# Patient Record
Sex: Male | Born: 1935 | Race: White | Hispanic: No | Smoking: Former smoker
Health system: Southern US, Community
[De-identification: ages and names within clinical notes are randomized; demographics above are authoritative.]

## PROBLEM LIST (undated history)

## (undated) DIAGNOSIS — Z86718 Personal history of other venous thrombosis and embolism: Secondary | ICD-10-CM

## (undated) DIAGNOSIS — I714 Abdominal aortic aneurysm, without rupture, unspecified: Secondary | ICD-10-CM

## (undated) DIAGNOSIS — Z7901 Long term (current) use of anticoagulants: Secondary | ICD-10-CM

## (undated) DIAGNOSIS — M1990 Unspecified osteoarthritis, unspecified site: Secondary | ICD-10-CM

## (undated) DIAGNOSIS — I639 Cerebral infarction, unspecified: Secondary | ICD-10-CM

## (undated) DIAGNOSIS — I4891 Unspecified atrial fibrillation: Secondary | ICD-10-CM

## (undated) DIAGNOSIS — I10 Essential (primary) hypertension: Secondary | ICD-10-CM

## (undated) DIAGNOSIS — K589 Irritable bowel syndrome without diarrhea: Secondary | ICD-10-CM

## (undated) DIAGNOSIS — D649 Anemia, unspecified: Secondary | ICD-10-CM

## (undated) DIAGNOSIS — G459 Transient cerebral ischemic attack, unspecified: Secondary | ICD-10-CM

## (undated) DIAGNOSIS — N200 Calculus of kidney: Secondary | ICD-10-CM

## (undated) DIAGNOSIS — I209 Angina pectoris, unspecified: Secondary | ICD-10-CM

## (undated) DIAGNOSIS — Z923 Personal history of irradiation: Secondary | ICD-10-CM

## (undated) DIAGNOSIS — Z860100 Personal history of colon polyps, unspecified: Secondary | ICD-10-CM

## (undated) DIAGNOSIS — I2510 Atherosclerotic heart disease of native coronary artery without angina pectoris: Secondary | ICD-10-CM

## (undated) DIAGNOSIS — Z8719 Personal history of other diseases of the digestive system: Secondary | ICD-10-CM

## (undated) DIAGNOSIS — N400 Enlarged prostate without lower urinary tract symptoms: Secondary | ICD-10-CM

## (undated) DIAGNOSIS — C439 Malignant melanoma of skin, unspecified: Secondary | ICD-10-CM

## (undated) DIAGNOSIS — F419 Anxiety disorder, unspecified: Secondary | ICD-10-CM

## (undated) DIAGNOSIS — Z8489 Family history of other specified conditions: Secondary | ICD-10-CM

## (undated) DIAGNOSIS — Z9981 Dependence on supplemental oxygen: Secondary | ICD-10-CM

## (undated) DIAGNOSIS — Y92009 Unspecified place in unspecified non-institutional (private) residence as the place of occurrence of the external cause: Secondary | ICD-10-CM

## (undated) DIAGNOSIS — J449 Chronic obstructive pulmonary disease, unspecified: Secondary | ICD-10-CM

## (undated) DIAGNOSIS — W19XXXA Unspecified fall, initial encounter: Secondary | ICD-10-CM

## (undated) DIAGNOSIS — C799 Secondary malignant neoplasm of unspecified site: Secondary | ICD-10-CM

## (undated) DIAGNOSIS — I509 Heart failure, unspecified: Secondary | ICD-10-CM

## (undated) DIAGNOSIS — K219 Gastro-esophageal reflux disease without esophagitis: Secondary | ICD-10-CM

## (undated) DIAGNOSIS — E785 Hyperlipidemia, unspecified: Secondary | ICD-10-CM

## (undated) DIAGNOSIS — IMO0002 Reserved for concepts with insufficient information to code with codable children: Secondary | ICD-10-CM

## (undated) HISTORY — DX: Hyperlipidemia, unspecified: E78.5

## (undated) HISTORY — DX: Reserved for concepts with insufficient information to code with codable children: IMO0002

## (undated) HISTORY — PX: LYMPH NODE BIOPSY: SHX201

## (undated) HISTORY — PX: JOINT REPLACEMENT: SHX530

## (undated) HISTORY — PX: MELANOMA EXCISION: SHX5266

## (undated) HISTORY — PX: SHOULDER OPEN ROTATOR CUFF REPAIR: SHX2407

## (undated) HISTORY — DX: Long term (current) use of anticoagulants: Z79.01

## (undated) HISTORY — DX: Heart failure, unspecified: I50.9

## (undated) HISTORY — DX: Anemia, unspecified: D64.9

## (undated) HISTORY — DX: Anxiety disorder, unspecified: F41.9

## (undated) HISTORY — DX: Essential (primary) hypertension: I10

## (undated) HISTORY — DX: Abdominal aortic aneurysm, without rupture, unspecified: I71.40

## (undated) HISTORY — PX: KIDNEY STONE SURGERY: SHX686

## (undated) HISTORY — DX: Irritable bowel syndrome, unspecified: K58.9

## (undated) HISTORY — DX: Benign prostatic hyperplasia without lower urinary tract symptoms: N40.0

## (undated) HISTORY — DX: Unspecified fall, initial encounter: W19.XXXA

## (undated) HISTORY — DX: Atherosclerotic heart disease of native coronary artery without angina pectoris: I25.10

## (undated) HISTORY — DX: Unspecified osteoarthritis, unspecified site: M19.90

## (undated) HISTORY — PX: CYSTOSCOPY W/ STONE MANIPULATION: SHX1427

## (undated) HISTORY — DX: Personal history of colonic polyps: Z86.010

## (undated) HISTORY — DX: Personal history of colon polyps, unspecified: Z86.0100

## (undated) HISTORY — DX: Unspecified atrial fibrillation: I48.91

## (undated) HISTORY — DX: Personal history of other venous thrombosis and embolism: Z86.718

## (undated) HISTORY — DX: Abdominal aortic aneurysm, without rupture: I71.4

## (undated) HISTORY — DX: Unspecified place in unspecified non-institutional (private) residence as the place of occurrence of the external cause: Y92.009

---
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
## 1971-04-09 HISTORY — PX: CHOLECYSTECTOMY: SHX55

## 1997-04-08 HISTORY — PX: CORONARY ARTERY BYPASS GRAFT: SHX141

## 1998-01-31 ENCOUNTER — Encounter: Payer: Self-pay | Admitting: Urology

## 1998-02-02 ENCOUNTER — Ambulatory Visit (HOSPITAL_COMMUNITY): Admission: RE | Admit: 1998-02-02 | Discharge: 1998-02-02 | Payer: Self-pay | Admitting: Urology

## 1998-02-02 ENCOUNTER — Encounter: Payer: Self-pay | Admitting: Urology

## 1998-02-02 ENCOUNTER — Inpatient Hospital Stay (HOSPITAL_COMMUNITY): Admission: EM | Admit: 1998-02-02 | Discharge: 1998-02-03 | Payer: Self-pay | Admitting: Emergency Medicine

## 1998-02-03 ENCOUNTER — Encounter: Payer: Self-pay | Admitting: Cardiology

## 1998-02-07 ENCOUNTER — Inpatient Hospital Stay (HOSPITAL_COMMUNITY): Admission: RE | Admit: 1998-02-07 | Discharge: 1998-02-15 | Payer: Self-pay

## 1998-02-10 ENCOUNTER — Encounter: Payer: Self-pay | Admitting: Surgery

## 1998-02-11 ENCOUNTER — Encounter: Payer: Self-pay | Admitting: Surgery

## 1998-02-12 ENCOUNTER — Encounter: Payer: Self-pay | Admitting: Surgery

## 1998-02-16 ENCOUNTER — Encounter: Payer: Self-pay | Admitting: Thoracic Surgery (Cardiothoracic Vascular Surgery)

## 1998-02-16 ENCOUNTER — Inpatient Hospital Stay (HOSPITAL_COMMUNITY): Admission: EM | Admit: 1998-02-16 | Discharge: 1998-02-21 | Payer: Self-pay | Admitting: Emergency Medicine

## 1998-06-15 ENCOUNTER — Encounter: Payer: Self-pay | Admitting: Urology

## 1998-06-15 ENCOUNTER — Ambulatory Visit (HOSPITAL_COMMUNITY): Admission: RE | Admit: 1998-06-15 | Discharge: 1998-06-15 | Payer: Self-pay | Admitting: Urology

## 1999-09-28 ENCOUNTER — Emergency Department (HOSPITAL_COMMUNITY): Admission: EM | Admit: 1999-09-28 | Discharge: 1999-09-28 | Payer: Self-pay | Admitting: Emergency Medicine

## 1999-09-28 ENCOUNTER — Encounter: Payer: Self-pay | Admitting: Emergency Medicine

## 2000-08-15 ENCOUNTER — Encounter: Payer: Self-pay | Admitting: Emergency Medicine

## 2000-08-15 ENCOUNTER — Emergency Department (HOSPITAL_COMMUNITY): Admission: EM | Admit: 2000-08-15 | Discharge: 2000-08-15 | Payer: Self-pay | Admitting: Emergency Medicine

## 2000-11-08 ENCOUNTER — Encounter: Payer: Self-pay | Admitting: *Deleted

## 2000-11-08 ENCOUNTER — Inpatient Hospital Stay (HOSPITAL_COMMUNITY): Admission: EM | Admit: 2000-11-08 | Discharge: 2000-11-10 | Payer: Self-pay | Admitting: *Deleted

## 2001-01-13 ENCOUNTER — Encounter: Admission: RE | Admit: 2001-01-13 | Discharge: 2001-01-13 | Payer: Self-pay | Admitting: Family Medicine

## 2001-01-13 ENCOUNTER — Encounter: Payer: Self-pay | Admitting: Family Medicine

## 2001-06-03 ENCOUNTER — Encounter (INDEPENDENT_AMBULATORY_CARE_PROVIDER_SITE_OTHER): Payer: Self-pay | Admitting: Specialist

## 2001-06-03 ENCOUNTER — Inpatient Hospital Stay (HOSPITAL_COMMUNITY): Admission: EM | Admit: 2001-06-03 | Discharge: 2001-06-06 | Payer: Self-pay | Admitting: *Deleted

## 2001-06-06 ENCOUNTER — Encounter: Payer: Self-pay | Admitting: Gastroenterology

## 2001-07-13 ENCOUNTER — Encounter: Admission: RE | Admit: 2001-07-13 | Discharge: 2001-07-13 | Payer: Self-pay | Admitting: Family Medicine

## 2001-07-13 ENCOUNTER — Encounter: Payer: Self-pay | Admitting: Family Medicine

## 2001-08-20 ENCOUNTER — Observation Stay (HOSPITAL_COMMUNITY): Admission: RE | Admit: 2001-08-20 | Discharge: 2001-08-21 | Payer: Self-pay | Admitting: Orthopedic Surgery

## 2002-05-09 ENCOUNTER — Emergency Department (HOSPITAL_COMMUNITY): Admission: EM | Admit: 2002-05-09 | Discharge: 2002-05-09 | Payer: Self-pay | Admitting: Emergency Medicine

## 2002-05-09 ENCOUNTER — Encounter: Payer: Self-pay | Admitting: Emergency Medicine

## 2003-01-11 ENCOUNTER — Ambulatory Visit (HOSPITAL_COMMUNITY): Admission: RE | Admit: 2003-01-11 | Discharge: 2003-01-11 | Payer: Self-pay | Admitting: Cardiology

## 2003-01-11 ENCOUNTER — Encounter (INDEPENDENT_AMBULATORY_CARE_PROVIDER_SITE_OTHER): Payer: Self-pay | Admitting: Cardiology

## 2003-07-15 ENCOUNTER — Emergency Department (HOSPITAL_COMMUNITY): Admission: EM | Admit: 2003-07-15 | Discharge: 2003-07-15 | Payer: Self-pay | Admitting: *Deleted

## 2004-08-17 ENCOUNTER — Encounter: Admission: RE | Admit: 2004-08-17 | Discharge: 2004-08-17 | Payer: Self-pay | Admitting: Family Medicine

## 2004-08-29 ENCOUNTER — Encounter: Admission: RE | Admit: 2004-08-29 | Discharge: 2004-08-29 | Payer: Self-pay | Admitting: Family Medicine

## 2004-09-20 ENCOUNTER — Encounter: Admission: RE | Admit: 2004-09-20 | Discharge: 2004-09-20 | Payer: Self-pay | Admitting: Family Medicine

## 2004-12-21 ENCOUNTER — Encounter: Admission: RE | Admit: 2004-12-21 | Discharge: 2004-12-21 | Payer: Self-pay | Admitting: Family Medicine

## 2005-02-24 ENCOUNTER — Emergency Department (HOSPITAL_COMMUNITY): Admission: EM | Admit: 2005-02-24 | Discharge: 2005-02-24 | Payer: Self-pay | Admitting: Emergency Medicine

## 2005-07-27 ENCOUNTER — Encounter: Admission: RE | Admit: 2005-07-27 | Discharge: 2005-07-27 | Payer: Self-pay | Admitting: Rheumatology

## 2006-06-12 ENCOUNTER — Ambulatory Visit (HOSPITAL_COMMUNITY): Admission: RE | Admit: 2006-06-12 | Discharge: 2006-06-12 | Payer: Self-pay | Admitting: Urology

## 2007-12-15 ENCOUNTER — Ambulatory Visit: Payer: Self-pay | Admitting: Gastroenterology

## 2007-12-15 ENCOUNTER — Encounter (INDEPENDENT_AMBULATORY_CARE_PROVIDER_SITE_OTHER): Payer: Self-pay | Admitting: *Deleted

## 2007-12-15 DIAGNOSIS — R159 Full incontinence of feces: Secondary | ICD-10-CM | POA: Insufficient documentation

## 2007-12-15 DIAGNOSIS — L259 Unspecified contact dermatitis, unspecified cause: Secondary | ICD-10-CM

## 2007-12-15 DIAGNOSIS — Z860100 Personal history of colon polyps, unspecified: Secondary | ICD-10-CM | POA: Insufficient documentation

## 2007-12-16 ENCOUNTER — Encounter: Payer: Self-pay | Admitting: Gastroenterology

## 2007-12-18 ENCOUNTER — Ambulatory Visit (HOSPITAL_COMMUNITY): Admission: RE | Admit: 2007-12-18 | Discharge: 2007-12-18 | Payer: Self-pay | Admitting: Gastroenterology

## 2007-12-23 ENCOUNTER — Encounter: Payer: Self-pay | Admitting: Gastroenterology

## 2007-12-23 ENCOUNTER — Ambulatory Visit: Payer: Self-pay | Admitting: Gastroenterology

## 2007-12-27 ENCOUNTER — Encounter: Payer: Self-pay | Admitting: Gastroenterology

## 2008-01-20 ENCOUNTER — Ambulatory Visit: Payer: Self-pay | Admitting: Gastroenterology

## 2008-08-01 ENCOUNTER — Ambulatory Visit: Payer: Self-pay | Admitting: Gastroenterology

## 2009-04-08 HISTORY — PX: ANKLE FUSION: SHX881

## 2009-04-08 HISTORY — PX: TIBIA FRACTURE SURGERY: SHX806

## 2010-02-07 ENCOUNTER — Observation Stay (HOSPITAL_COMMUNITY): Admission: RE | Admit: 2010-02-07 | Discharge: 2010-02-09 | Payer: Self-pay | Admitting: Orthopedic Surgery

## 2010-04-29 ENCOUNTER — Encounter: Payer: Self-pay | Admitting: Family Medicine

## 2010-06-19 LAB — PROTIME-INR
INR: 2.37 — ABNORMAL HIGH (ref 0.00–1.49)
INR: 2.61 — ABNORMAL HIGH (ref 0.00–1.49)
INR: 3 — ABNORMAL HIGH (ref 0.00–1.49)
Prothrombin Time: 26 seconds — ABNORMAL HIGH (ref 11.6–15.2)
Prothrombin Time: 28 seconds — ABNORMAL HIGH (ref 11.6–15.2)
Prothrombin Time: 31.2 seconds — ABNORMAL HIGH (ref 11.6–15.2)

## 2010-06-20 LAB — APTT: aPTT: 40 seconds — ABNORMAL HIGH (ref 24–37)

## 2010-06-20 LAB — COMPREHENSIVE METABOLIC PANEL
ALT: 16 U/L (ref 0–53)
AST: 22 U/L (ref 0–37)
Albumin: 4.2 g/dL (ref 3.5–5.2)
Alkaline Phosphatase: 65 U/L (ref 39–117)
BUN: 17 mg/dL (ref 6–23)
CO2: 32 mEq/L (ref 19–32)
Calcium: 10 mg/dL (ref 8.4–10.5)
Chloride: 98 mEq/L (ref 96–112)
Creatinine, Ser: 1.37 mg/dL (ref 0.4–1.5)
GFR calc Af Amer: >60 mL/min (ref >60)
GFR calc non Af Amer: 50 mL/min — ABNORMAL LOW (ref >60)
Glucose, Bld: 98 mg/dL (ref 70–99)
Potassium: 3.7 mEq/L (ref 3.5–5.1)
Sodium: 139 mEq/L (ref 135–145)
Total Bilirubin: 0.9 mg/dL (ref 0.3–1.2)
Total Protein: 6.8 g/dL (ref 6.0–8.3)

## 2010-06-20 LAB — CBC
HCT: 50.5 % (ref 39.0–52.0)
Hemoglobin: 17.8 g/dL — ABNORMAL HIGH (ref 13.0–17.0)
MCH: 33.9 pg (ref 26.0–34.0)
MCHC: 35.2 g/dL (ref 30.0–36.0)
MCV: 96.2 fL (ref 78.0–100.0)
Platelets: 173 10*3/uL (ref 150–400)
RBC: 5.25 MIL/uL (ref 4.22–5.81)
RDW: 14.4 % (ref 11.5–15.5)
WBC: 8 10*3/uL (ref 4.0–10.5)

## 2010-06-20 LAB — PROTIME-INR
INR: 2.2 — ABNORMAL HIGH (ref 0.00–1.49)
Prothrombin Time: 24.6 seconds — ABNORMAL HIGH (ref 11.6–15.2)

## 2010-06-20 LAB — SURGICAL PCR SCREEN
MRSA, PCR: NEGATIVE
Staphylococcus aureus: NEGATIVE

## 2010-08-24 NOTE — Discharge Summary (Signed)
. [facility]  Patient:    Gabriel Wood, Gabriel Wood                   MRN: [id number] Adm. Date:  [id number] Disc. Date: [id number] Attending:  Norman Clay CC:         Abran Cantor Clovis Riley, MD   Discharge Summary  FINAL DIAGNOSES: 1. Paroxysmal atrial fibrillation - resolved. 2. Sick sinus syndrome. 3. Coronary artery disease with previous coronary artery bypass grafting. 4. Hypertension. 5. Renal stones.  HISTORY OF PRESENT ILLNESS:  The patient is a 76 year old male with known coronary artery disease and previous paroxysmal atrial fibrillation.  He presented to the emergency room with anterior chest pain and irregular heartbeat.  When in the emergency room he converted to normal sinus rhythm after a very prolonged pause, he was severely bradycardic with some junctional rhythm following that.  This episode had its onset about two hours after he had taken his dosages of Toprol and Lanoxin.  HOSPITAL COURSE:  Lab data on admission showed normal CPK and troponins and negative MBs.  Hemoglobin is 17 and hematocrit 49.3.  Potassium is 3.6, BUN 18, creatinine 1.4.  TSH is 1.439, digoxin level 0.5.  12-lead ECG showed atrial fibrillation, nonspecific changes on admission.  Repeat EKG showed sinus bradycardia and first degree AV block.  The patient was admitted and his Toprol and Lanoxin were withheld.  He was begun on Coumadin because of his atrial fibrillation and will be titrated as an outpatient to maintain an INR of between 2 and 3.  His heartrate was monitored and it did come up into the 50s off of medication.  It is thought that he probably also has some sick sinus syndrome.  He is discharged in improved condition on Lipitor 20 mg daily, Norvasc 5 mg daily, Maxzide 25 one daily, baby aspirin daily, and Coumadin 5 mg daily.  He is to be seen for an appointment for a pro-time on Friday and is to have weekly pro times until his INR was between 2  and 3.  If he develops recurrent atrial fibrillation in the future, he possibly could need pacemaker placement in order to give antiarrhythmics to give atrial fibrillation. DD:  11/10/00 TD:  11/12/00 Job: [id number]

## 2010-08-24 NOTE — Discharge Summary (Signed)
[place]. [facility]  Patient:    Gabriel Wood, Gabriel Wood Visit Number: [id number] MRN: [id number]          Service Type: MED Location: [phone] Attending Physician:  Norman Clay Dictated by:   Darden Palmer., M.D. Admit Date:  06/03/2001 Discharge Date: 06/06/2001   CC:         Dyanne Carrel, M.D.   Discharge Summary  FINAL DIAGNOSES: 1. Chest pain, possible unstable angina. 2. Known coronary artery disease with previous coronary bypass grafting    atretic mammary artery graft. 3. Atrial fibrillation in sinus rhythm. 4. Slow gastrointestinal bleed. 5. Hypertension. 6. Hyperlipidemia.  PROCEDURES:  Cardiac catheterization.  HISTORY:  The patient presented to the emergency room with chest discomfort of 25 to 30 minutes associated with heaviness.  He has been somewhat weak, nauseated, and bloated, and his hemoglobin was low and Coumadin was stopped. He was admitted to the hospital for further evaluation.  Please see the previously dictated History & Physical for the remainder of the details.  HOSPITAL COURSE:  Lab data showed a hemoglobin of 14.1, hematocrit 42.1. Sodium was 137, potassium 3.5.  PT and PTT were normal.  CPK and troponins were normal.  Urinalysis was unremarkable.  The patients EKG showed no acute disease.  He denied chest pain but had some diaphoresis and exertional dyspnea, significant bloating, and vertigo. Catheterization was advised today with grafts since he had a mildly abnormal Cardiolite previously.  A GI consult was also planned.  The GI consult recommended he have an elective GI workup following catheterization findings.  Cardiac catheterization performed on June 05, 2001, showed normal left ventricular function with increased LVEDP.  Left main was normal.  The LAD had a 95% stenosis proximal to the diagonal with bidirectional distal flow.  The circumflex had an occluded second  marginal.  The right coronary artery had an ostial 70%, was occluded in the mid portion.  The vein graft to the right coronary artery was patent.  The vein graft to the OM-2 was patent with some disease.  The saphenous vein graft to the diagonal was patent with competitive flow into the LAD.  Catheterization findings were reviewed with the patient. He had lost the LIMA graft to the LAD but had good flow through the saphenous vein graft to the diagonal, and it was recommended that he be evaluated with a GI workup.  GI workup did colonoscopy finding two rectal polyps, one friable and the likely source for the heme-positive stools, and two small sigmoid polyps.  EGD was negative.  Plan was to discharge him home, and he went home on June 06, 2001, with hemoccults followed up in one month.  Pathology on the polyps showed an adenomatous polyp in the rectum.  Sigmoid polyps were also adenomatous, and followup was to be in one year.  CONDITION UPON DISCHARGE:  He was discharged home in improved condition.  DISCHARGE MEDICATIONS: 1. Lipitor 20 mg daily. 2. Maxzide 25 daily. 3. Norvasc 5 mg daily. 4. Protonix 40 mg daily.  FOLLOWUP:  He was to see me in the office in one week.  He was to call to get an appointment with Dr. Arlyce Dice in three to four weeks.  He was to call if there were problems.  He was not to use aspirin for two weeks. Dictated by:   Darden Palmer., M.D. Attending Physician:  Norman Clay DD:  06/29/01 TD:  06/30/01 Job:  [id number]

## 2010-08-24 NOTE — H&P (Signed)
[place]. [facility]  Patient:    Gabriel Wood, Gabriel Wood                   MRN: [id number] Adm. Date:  [id number] Attending:  Norman Clay CC:         Abran Cantor Clovis Riley, M.D.   History and Physical  REASON FOR ADMISSION:  Chest discomfort and palpitations.  HISTORY OF PRESENT ILLNESS:  This 77 year old man was admitted for treatment of atrial fibrillation and sick sinus syndrome.  The patient has a previous history of coronary artery disease.  He developed unstable angina pectoris after a lithotripsy and was found to have three-vessel coronary artery disease at catheterization with a 90% LAD, 90% circumflex, and 70% ostial right coronary artery and 60% mid vessel stenosis.  He underwent coronary artery bypass grafting by Dr. Evelene Croon on February 10, 1998, with an internal mammary graft to the LAD, a vein raft to the diagonal branch, a vein graft to the obtuse marginal, and a vein graft to the posterior descending coronary artery. This was after a positive adenosine scan.  Postoperatively, he developed recurrent atrial fibrillation and had to be readmitted on February 16, 1998.  He had failed procainamide and was eventually discharged on Betapace and Coumadin and was able to convert back to normal sinus rhythm. He was on Betapace at that time.  His Betapace was increased to 80 mg 3 times a day, and he was taken off of Betapace around March 2000.  He had had some PVCs and some paroxysmal atrial fibrillation in the past but had improved with that.  He had recently developed some dizziness, memory loss, and was seen by Dr. Manus Gunning or Dr. Clovis Riley on May 13.  He, while driving back to [facility], presented to the emergency room where he had some chest discomfort and numbness and and was started on Norvasc, and he felt well.  An adenosine Cardiolite scan performed on Aug 29, 2000, showed a very small focus of distal anterolateral ischemia with a  normal ejection fraction of 60% and was felt to be a low-risk scan.  He has really been asymptomatic since then.  He had felt well recently and had gone out to play golf this morning.  After returning from a golf match, he had taken his Toprol and digoxin at lunch while sitting in a chair.  He then developed a feeling of flutter in his chest and some pressure-type sensation and was brought to the emergency room.  He was found to be in atrial fibrillation here.  He spontaneously converted to normal sinus rhythm but in the process had a prolonged sinus pause and then some junctional rhythm prior to resuming sinus rhythm.  At the present time he feels well.  He does note some episodic irregularity in his heart beat.  PAST MEDICAL HISTORY:  Remarkable for hypertension.  He has been placed on Norvasc and beta blockers for this.  He has a prior history of irritable bowel syndrome and recurrent renal stones and has had several lithotripsies.  PAST SURGICAL HISTORY:  Cholecystectomy, several operations for renal stones, tonsillectomy, appendectomy, and rotator cuff surgery.  ALLERGIES:  CODEINE:  CURRENT MEDICATIONS: 1. He was on aspirin daily. 2. Lanoxin 0.125 mg daily. 3. Toprol XL 50 mg daily. 4. Lipitor 20 mg daily. 5. Maxzide 25 daily. 6. Norvasc 5 mg daily prior to admission.  FAMILY HISTORY:  Remarkable for cardiac disease.  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  He smoked heavily and quit smoking in December 1998. He drank heavily intermittently but has not had significant alcohol in over 10 years.  REVIEW OF SYSTEMS:  Significant for renal stones and significant arthritis. He has some nocturia and some complaints of irritable bowel syndrome and episodic diarrhea.  The remainder of the Review of Systems is unremarkable except as noted above.  PHYSICAL EXAMINATION:  GENERAL:  A very pleasant, slightly erythematous,  red-faced male in no acute distress.  VITAL SIGNS:  Blood pressure 135/80, pulse 45 and regular.  He was in sinus bradycardia when he was seen.  SKIN:  Warm and dry.  ENT:  EOMI.  PERRLA.  Conjunctivae and sclerae clear.  Pharynx negative.  NECK:  Supple without masses.  No carotid bruits.  LUNGS:  Clear.  CARDIAC:  Normal S1 and S2, no S3.  LYMPH NODES:  Negative.  ABDOMEN:  Obese, soft, and nontender.  EXTREMITIES:  There is no edema noted.  Peripheral pulses were present and were 2+.  LABORATORY DATA:  A 12-lead ECG showed atrial fibrillation with nonspecific ST and T wave changes.  A repeat EKG showed a junctional rhythm at a rate of 42. He subsequently has been noted to be in normal sinus rhythm.  Initial cardiac enzymes were unremarkable.  IMPRESSION: 1. Paroxysmal atrial fibrillation, resolved, with evidence of sick sinus    syndrome. 2. Coronary artery disease with previous coronary bypass grafting. 3. Hypertension.  RECOMMENDATIONS:  Brought in to monitor bradycardia.  His Toprol and digoxin will be withheld, and he will have an MI ruled out.  He may need to go on Coumadin therapy because of the recurrent paroxysmal atrial fibrillation and may need additional agents for suppression.  He has previously been on Betapace and had failed procainamide in the past. DD:  11/08/00 TD:  11/08/00 Job: [id number]

## 2010-08-24 NOTE — H&P (Signed)
[place]. [facility]  Patient:    Gabriel Wood, Gabriel Wood Visit Number: [id number] MRN: [id number]          Service Type: MED Location: [phone] Attending Physician:  Norman Clay Dictated by:   Jennet Maduro Earl Gala, R.N., A.N.P. Admit Date:  06/03/2001   CC:         Dr. Gaylan Gerold   History and Physical  CHIEF COMPLAINT:  Chest pain.  HISTORY OF PRESENT ILLNESS:  Gabriel Wood is a 77 year old white male who presents to the emergency room department today with an onset of chest discomfort.  He notes that earlier this day he had had an approximate 25 to 30 minute duration of chest discomfort.  It was midsternal in nature.  It was described as a heavy like feeling.  It lasted for approximately 25 minutes. There were no other associated symptoms but over the past 2-1/2 weeks he had not felt well.  He has been quite weak, nauseated and bloated.  He saw his primary care Joshue Badal last week and there was some concern that his hemoglobin was low and his stools were heme-positive.  His Coumadin was stopped.  He was apparently supposed to see Dr. Russella Dar for GI evaluation today but his office had called him last night and the appointment had been rescheduled. He is currently pain free in the emergency room department.  PAST MEDICAL HISTORY: 1. Atherosclerotic cardiovascular disease, previous coronary artery bypass    grafting x 4 in 1999 with left internal mammary to the LAD, saphenous vein    graft to the OM, saphenous vein graft to the diagonal and saphenous vein    graft to the posterior descending.  His cardiolite was in May of 2002,    which was basically a low risk scan.  There was noted to be mildly abnormal    with a very small focus of distal anterolateral ischemia.  The area    involved was a small segment and the quantitative gaited spect was 60% with    normal wall motion and normal wall thickening. 2. Hypertension. 3. Postoperative atrial  fibrillation previously on procainamide as well as    betapace. 4. Chronic Coumadin therapy. 5. Nephrolithiasis. 6. Questionable recent anemia due to possible GI blood loss. 7. Hyperlipidemia. 8. Irritable bowel syndrome. 9. Status post cholecystectomy, tonsillectomy appendectomy and a rotator cuff    surgery.  ALLERGIES:  CODEINE.  INTOLERANCES:  MORPHINE.  CURRENT MEDICATIONS: 1. Coumadin 7.5 x 3, 5.0 x 4. 2. Lipitor 20 mg a day. 3. Maxzide 25 mg a day. 4. Norvasc 5 mg a day. 5. Baby aspirin daily.  FAMILY HISTORY:  Positive for coronary disease.  SOCIAL HISTORY:  He is married.  He still is employed in Fish farm manager. He is a remote smoker and has remote use of alcohol.  REVIEW OF SYSTEMS:  He has had no fever, no chills, no cough.  He has had some bloating, and nausea and a generalized feeling of weakness.  He has noted no black tarry stools.  He really has had no reoccurrence of his atrial fibrillation to his awareness and otherwise review of systems is unremarkable.  PHYSICAL EXAMINATION:  VITAL SIGNS:  Blood pressure 129/76, heart rate is 71, respirations 18.  He is afebrile.  SKIN:  Warm and dry.  Color is unremarkable.  NECK:  Supple.  LUNGS:  Clear.  HEART:  Shows a regular rhythm.  ABDOMEN:  Soft with positive bowel sounds, nontender without masses.  EXTREMITIES:  Distal pulses to be intact.  There is no evidence of edema.  NEUROLOGIC:  Intact.  RECTAL:  Performed by Dr. Tacy Dura. The prostate is noted to be enlarged and the stool is heme-positive.  PERTINENT LABORATORY DATA:  Shows a hematocrit of 42, white count 6, platelets 233, chemistries are all within normal limits.  First CK and troponin are negative.  EKG shows a first degree AV block with no acute changes.  IMPRESSION: 1. Chest pain. 2. Known coronary disease with previous coronary artery bypass grafting with a    previous low risk cardiolite scan as of May 2002. 3. Previous  atrial fibrillation currently maintaining sinus rhythm. 4. Probable slow GI bleed currently with heme-positive stools and stable    hematocrit. 5. Hypertension. 6. Hyperlipidemia . 7. Chronic Coumadin therapy.  PLAN:  He will be admitted to the service of Dr. Viann Fish and to the rule out unit.  Cardiac enzymes will be checked and his home medicines will be continued.  Will not use further anticoagulants at this point in time.  His lab will be followed closely.  Will more than likely proceed on with a GI evaluation once cleared by cardiology. Dictated by:   Jennet Maduro Earl Gala, R.N., A.N.P. Attending Physician:  Norman Clay DD:  06/03/01 TD:  06/03/01 Job: [id number]

## 2010-08-24 NOTE — Cardiovascular Report (Signed)
[place]. [facility]  Patient:    [name], [name] Visit Number: [id number] MRN: [id number]          Service Type: MED Location: [phone] Attending Physician:  Norman Clay Dictated by:   Darden Palmer., M.D. Proc. Date: 06/05/01 Admit Date:  06/03/2001   CC:         Dyanne Carrel, M.D.   Cardiac Catheterization  INDICATIONS:  Unstable angina in a patient with previous bypass grafting.  COMMENTS ABOUT PROCEDURE:  The patient tolerated the procedure well without complications.  The right coronary artery was bypassed selectively using a RCB catheter.  The remainder of the saphenous grafts were resected using the standard right catheter.  The internal mammary was atretic and was best visualized with the internal mammary artery catheter.  Please see the attached catheterization log for remainder of the details.  HEMODYNAMIC DATA: 1. Aortic pressure:  Post-contrast 156/68. 2. Left ventricular pressure:  Post-contrast 156/17-22.  ANGIOGRAPHIC DATA:  LEFT VENTRICULOGRAM:  Performed in the 30-degree RAO projection.  Aortic valve was normal.  The mitral valve appears normal.  The left ventricle appears normal in size; there is no wall motion abnormality noted.  The estimated ejection fraction was 65-70%.  CORONARY ANGIOGRAPHY:  The coronary arteries arise and distribute normally. Significant calcification noted in the proximal left coronary system. 1. LEFT MAIN CORONARY ARTERY:  Appears normal. 2. LEFT ANTERIOR DESCENDING ARTERY:  Has mild irregular proximally.  There is    a severe eccentric 95% proximal stenosis prior to the diagonal branch.    There is bidirectional flow into the distal LAD. 3. CIRCUMFLEX CORONARY ARTERY:  A codominant vessel, and supplies a series of    several small marginal branches.  A distal posterolateral branch is    completely occluded and fills via patent graft. 4. RIGHT CORONARY ARTERY:   Has an ostial 60-70% stenosis and is occluded in    its mid portion; it is a somewhat small vessel. 5. GRAFTS:    a. Saphenous vein graft to the diagonal branch is widely patent.  The       diagonal branch fills the left anterior descending retrograde and       antegrade; there is competitive flow into the distal LAD.    b. Saphenous vein graft to the posterolateral branch is widely patent.       There is moderate irregularity within the body of the graft.    c. Saphenous vein graft to the right coronary artery is patent.  There is       a segmental 40% proximal stenosis with moderate irregularity in the       body of the graft.    d. Internal mammary graft to the LAD is atretic.  IMPRESSION: 1. Atretic internal mammary graft to the left anterior descending.  The    distal left anterior descending circulation fills through antegrade flow    as well as by competitive flow through the saphenous vein graft to the    diagonal. 2. Patent grafts to the right coronary artery and circumflex marginal branch. 3. Normal left ventricular function. 4. Severe native three-vessel coronary artery disease. Dictated by:   Darden Palmer., M.D. Attending Physician:  Norman Clay DD:  06/05/01 TD:  06/05/01 Job: [id number]

## 2010-08-24 NOTE — Op Note (Signed)
[facility]  Patient:    Gabriel Wood, Gabriel Wood Visit Number: [id number] MRN: [id number]          Service Type: SUR Location: [phone] Attending Physician:  Skip Mayer Dictated by:   Georges Lynch Darrelyn Hillock, M.D. Proc. Date: 08/20/01 Admit Date:  08/20/2001                             Operative Report  SURGEON:  Windy Fast A. Darrelyn Hillock, M.D.  ASSISTANT:  Della Goo, P.A.-C.  PREOPERATIVE DIAGNOSIS:  Complete tear with severe impingement syndrome of the left rotator cuff  POSTOPERATIVE DIAGNOSIS:  Complete tear with severe improvement syndrome of the left rotator cuff.  OPERATION: 1. Partial acromionectomy and acromioplasty of the left shoulder. 2. Repair of the rotator cuff tendon utilizing a graft-jacket graft to the    repair site.  DESCRIPTION OF PROCEDURE:  Under general anesthesia, a routine orthopedic prep and draping of the left shoulder was carried out.  The patient had 1 g of IV Ancef preop.  An incision was made over the anterior aspect of the left shoulder.  Bleeders were identified and cauterized.  I detached the deltoid tendon from the acromion in the usual fashion.  I then protected the underlying cuff that was remaining with a Bennett retractor and did an acromionectomy with the oscillating saw and an acromioplasty with the bur. Note, his rotator cuff was completely torn.  It was degenerated.  I thoroughly irrigated out the area.  I burred the lateral articular surface of the humerus with the bur.  I then utilized a 5 x 5 graft-jacket to utilize as my graft to carry out the repair.  I then anchored it with two metal anchors.  They were multitack anchors to the proximal humerus.  I thoroughly irrigated out the area, closed the wound in layers in the usual fashion after reapproximating the deltoid tendon and muscles.  Sterile dressings were applied.  He was was placed in a shoulder immobilizer. Dictated by:   Georges Lynch  Darrelyn Hillock, M.D. Attending Physician:  Skip Mayer DD:  08/20/01 TD:  08/21/01 Job: [id number]

## 2010-12-28 ENCOUNTER — Ambulatory Visit (INDEPENDENT_AMBULATORY_CARE_PROVIDER_SITE_OTHER): Payer: Medicare Other

## 2010-12-28 DIAGNOSIS — I713 Abdominal aortic aneurysm, ruptured: Secondary | ICD-10-CM

## 2010-12-31 ENCOUNTER — Encounter: Payer: Self-pay | Admitting: Surgery

## 2011-01-07 NOTE — Procedures (Unsigned)
DUPLEX ULTRASOUND OF ABDOMINAL AORTA  INDICATION:  Abdominal aortic aneurysm; previous MR of the lumbar spine performed on 12/18/2007 revealed considerable tortuosity of the abdominal aorta along with abdominal aortic ectasia.  HISTORY: Diabetes:  No. Cardiac: Hypertension:  Yes. Smoking:  Previously quit in 1998. Family History:  No previous family history of aneurysm. Previous Surgery:  CABG x4 in 1999.  DUPLEX EXAM:         AP (cm)                   TRANSVERSE (cm) Proximal             2.07 cm                   1.88 cm Mid                  2.45 cm                   2.61 cm Distal               2.54 cm                   2.62 cm Right Iliac          1.27 cm                   1.27 cm Left Iliac           1.18 cm                   1.21 cm  PREVIOUS:  Date:  AP:  TRANSVERSE:  IMPRESSION: 1. Aneurysmal dilatation of the abdominal aorta measuring 2.54 cm AP X     2.62 cm transverse. 2. Tortuous aorta with calcified shadowing plaque throughout.  ___________________________________________ V. Charlena Cross, MD  CI/MEDQ  D:  12/28/2010  T:  12/28/2010  Job:  [id number]

## 2011-01-11 ENCOUNTER — Encounter: Payer: Self-pay | Admitting: Surgery

## 2011-01-21 ENCOUNTER — Other Ambulatory Visit: Payer: Self-pay | Admitting: *Deleted

## 2011-01-21 ENCOUNTER — Encounter: Payer: Self-pay | Admitting: Surgery

## 2011-01-21 DIAGNOSIS — I714 Abdominal aortic aneurysm, without rupture: Secondary | ICD-10-CM

## 2011-01-21 DIAGNOSIS — Z01818 Encounter for other preprocedural examination: Secondary | ICD-10-CM

## 2011-01-22 ENCOUNTER — Encounter: Payer: Self-pay | Admitting: Vascular Surgery

## 2011-01-25 ENCOUNTER — Encounter: Payer: Self-pay | Admitting: Vascular Surgery

## 2011-01-25 ENCOUNTER — Other Ambulatory Visit: Payer: PRIVATE HEALTH INSURANCE

## 2011-01-30 ENCOUNTER — Other Ambulatory Visit: Payer: PRIVATE HEALTH INSURANCE

## 2011-03-06 ENCOUNTER — Other Ambulatory Visit: Payer: Self-pay | Admitting: Surgery

## 2011-03-06 LAB — BUN: BUN: 16 mg/dL (ref 6–23)

## 2011-03-06 LAB — CREATININE, SERUM: Creat: 1.19 mg/dL (ref 0.50–1.35)

## 2011-03-11 ENCOUNTER — Ambulatory Visit
Admission: RE | Admit: 2011-03-11 | Discharge: 2011-03-11 | Disposition: A | Discharge status: Home / Self Care | Payer: Medicare Other | Source: Ambulatory Visit | Attending: Surgery | Admitting: Surgery

## 2011-03-11 ENCOUNTER — Ambulatory Visit (INDEPENDENT_AMBULATORY_CARE_PROVIDER_SITE_OTHER): Payer: Medicare Other | Admitting: Surgery

## 2011-03-11 ENCOUNTER — Encounter: Payer: Self-pay | Admitting: Surgery

## 2011-03-11 ENCOUNTER — Ambulatory Visit
Admission: RE | Admit: 2011-03-11 | Discharge: 2011-03-11 | Disposition: A | Discharge status: Home / Self Care | Payer: PRIVATE HEALTH INSURANCE | Source: Ambulatory Visit | Attending: Surgery | Admitting: Surgery

## 2011-03-11 VITALS — BP 117/70 | HR 76 | Resp 16 | Ht 70.0 in | Wt 196.0 lb

## 2011-03-11 DIAGNOSIS — I714 Abdominal aortic aneurysm, without rupture: Secondary | ICD-10-CM

## 2011-03-11 MED ORDER — IOHEXOL 350 MG/ML SOLN
100.0000 mL | Freq: Once | INTRAVENOUS | Status: AC | PRN
  Administered 2011-03-11: 100 mL via INTRAVENOUS

## 2011-03-11 NOTE — Progress Notes (Signed)
Vascular and [facility]in Specialist of [facility]   Patient name: Gabriel Wood MRN: [id number] DOB: 10-18-1923 Sex: male   Referred by: Dr Lajoyce Corners  Reason for referral:  Chief Complaint  Patient presents with  . New Evaluation    REF-->> Dr. Lajoyce Corners,   Pt had CTA today  . AAA    HISTORY OF PRESENT ILLNESS: The patient is seen today as a new patient for evaluation of an abdominal aortic aneurysm. He had lumbar x-rays evaluating his back and they saw was thought to be a 6 cm aneurysm. Ultrasound in my office shortly thereafter approximately 2 months ago revealed a 3 cm aneurysm and a tortuous aorta. I sent him for a CT angiogram for definitive evaluation of his aneurysm. He denies abdominal pain. He denies acute low back pain.  The patient is medically managed for his hypertension and hypercholesterolemia. He takes Coumadin for atrial fibrillation. He also suffers from coronary artery disease and has undergone coronary artery bypass grafting in the past.  Past Medical History  Diagnosis Date  . History of colon polyps   . Anemia   . Chronic kidney disease     History of Kidney Stones  . Ulcer     history of Peptic Ulcer Disease  . IBS (irritable bowel syndrome)   . Anxiety   . BPH (benign prostatic hypertrophy)   . CAD (coronary artery disease)   . Atrial fibrillation   . AAA (abdominal aortic aneurysm)   . Arthritis   . Chronic anticoagulation     On Coumadin therapy ( A Fib)  . Hypertension   . Hyperlipidemia   . History of DVT of lower extremity     Past Surgical History  Procedure Date  . Appendectomy   . Cholecystectomy   . Tonsillectomy   . Rotator cuff repair     Right shoulder  . Coronary artery bypass graft   . Joint replacement     Left shoulder replacement  . Appendectomy   . Fracture surgery 2011    Left tibial ORIF by Dr. Lajoyce Corners  . Ankle fusion 2011    Left ankle    History   Social History  . Marital Status: Married    Spouse Name: N/A    Number of  Children: N/A  . Years of Education: N/A   Occupational History  . Not on file.   Social History Main Topics  . Smoking status: Former Smoker    Quit date: 04/08/1996  . Smokeless tobacco: Not on file  . Alcohol Use: No     heavy alcohol use in the past  . Drug Use: No  . Sexually Active:    Other Topics Concern  . Not on file   Social History Narrative  . No narrative on file    Family History  Problem Relation Age of Onset  . Cancer Mother     stomach cancer  . Heart disease Brother     Allergies as of 03/11/2011 - Review Complete 03/11/2011  Allergen Reaction Noted  . Codeine      Current Outpatient Prescriptions on File Prior to Visit  Medication Sig Dispense Refill  . aspirin EC 81 MG tablet Take 81 mg by mouth daily.        . Triamterene-HCTZ (MAXZIDE-25 PO) Take 25 mg by mouth daily.        Marland Kitchen warfarin (COUMADIN) 7.5 MG tablet Take 7.5 mg by mouth daily.        Marland Kitchen amLODipine (NORVASC)  5 MG tablet Take 5 mg by mouth daily.         No current facility-administered medications on file prior to visit.     REVIEW OF SYSTEMS: Cardiovascular: No chest pain, chest pressure, palpitations, orthopnea, or dyspnea on exertion. No claudication or rest pain,  Pulmonary: No productive cough, asthma or wheezing. Neurologic: No weakness, paresthesias, aphasia, or amaurosis. No dizziness. Hematologic: No bleeding problems or clotting disorders. Musculoskeletal: No joint pain or joint swelling. Gastrointestinal: No blood in stool or hematemesis Genitourinary: No dysuria or hematuria. Psychiatric:: No history of major depression. Integumentary: No rashes or ulcers. Constitutional: No fever or chills.  PHYSICAL EXAMINATION: General: The patient appears their stated age.  Vital signs are BP 117/70  Pulse 76  Resp 16  Ht 5\' 10"  (1.778 m)  Wt 196 lb (88.905 kg)  BMI 28.12 kg/m2  SpO2 94% HEENT:  No gross abnormalities Pulmonary: Respirations are non-labored Abdomen: Soft  and non-tender.  nontender aorta Musculoskeletal: There are no major deformities.   Neurologic: No focal weakness or paresthesias are detected, Skin: There are no ulcer or rashes noted. Psychiatric: The patient has normal affect. Cardiovascular: There is a regular rate and rhythm without significant murmur appreciated. No carotid bruits palpable femoral pulses bilaterally pedal pulses are not palpable but both feet are warm and well perfused  Diagnostic Studies: CT angiogram has been reviewed this reveals a focal pseudoaneurysm in the mid abdominal aorta measuring 3.5 x 2.5 cm   Assessment:  Abdominal aortic aneurysm Plan: Based on the size of the patient's aneurysm I think he is at very low risk for her at its current size. The maximum diameter is 3.5 cm. I told that I recommended continued surveillance. I'll plan on having him get an ultrasound in one year for further followup.     Jorge Ny, M.D. [facility] [place] Office: [phone] Pager:  [phone]

## 2011-06-18 ENCOUNTER — Other Ambulatory Visit: Payer: Self-pay | Admitting: Cardiology

## 2011-07-10 ENCOUNTER — Other Ambulatory Visit: Payer: Self-pay | Admitting: Cardiology

## 2011-07-30 ENCOUNTER — Other Ambulatory Visit: Payer: Self-pay | Admitting: Cardiology

## 2011-12-08 DIAGNOSIS — C439 Malignant melanoma of skin, unspecified: Secondary | ICD-10-CM

## 2011-12-08 HISTORY — DX: Malignant melanoma of skin, unspecified: C43.9

## 2012-03-13 ENCOUNTER — Encounter: Payer: Self-pay | Admitting: Neurosurgery

## 2012-03-13 ENCOUNTER — Other Ambulatory Visit: Payer: Self-pay | Admitting: *Deleted

## 2012-03-13 DIAGNOSIS — I714 Abdominal aortic aneurysm, without rupture: Secondary | ICD-10-CM

## 2012-03-16 ENCOUNTER — Encounter: Payer: Self-pay | Admitting: Neurosurgery

## 2012-03-16 ENCOUNTER — Encounter (INDEPENDENT_AMBULATORY_CARE_PROVIDER_SITE_OTHER): Payer: Medicare Other | Admitting: *Deleted

## 2012-03-16 ENCOUNTER — Ambulatory Visit (INDEPENDENT_AMBULATORY_CARE_PROVIDER_SITE_OTHER): Payer: Medicare Other | Admitting: Neurosurgery

## 2012-03-16 VITALS — BP 124/84 | HR 73 | Resp 16 | Ht 70.0 in | Wt 194.5 lb

## 2012-03-16 DIAGNOSIS — I714 Abdominal aortic aneurysm, without rupture: Secondary | ICD-10-CM

## 2012-03-16 NOTE — Progress Notes (Signed)
[facility] OF [place] AAA/Carotid Office Note  CC: AAA surveillance Referring Physician: Brabham  History of Present Illness: 78 year old male patient of Dr. Myra Gianotti followed for known small AAA. The patient denies any unusual abdominal or back pain. The patient denies any new medical diagnoses or recent surgery.  Past Medical History  Diagnosis Date  . History of colon polyps   . Anemia   . Chronic kidney disease     History of Kidney Stones  . Ulcer     history of Peptic Ulcer Disease  . IBS (irritable bowel syndrome)   . Anxiety   . BPH (benign prostatic hypertrophy)   . CAD (coronary artery disease)   . Atrial fibrillation   . AAA (abdominal aortic aneurysm)   . Arthritis   . Chronic anticoagulation     On Coumadin therapy ( A Fib)  . Hypertension   . Hyperlipidemia   . History of DVT of lower extremity   . Cancer Sept. 2013    Melanoma,Right  posterior shoulder    ROS: [x]  Positive   [ ]  Denies    General: [ ]  Weight loss, [ ]  Fever, [ ]  chills Neurologic: [ ]  Dizziness, [ ]  Blackouts, [ ]  Seizure [ ]  Stroke, [ ]  "Mini stroke", [ ]  Slurred speech, [ ]  Temporary blindness; [ ]  weakness in arms or legs, [ ]  Hoarseness Cardiac: [ ]  Chest pain/pressure, [ ]  Shortness of breath at rest [ ]  Shortness of breath with exertion, [ ]  Atrial fibrillation or irregular heartbeat Vascular: [ ]  Pain in legs with walking, [ ]  Pain in legs at rest, [ ]  Pain in legs at night,  [ ]  Non-healing ulcer, [ ]  Blood clot in vein/DVT,   Pulmonary: [ ]  Home oxygen, [ ]  Productive cough, [ ]  Coughing up blood, [ ]  Asthma,  [ ]  Wheezing Musculoskeletal:  [ ]  Arthritis, [ ]  Low back pain, [ ]  Joint pain Hematologic: [ ]  Easy Bruising, [ ]  Anemia; [ ]  Hepatitis Gastrointestinal: [ ]  Blood in stool, [ ]  Gastroesophageal Reflux/heartburn, [ ]  Trouble swallowing Urinary: [ ]  chronic Kidney disease, [ ]  on HD - [ ]  MWF or [ ]  TTHS, [ ]  Burning with urination, [ ]  Difficulty  urinating Skin: [ ]  Rashes, [ ]  Wounds Psychological: [ ]  Anxiety, [ ]  Depression   Social History History  Substance Use Topics  . Smoking status: Former Smoker    Quit date: 04/08/1996  . Smokeless tobacco: Not on file  . Alcohol Use: No     Comment: heavy alcohol use in the past    Family History Family History  Problem Relation Age of Onset  . Cancer Mother     stomach cancer  . Heart disease Brother   . Heart disease Brother   . Heart disease Brother     Allergies  Allergen Reactions  . Morphine And Related Palpitations  . Codeine Nausea Only    Current Outpatient Prescriptions  Medication Sig Dispense Refill  . amLODipine (NORVASC) 5 MG tablet Take 5 mg by mouth daily.        Marland Kitchen aspirin EC 81 MG tablet Take 81 mg by mouth daily.        Marland Kitchen atorvastatin (LIPITOR) 40 MG tablet daily.       . citalopram (CELEXA) 10 MG tablet daily.       . metoprolol succinate (TOPROL-XL) 25 MG 24 hr tablet       . Triamterene-HCTZ (MAXZIDE-25 PO) Take  25 mg by mouth daily.        Marland Kitchen warfarin (COUMADIN) 5 MG tablet as directed.       . warfarin (COUMADIN) 7.5 MG tablet Take 6.5 mg by mouth daily.         Physical Examination  Filed Vitals:   03/16/12 0929  BP: 124/84  Pulse: 73  Resp: 16    Body mass index is 27.91 kg/(m^2).  General:  WDWN in NAD Gait: Normal HEENT: WNL Eyes: Pupils equal Pulmonary: normal non-labored breathing , without Rales, rhonchi,  wheezing Cardiac: RRR, without  Murmurs, rubs or gallops; Abdomen: soft, NT, no masses Skin: no rashes, ulcers noted  Vascular Exam Pulses: 3+ radial pulses bilaterally, palpable lower extremity pulses bilaterally, there is no abdominal pulsatile AAA palpated Carotid bruits: Carotid pulses to auscultation no bruits are heard Extremities without ischemic changes, no Gangrene , no cellulitis; no open wounds;  Musculoskeletal: no muscle wasting or atrophy   Neurologic: A&O X 3; Appropriate Affect ; SENSATION: normal;  MOTOR FUNCTION:  moving all extremities equally. Speech is fluent/normal  Non-Invasive Vascular Imaging AAA-measuring 2.4 AP by 2.42 transverse which is consistent with previous exam one year ago  ASSESSMENT/PLAN: Asymptomatic small AAA that is unchanged. The patient will followup in one year with repeat AAA duplex. The patient's questions were encouraged and answered, he is in agreement with this plan. The patient knows the signs and symptoms of rupture and knows to call 911 should this occur.  Lauree Chandler ANP   Clinic MD: Myra Gianotti

## 2012-03-16 NOTE — Addendum Note (Signed)
Addended by: Sharee Pimple on: 03/16/2012 10:23 AM   Modules accepted: Orders

## 2012-04-19 ENCOUNTER — Emergency Department (HOSPITAL_COMMUNITY)
Admission: EM | Admit: 2012-04-19 | Discharge: 2012-04-20 | Disposition: A | Discharge status: Home / Self Care | Payer: Medicare Other | Attending: Emergency Medicine | Admitting: Emergency Medicine

## 2012-04-19 ENCOUNTER — Encounter (HOSPITAL_COMMUNITY): Payer: Self-pay | Admitting: *Deleted

## 2012-04-19 DIAGNOSIS — Z860100 Personal history of colon polyps, unspecified: Secondary | ICD-10-CM | POA: Insufficient documentation

## 2012-04-19 DIAGNOSIS — Z951 Presence of aortocoronary bypass graft: Secondary | ICD-10-CM | POA: Insufficient documentation

## 2012-04-19 DIAGNOSIS — Z7982 Long term (current) use of aspirin: Secondary | ICD-10-CM | POA: Insufficient documentation

## 2012-04-19 DIAGNOSIS — I129 Hypertensive chronic kidney disease with stage 1 through stage 4 chronic kidney disease, or unspecified chronic kidney disease: Secondary | ICD-10-CM | POA: Insufficient documentation

## 2012-04-19 DIAGNOSIS — K5289 Other specified noninfective gastroenteritis and colitis: Secondary | ICD-10-CM | POA: Insufficient documentation

## 2012-04-19 DIAGNOSIS — Z86718 Personal history of other venous thrombosis and embolism: Secondary | ICD-10-CM | POA: Insufficient documentation

## 2012-04-19 DIAGNOSIS — E785 Hyperlipidemia, unspecified: Secondary | ICD-10-CM | POA: Insufficient documentation

## 2012-04-19 DIAGNOSIS — Z8719 Personal history of other diseases of the digestive system: Secondary | ICD-10-CM | POA: Insufficient documentation

## 2012-04-19 DIAGNOSIS — Z8739 Personal history of other diseases of the musculoskeletal system and connective tissue: Secondary | ICD-10-CM | POA: Insufficient documentation

## 2012-04-19 DIAGNOSIS — R112 Nausea with vomiting, unspecified: Secondary | ICD-10-CM | POA: Insufficient documentation

## 2012-04-19 DIAGNOSIS — Z87891 Personal history of nicotine dependence: Secondary | ICD-10-CM | POA: Insufficient documentation

## 2012-04-19 DIAGNOSIS — N189 Chronic kidney disease, unspecified: Secondary | ICD-10-CM | POA: Insufficient documentation

## 2012-04-19 DIAGNOSIS — Z7901 Long term (current) use of anticoagulants: Secondary | ICD-10-CM | POA: Insufficient documentation

## 2012-04-19 DIAGNOSIS — Z8679 Personal history of other diseases of the circulatory system: Secondary | ICD-10-CM | POA: Insufficient documentation

## 2012-04-19 DIAGNOSIS — Z862 Personal history of diseases of the blood and blood-forming organs and certain disorders involving the immune mechanism: Secondary | ICD-10-CM | POA: Insufficient documentation

## 2012-04-19 DIAGNOSIS — Z8711 Personal history of peptic ulcer disease: Secondary | ICD-10-CM | POA: Insufficient documentation

## 2012-04-19 DIAGNOSIS — R197 Diarrhea, unspecified: Secondary | ICD-10-CM | POA: Insufficient documentation

## 2012-04-19 DIAGNOSIS — K529 Noninfective gastroenteritis and colitis, unspecified: Secondary | ICD-10-CM

## 2012-04-19 DIAGNOSIS — Z79899 Other long term (current) drug therapy: Secondary | ICD-10-CM | POA: Insufficient documentation

## 2012-04-19 DIAGNOSIS — I2510 Atherosclerotic heart disease of native coronary artery without angina pectoris: Secondary | ICD-10-CM | POA: Insufficient documentation

## 2012-04-19 MED ORDER — ONDANSETRON HCL 4 MG/2ML IJ SOLN
4.0000 mg | Freq: Once | INTRAMUSCULAR | Status: AC
  Administered 2012-04-20: 4 mg via INTRAVENOUS
  Filled 2012-04-19: qty 2

## 2012-04-19 MED ORDER — LACTATED RINGERS IV BOLUS (SEPSIS)
1000.0000 mL | Freq: Once | INTRAVENOUS | Status: AC
  Administered 2012-04-20: 1000 mL via INTRAVENOUS

## 2012-04-19 MED ORDER — LOPERAMIDE HCL 2 MG PO CAPS
4.0000 mg | ORAL_CAPSULE | Freq: Once | ORAL | Status: AC
Start: 2012-04-20 — End: 2012-04-20
  Administered 2012-04-20: 4 mg via ORAL
  Filled 2012-04-19: qty 2

## 2012-04-19 NOTE — ED Provider Notes (Signed)
History     CSN: [id number]  Arrival date & time 04/19/12  2320   First MD Initiated Contact with Patient 04/19/12 2347      Chief Complaint  Patient presents with  . Nausea  . Emesis  . Diarrhea    (Consider location/radiation/quality/duration/timing/severity/associated sxs/prior treatment) HPIHomer L San is a 79 y.o. male with a history of AAA, CAD, atrial fibrillation, hypertension on chronic anticoagulation for his A. fib who presents with nausea vomiting and diarrhea. Patient also complains about bloating but has had no abdominal pain at all. Last night about 2 hours after eating had acute onset of vomiting. He had no pain during this episode. At the same time he had acute onset of diarrhea. Patient sat down on the toilet and proceeded to have a second episode of both diarrhea and vomiting. This occurred a couple more times and he decided to come to the hospital with his daughter was concerned about dehydration. Patient denies any chest pains, abdominal pains, back pain, shortness of breath, cough, fevers, chills. Patient says he was dizzy only once when he was actually vomiting but then it went away when he was done vomiting. Patient also does have a history of IBS. No numbness or tingling in his legs, no coldness, no weakness in the lower extremities and no flank pain. No urinary complaints. Stools have been watery, they're not bloody, they're not black.   Past Medical History  Diagnosis Date  . History of colon polyps   . Anemia   . Chronic kidney disease     History of Kidney Stones  . Ulcer     history of Peptic Ulcer Disease  . IBS (irritable bowel syndrome)   . Anxiety   . BPH (benign prostatic hypertrophy)   . CAD (coronary artery disease)   . Atrial fibrillation   . AAA (abdominal aortic aneurysm)   . Arthritis   . Chronic anticoagulation     On Coumadin therapy ( A Fib)  . Hypertension   . Hyperlipidemia   . History of DVT of lower extremity   . Cancer  Sept. 2013    Melanoma,Right  posterior shoulder    Past Surgical History  Procedure Date  . Appendectomy   . Cholecystectomy   . Tonsillectomy   . Rotator cuff repair     Right shoulder  . Coronary artery bypass graft   . Joint replacement     Left shoulder replacement  . Appendectomy   . Fracture surgery 2011    Left tibial ORIF by Dr. Lajoyce Corners  . Ankle fusion 2011    Left ankle    Family History  Problem Relation Age of Onset  . Cancer Mother     stomach cancer  . Heart disease Brother   . Heart disease Brother   . Heart disease Brother     History  Substance Use Topics  . Smoking status: Former Smoker    Quit date: 04/08/1996  . Smokeless tobacco: Not on file  . Alcohol Use: No     Comment: heavy alcohol use in the past      Review of Systems At least 10pt or greater review of systems completed and are negative except where specified in the HPI.  Allergies  Morphine and related and Codeine  Home Medications   Current Outpatient Rx  Name  Route  Sig  Dispense  Refill  . AMLODIPINE BESYLATE 5 MG PO TABS   Oral   Take 5 mg by mouth  daily.           . ASPIRIN EC 81 MG PO TBEC   Oral   Take 81 mg by mouth daily.           . ATORVASTATIN CALCIUM 40 MG PO TABS      daily.          Marland Kitchen CITALOPRAM HYDROBROMIDE 10 MG PO TABS      daily.          Marland Kitchen METOPROLOL SUCCINATE ER 25 MG PO TB24               . MAXZIDE-25 PO   Oral   Take 25 mg by mouth daily.           . WARFARIN SODIUM 5 MG PO TABS      as directed.          . WARFARIN SODIUM 7.5 MG PO TABS   Oral   Take 6.5 mg by mouth daily.            BP 121/61  Pulse 107  Temp 97.7 F (36.5 C) (Oral)  Resp 18  SpO2 96%  Physical Exam  Nursing notes reviewed.  Electronic medical record reviewed. VITAL SIGNS:   Filed Vitals:   04/19/12 2327 04/20/12 0129 04/20/12 0200 04/20/12 0230  BP: 121/61 118/63 118/63 111/71  Pulse: 107 86 90 93  Temp: 97.7 F (36.5 C) 98.5 F  (36.9 C)    TempSrc: Oral Oral    Resp: 18 20 17 16   SpO2: 96% 94% 97% 96%   CONSTITUTIONAL: Awake, oriented, appears non-toxic HENT: Atraumatic, normocephalic, oral mucosa pink and moist, airway patent. Nares patent without drainage. External ears normal. EYES: Conjunctiva clear, EOMI, PERRLA NECK: Trachea midline, non-tender, supple CARDIOVASCULAR: Normal heart rate, Normal rhythm, No murmurs, rubs, gallops PULMONARY/CHEST: Clear to auscultation, no rhonchi, wheezes, or rales. Symmetrical breath sounds. Non-tender. ABDOMINAL: Non-distended, soft, non-tender - no rebound or guarding.  BS increased. NEUROLOGIC: Non-focal, moving all four extremities, no gross sensory or motor deficits. EXTREMITIES: No clubbing, cyanosis, or edema SKIN: Warm, Dry, No erythema, No rash  ED Course  Procedures (including critical care time)  Date: 04/20/2012  Rate: 96  Rhythm: Atrial fibrillation  QRS Axis: normal  Intervals: normal  ST/T Wave abnormalities: normal  Conduction Disutrbances: none  Narrative Interpretation: unremarkable     Labs Reviewed  CBC WITH DIFFERENTIAL - Abnormal; Notable for the following:    WBC 16.4 (*)     RDW 15.6 (*)     Neutrophils Relative 86 (*)     Neutro Abs 14.1 (*)     Lymphocytes Relative 4 (*)     Monocytes Absolute 1.4 (*)     All other components within normal limits  COMPREHENSIVE METABOLIC PANEL - Abnormal; Notable for the following:    Sodium 134 (*)     Chloride 94 (*)     Glucose, Bld 155 (*)     GFR calc non Af Amer 47 (*)     GFR calc Af Amer 54 (*)     All other components within normal limits  LAB REPORT - SCANNED   No results found.   1. Nausea and vomiting   2. Diarrhea   3. Acute gastroenteritis       MDM  Kurtiss L Olesen is a 79 y.o. male a significant medical history who presents with nausea vomiting and diarrhea that began acutely this evening about 2 hours after eating. I do  not think he's got food poisoning-I. think  this is likely rather viral gastroenteritis that seems to be rampant in the community at this point. There is no pain associated with this gentlemans symptoms. He's had no chest pain, no shortness of breath, no fevers or chills. I do not think his nausea vomiting and diarrhea is anyway related to the patient's past medical history of coronary artery disease, AAA.  Patient is nontoxic, afebrile and appears well in general. Give the patient a little bit of fluid for some mild tachycardia-not sure if this is just atrial fibrillation, or as result of some mild volume depletion. We'll also check electrolytes.  Patient does have an elevated white count of 16.4 however this is likely secondary to his recurrent and frequent vomiting this evening. His electrolytes look fine maybe a little dehydration but he has received IV fluids. Patient is feeling fine and would like to go home. We'll send him home with Imodium as well as Zofran. I do not think he has an intra-abdominal emergency at this time I do not think that his symptoms are anyway related to his heart.  I explained the diagnosis and have given explicit precautions to return to the ER including  or any other new or worsening symptoms. The patient understands and accepts the medical plan as it's been dictated and I have answered their questions. Discharge instructions concerning home care and prescriptions have been given.  The patient is STABLE and is discharged to home in good condition.          Jones Skene, MD 04/20/12 0840

## 2012-04-19 NOTE — ED Notes (Signed)
Pt arrived from home with c/o bloating, nausea, vomiting, diarrhea, 2hrs after eating.

## 2012-04-20 LAB — CBC WITH DIFFERENTIAL/PLATELET
Basophils Absolute: 0 10*3/uL (ref 0.0–0.1)
Basophils Relative: 0 % (ref 0–1)
Lymphocytes Relative: 4 % — ABNORMAL LOW (ref 12–46)
Neutro Abs: 14.1 10*3/uL — ABNORMAL HIGH (ref 1.7–7.7)
Neutrophils Relative %: 86 % — ABNORMAL HIGH (ref 43–77)
Platelets: 190 10*3/uL (ref 150–400)
RDW: 15.6 % — ABNORMAL HIGH (ref 11.5–15.5)
WBC: 16.4 10*3/uL — ABNORMAL HIGH (ref 4.0–10.5)

## 2012-04-20 LAB — COMPREHENSIVE METABOLIC PANEL
ALT: 13 U/L (ref 0–53)
AST: 19 U/L (ref 0–37)
Albumin: 4 g/dL (ref 3.5–5.2)
CO2: 29 mEq/L (ref 19–32)
Calcium: 10.2 mg/dL (ref 8.4–10.5)
Chloride: 94 mEq/L — ABNORMAL LOW (ref 96–112)
GFR calc non Af Amer: 47 mL/min — ABNORMAL LOW (ref >90)
Sodium: 134 mEq/L — ABNORMAL LOW (ref 135–145)
Total Bilirubin: 0.6 mg/dL (ref 0.3–1.2)

## 2012-04-20 MED ORDER — ONDANSETRON HCL 8 MG PO TABS
4.0000 mg | ORAL_TABLET | Freq: Once | ORAL | Status: AC
  Administered 2012-04-20: 4 mg via ORAL
  Filled 2012-04-20: qty 1

## 2012-04-20 MED ORDER — ALUM & MAG HYDROXIDE-SIMETH 200-200-20 MG/5ML PO SUSP
15.0000 mL | Freq: Once | ORAL | Status: AC
  Administered 2012-04-20: 02:00:00 via ORAL
  Filled 2012-04-20: qty 30

## 2012-04-20 MED ORDER — LOPERAMIDE HCL 2 MG PO CAPS: 2.0000 mg | ORAL_CAPSULE | Freq: Four times a day (QID) | ORAL | Status: DC | PRN

## 2012-04-20 MED ORDER — LOPERAMIDE HCL 2 MG PO CAPS
2.0000 mg | ORAL_CAPSULE | Freq: Once | ORAL | Status: AC
Start: 2012-04-20 — End: 2012-04-20
  Administered 2012-04-20: 2 mg via ORAL
  Filled 2012-04-20: qty 1

## 2012-04-20 MED ORDER — ONDANSETRON 4 MG PO TBDP: 4.0000 mg | ORAL_TABLET | Freq: Three times a day (TID) | ORAL | Status: DC | PRN

## 2012-07-07 DIAGNOSIS — G459 Transient cerebral ischemic attack, unspecified: Secondary | ICD-10-CM

## 2012-07-07 HISTORY — DX: Transient cerebral ischemic attack, unspecified: G45.9

## 2012-07-30 ENCOUNTER — Observation Stay (HOSPITAL_COMMUNITY)
Admission: EM | Admit: 2012-07-30 | Discharge: 2012-07-31 | Disposition: A | Discharge status: Home / Self Care | Payer: Medicare Other | Attending: Internal Medicine | Admitting: Internal Medicine

## 2012-07-30 ENCOUNTER — Encounter (HOSPITAL_COMMUNITY): Payer: Self-pay | Admitting: Emergency Medicine

## 2012-07-30 ENCOUNTER — Emergency Department (HOSPITAL_COMMUNITY): Payer: Medicare Other

## 2012-07-30 ENCOUNTER — Observation Stay (HOSPITAL_COMMUNITY): Payer: Medicare Other

## 2012-07-30 DIAGNOSIS — Z7901 Long term (current) use of anticoagulants: Secondary | ICD-10-CM

## 2012-07-30 DIAGNOSIS — R29898 Other symptoms and signs involving the musculoskeletal system: Secondary | ICD-10-CM | POA: Insufficient documentation

## 2012-07-30 DIAGNOSIS — I10 Essential (primary) hypertension: Secondary | ICD-10-CM | POA: Diagnosis present

## 2012-07-30 DIAGNOSIS — R4701 Aphasia: Secondary | ICD-10-CM | POA: Insufficient documentation

## 2012-07-30 DIAGNOSIS — I4891 Unspecified atrial fibrillation: Secondary | ICD-10-CM | POA: Diagnosis present

## 2012-07-30 DIAGNOSIS — Z951 Presence of aortocoronary bypass graft: Secondary | ICD-10-CM | POA: Insufficient documentation

## 2012-07-30 DIAGNOSIS — I714 Abdominal aortic aneurysm, without rupture, unspecified: Secondary | ICD-10-CM | POA: Insufficient documentation

## 2012-07-30 DIAGNOSIS — E785 Hyperlipidemia, unspecified: Secondary | ICD-10-CM | POA: Diagnosis present

## 2012-07-30 DIAGNOSIS — I6509 Occlusion and stenosis of unspecified vertebral artery: Secondary | ICD-10-CM | POA: Insufficient documentation

## 2012-07-30 DIAGNOSIS — Z86010 Personal history of colonic polyps: Secondary | ICD-10-CM

## 2012-07-30 DIAGNOSIS — G459 Transient cerebral ischemic attack, unspecified: Secondary | ICD-10-CM | POA: Diagnosis present

## 2012-07-30 DIAGNOSIS — I2510 Atherosclerotic heart disease of native coronary artery without angina pectoris: Secondary | ICD-10-CM | POA: Diagnosis present

## 2012-07-30 DIAGNOSIS — I658 Occlusion and stenosis of other precerebral arteries: Principal | ICD-10-CM | POA: Insufficient documentation

## 2012-07-30 DIAGNOSIS — I6529 Occlusion and stenosis of unspecified carotid artery: Secondary | ICD-10-CM | POA: Insufficient documentation

## 2012-07-30 DIAGNOSIS — Z86718 Personal history of other venous thrombosis and embolism: Secondary | ICD-10-CM | POA: Insufficient documentation

## 2012-07-30 DIAGNOSIS — L259 Unspecified contact dermatitis, unspecified cause: Secondary | ICD-10-CM

## 2012-07-30 LAB — HEMOGLOBIN A1C
Hgb A1c MFr Bld: 5.4 % (ref <5.7)
Mean Plasma Glucose: 108 mg/dL (ref <117)

## 2012-07-30 LAB — CBC
MCH: 26.6 pg (ref 26.0–34.0)
Platelets: 167 10*3/uL (ref 150–400)
RBC: 4.67 MIL/uL (ref 4.22–5.81)
RDW: 15.8 % — ABNORMAL HIGH (ref 11.5–15.5)
WBC: 5.3 10*3/uL (ref 4.0–10.5)

## 2012-07-30 LAB — COMPREHENSIVE METABOLIC PANEL
ALT: 11 U/L (ref 0–53)
AST: 18 U/L (ref 0–37)
Alkaline Phosphatase: 68 U/L (ref 39–117)
Calcium: 10.4 mg/dL (ref 8.4–10.5)
GFR calc Af Amer: 65 mL/min — ABNORMAL LOW (ref >90)
Glucose, Bld: 99 mg/dL (ref 70–99)
Potassium: 3.4 mEq/L — ABNORMAL LOW (ref 3.5–5.1)
Sodium: 138 mEq/L (ref 135–145)
Total Protein: 7.1 g/dL (ref 6.0–8.3)

## 2012-07-30 LAB — URINALYSIS, ROUTINE W REFLEX MICROSCOPIC
Bilirubin Urine: NEGATIVE
Leukocytes, UA: NEGATIVE
Nitrite: NEGATIVE
Specific Gravity, Urine: 1.013 (ref 1.005–1.030)
Urobilinogen, UA: 0.2 mg/dL (ref 0.0–1.0)

## 2012-07-30 LAB — DIFFERENTIAL
Basophils Absolute: 0 10*3/uL (ref 0.0–0.1)
Eosinophils Absolute: 0.2 10*3/uL (ref 0.0–0.7)
Lymphocytes Relative: 17 % (ref 12–46)
Lymphs Abs: 0.9 10*3/uL (ref 0.7–4.0)
Neutrophils Relative %: 65 % (ref 43–77)

## 2012-07-30 LAB — PROTIME-INR
INR: 1.83 — ABNORMAL HIGH (ref 0.00–1.49)
Prothrombin Time: 20.5 seconds — ABNORMAL HIGH (ref 11.6–15.2)

## 2012-07-30 LAB — POCT I-STAT TROPONIN I

## 2012-07-30 MED ORDER — POTASSIUM CHLORIDE CRYS ER 20 MEQ PO TBCR
40.0000 meq | EXTENDED_RELEASE_TABLET | Freq: Once | ORAL | Status: AC
  Administered 2012-07-30: 40 meq via ORAL
  Filled 2012-07-30: qty 2

## 2012-07-30 MED ORDER — WARFARIN - PHARMACIST DOSING INPATIENT: Freq: Every day | Status: DC

## 2012-07-30 MED ORDER — MECLIZINE HCL 25 MG PO TABS
25.0000 mg | ORAL_TABLET | Freq: Every day | ORAL | Status: DC
  Administered 2012-07-31: 25 mg via ORAL
  Filled 2012-07-30: qty 1

## 2012-07-30 MED ORDER — ACETAMINOPHEN ER 650 MG PO TBCR
1300.0000 mg | EXTENDED_RELEASE_TABLET | Freq: Three times a day (TID) | ORAL | Status: DC | PRN
Start: 2012-07-30 — End: 2012-07-30

## 2012-07-30 MED ORDER — SODIUM CHLORIDE 0.9 % IV SOLN
Freq: Once | INTRAVENOUS | Status: AC
  Administered 2012-07-30: 12:00:00 via INTRAVENOUS

## 2012-07-30 MED ORDER — NITROGLYCERIN 0.4 MG SL SUBL: 0.4000 mg | SUBLINGUAL_TABLET | SUBLINGUAL | Status: DC | PRN

## 2012-07-30 MED ORDER — WARFARIN SODIUM 7.5 MG PO TABS
7.5000 mg | ORAL_TABLET | Freq: Once | ORAL | Status: AC
  Administered 2012-07-30: 7.5 mg via ORAL
  Filled 2012-07-30: qty 1

## 2012-07-30 MED ORDER — ATORVASTATIN CALCIUM 40 MG PO TABS
40.0000 mg | ORAL_TABLET | Freq: Every day | ORAL | Status: DC
  Administered 2012-07-30: 40 mg via ORAL
  Filled 2012-07-30 (×2): qty 1

## 2012-07-30 MED ORDER — ADULT MULTIVITAMIN W/MINERALS CH
1.0000 | ORAL_TABLET | Freq: Every day | ORAL | Status: DC
  Administered 2012-07-30: 1 via ORAL
  Filled 2012-07-30 (×2): qty 1

## 2012-07-30 MED ORDER — SODIUM CHLORIDE 0.9 % IV SOLN
INTRAVENOUS | Status: DC
  Administered 2012-07-30 – 2012-07-31 (×2): via INTRAVENOUS

## 2012-07-30 MED ORDER — ASPIRIN EC 81 MG PO TBEC: 81.0000 mg | DELAYED_RELEASE_TABLET | Freq: Every day | ORAL | Status: DC

## 2012-07-30 MED ORDER — LOPERAMIDE HCL 2 MG PO CAPS
2.0000 mg | ORAL_CAPSULE | Freq: Four times a day (QID) | ORAL | Status: DC | PRN
  Filled 2012-07-30: qty 1

## 2012-07-30 MED ORDER — CITALOPRAM HYDROBROMIDE 10 MG PO TABS
10.0000 mg | ORAL_TABLET | Freq: Every day | ORAL | Status: DC
  Administered 2012-07-31: 10 mg via ORAL
  Filled 2012-07-30: qty 1

## 2012-07-30 MED ORDER — ACETAMINOPHEN 325 MG PO TABS: 650.0000 mg | ORAL_TABLET | ORAL | Status: DC | PRN

## 2012-07-30 MED ORDER — PROMETHAZINE HCL 25 MG PO TABS: 25.0000 mg | ORAL_TABLET | Freq: Four times a day (QID) | ORAL | Status: DC | PRN

## 2012-07-30 MED ORDER — ASPIRIN 325 MG PO TABS
325.0000 mg | ORAL_TABLET | Freq: Every day | ORAL | Status: DC
  Administered 2012-07-31: 325 mg via ORAL
  Filled 2012-07-30: qty 1

## 2012-07-30 NOTE — ED Notes (Signed)
Pt ambulated to restroom. Report called to floor, Bethann Berkshire, RN. Nurse has no further questions upon report. Pt will be transported up when finished with restroom and MD finished at bedside. Belongings being sent with pt and family.

## 2012-07-30 NOTE — ED Notes (Signed)
Pt amb to bathroom for BM

## 2012-07-30 NOTE — Progress Notes (Signed)
  Echocardiogram 2D Echocardiogram has been performed.  Gabriel Wood FRANCES 07/30/2012, 4:55 PM

## 2012-07-30 NOTE — Consult Note (Signed)
ANTICOAGULATION CONSULT NOTE - Initial Consult  Pharmacy Consult for Coumadin Indication: Hx atrial fibrillation, Hx DVT, TIA  Allergies: Allergies  Allergen Reactions  . Morphine And Related Palpitations  . Codeine Nausea Only    Height/Weight: Height: 5\' 9"  (175.3 cm) Weight: 182 lb (82.555 kg) IBW/kg (Calculated) : 70.7 Dosing weight 82 kg  Vital Signs: BP 124/52  Pulse 65  Temp(Src) 98.1 F (36.7 C) (Oral)  Resp 18  Ht 5\' 9"  (1.753 m)  Wt 182 lb (82.555 kg)  BMI 26.86 kg/m2  SpO2 92%  Active Problems: Principal Problem:   TIA (transient ischemic attack) Active Problems:   Atrial fibrillation   CAD (coronary artery disease)   AAA (abdominal aortic aneurysm)   Chronic anticoagulation   Hypertension   Hyperlipidemia   Labs:  Recent Labs  07/30/12 0856  HGB 12.4*  HCT 37.8*  PLT 167  APTT 37  LABPROT 20.5*  INR 1.83*  CREATININE 1.15   Lab Results  Component Value Date   INR 1.83* 07/30/2012   Estimated Creatinine Clearance: 46.1 ml/min (by C-G formula based on Cr of 1.15).  Medical / Surgical History: Past Medical History  Diagnosis Date  . History of colon polyps   . Anemia   . Chronic kidney disease     History of Kidney Stones  . Ulcer     history of Peptic Ulcer Disease  . IBS (irritable bowel syndrome)   . Anxiety   . BPH (benign prostatic hypertrophy)   . CAD (coronary artery disease)   . Atrial fibrillation   . AAA (abdominal aortic aneurysm)   . Arthritis   . Chronic anticoagulation     On Coumadin therapy ( A Fib)  . Hypertension   . Hyperlipidemia   . History of DVT of lower extremity   . Cancer Sept. 2013    Melanoma,Right  posterior shoulder   Past Surgical History  Procedure Laterality Date  . Appendectomy    . Cholecystectomy    . Tonsillectomy    . Rotator cuff repair      Right shoulder  . Coronary artery bypass graft    . Joint replacement      Left shoulder replacement  . Appendectomy    . Fracture  surgery  2011    Left tibial ORIF by Dr. Lajoyce Corners  . Ankle fusion  2011    Left ankle    Medications:  Medication Sig  . acetaminophen (TYLENOL) 650 MG CR tablet Take 1,300 mg by mouth every 8 (eight) hours as needed for pain.  Marland Kitchen aspirin EC 81 MG tablet Take 81 mg by mouth daily.    Marland Kitchen atorvastatin (LIPITOR) 40 MG tablet Take 40 mg by mouth daily.   . citalopram (CELEXA) 10 MG tablet Take 10 mg by mouth daily.   Marland Kitchen loperamide (IMODIUM) 2 MG capsule Take 1 capsule (2 mg total) by mouth 4 (four) times daily as needed for diarrhea or loose stools.  . meclizine (ANTIVERT) 25 MG tablet Take 25 mg by mouth daily.  . metoprolol succinate (TOPROL-XL) 25 MG 24 hr tablet Take 25 mg by mouth daily.   . Multiple Vitamin (MULTIVITAMIN WITH MINERALS) TABS Take 1 tablet by mouth every evening.  . nitroGLYCERIN (NITROSTAT) 0.4 MG SL tablet Place 0.4 mg under the tongue every 5 (five) minutes as needed for chest pain.  . Triamterene-HCTZ (MAXZIDE-25 PO) Take 25 mg by mouth daily.    Marland Kitchen warfarin (COUMADIN) 6 MG tablet Take 6 mg by mouth  daily.   Scheduled:  . [COMPLETED] sodium chloride   Intravenous Once  . aspirin  325 mg Oral Daily  . atorvastatin  40 mg Oral q1800  . [START ON 07/31/2012] citalopram  10 mg Oral Daily  . [START ON 07/31/2012] meclizine  25 mg Oral Daily  . multivitamin with minerals  1 tablet Oral q1800  . potassium chloride  40 mEq Oral Once  . [DISCONTINUED] aspirin EC  81 mg Oral Daily    Assessment:  79 y.o.male with past medical history of HTN, HLD, CAD s/p CABG, atrial fibrillation [on chronic Coumadin PTA ], AAA, DVT, and right shoulder melanoma brought to the hospital due to acute onset aphasia and right arm weakness.  He is to continue Coumadin Pharmacy to manage.  Baseline INR SUBtherapeutic, 1.83.  Hgb 12.4, Plt 167.  No bleeding complications noted   Home dose of Coumadin 6 mg daily.  His last dose of Coumadin reported 4/23.  Goal of Therapy:   INR 2-3      Plan:    Coumadin 7.5 mg today. Daily INR's, CBC. , Monitor for bleeding complications  Coumadin discharge education started.   Reid Nawrot, Elisha Head[place],  Pharm.D.. 07/30/2012,  3:07 PM

## 2012-07-30 NOTE — Consult Note (Signed)
Referring Physician: ED    Chief Complaint: transient aphasia with right arm weakness.  HPI:                                                                                                                                         Gabriel Wood is an 79 y.o. male, right handed, with a past medical history significant for hypertension, hyperlipidemia, CAD s/p CABG, atrial fibrillation on coumadin, AAA, DVT, right shoulder melanoma, brought to the hospital due to acute onset aphasia and right arm weakness. He said that he never had similar symptoms before but this morning he was at his cardiologist office talking to the nurse when suddenly developed difficulty expressing himself. He said that he couldn't get words out properly but he was able to understand well and was not confused. The episode lasted for about 3-5 minutes and completely resolved. Then, minutes later when ambulance got to the scene and medics asked him to write, he has " very poor grip with my right hand" which also lasted only couple of minutes. Denies headache, vertigo, double vision, difficulty swallowing, unsteadiness, or vision impairment. He said tha he has been having problems with low BP. Takes coumadin consistently but INR today was sub therapeutic at 1.83 CT brain unremarkable. MRI-DWI showed no acute stroke but an  asymmetric FLAIR signal in posterior left MCA M2 branches. This appearance can be seen in the setting of abnormally slow flow. Presently, he offers no complains.   Date last known well: 07/30/12 Time last known well: uncertain. tPA Given: no, symptoms resolved by the time of arrival to the ED.  Past Medical History  Diagnosis Date  . History of colon polyps   . Anemia   . Chronic kidney disease     History of Kidney Stones  . Ulcer     history of Peptic Ulcer Disease  . IBS (irritable bowel syndrome)   . Anxiety   . BPH (benign prostatic hypertrophy)   . CAD (coronary artery disease)   . Atrial  fibrillation   . AAA (abdominal aortic aneurysm)   . Arthritis   . Chronic anticoagulation     On Coumadin therapy ( A Fib)  . Hypertension   . Hyperlipidemia   . History of DVT of lower extremity   . Cancer Sept. 2013    Melanoma,Right  posterior shoulder    Past Surgical History  Procedure Laterality Date  . Appendectomy    . Cholecystectomy    . Tonsillectomy    . Rotator cuff repair      Right shoulder  . Coronary artery bypass graft    . Joint replacement      Left shoulder replacement  . Appendectomy    . Fracture surgery  2011    Left tibial ORIF by Dr. Lajoyce Corners  . Ankle fusion  2011    Left ankle  Family History  Problem Relation Age of Onset  . Cancer Mother     stomach cancer  . Heart disease Brother   . Heart disease Brother   . Heart disease Brother    Social History:  reports that he quit smoking about 16 years ago. He does not have any smokeless tobacco history on file. He reports that he does not drink alcohol or use illicit drugs.  Allergies:  Allergies  Allergen Reactions  . Morphine And Related Palpitations  . Codeine Nausea Only    Medications:                                                                                                                           I have reviewed the patient's current medications.  ROS:                                                                                                                                       History obtained from the patient  General ROS: negative for - chills, fatigue, fever, night sweats, weight gain or weight loss Psychological ROS: negative for - behavioral disorder, hallucinations, memory difficulties, mood swings or suicidal ideation Ophthalmic ROS: negative for - blurry vision, double vision, eye pain or loss of vision ENT ROS: negative for - epistaxis, nasal discharge, oral lesions, sore throat, tinnitus or vertigo Allergy and Immunology ROS: negative for - hives or  itchy/watery eyes Hematological and Lymphatic ROS: negative for - bleeding problems, bruising or swollen lymph nodes Endocrine ROS: negative for - galactorrhea, hair pattern changes, polydipsia/polyuria or temperature intolerance Respiratory ROS: negative for - cough, hemoptysis, shortness of breath or wheezing Cardiovascular ROS: negative for - chest pain, dyspnea on exertion, edema or irregular heartbeat Gastrointestinal ROS: negative for - abdominal pain, diarrhea, hematemesis, nausea/vomiting or stool incontinence Genito-Urinary ROS: negative for - dysuria, hematuria, incontinence or urinary frequency/urgency Musculoskeletal ROS: negative for - joint swelling or muscular weakness Neurological ROS: as noted in HPI Dermatological ROS: negative for rash    Physical exam: pleasant male in no apparent distress. Blood pressure 127/69, pulse 62, temperature 97.9 F (36.6 C), temperature source Oral, resp. rate 16, height 5\' 9"  (1.753 m), weight 82.555 kg (182 lb), SpO2 100.00%. Head: normocephalic. Neck: supple, no bruits, no JVD. Cardiac: no murmurs. Lungs: clear. Abdomen: soft, no tender, no mass. Extremities: no edema.  Neurologic Examination:                                                                                                      Mental Status: Alert, awake, oriented x 4, thought content appropriate.  Speech fluent without evidence of aphasia.  Able to follow 3 step commands without difficulty. Cranial Nerves: II: Discs flat bilaterally; Visual fields grossly normal, pupils equal, round, reactive to light and accommodation III,IV, VI: ptosis not present, extra-ocular motions intact bilaterally V,VII: smile symmetric, facial light touch sensation normal bilaterally VIII: hearing normal bilaterally IX,X: gag reflex present XI: bilateral shoulder shrug XII: midline tongue extension Motor: Right : Upper extremity   5/5    Left:     Upper extremity   5/5  Lower  extremity   5/5     Lower extremity   5/5 Tone and bulk:normal tone throughout; no atrophy noted Sensory: Pinprick and light touch intact throughout, bilaterally Deep Tendon Reflexes: 2+ and symmetric throughout Plantars: Right: downgoing   Left: downgoing Cerebellar: normal finger-to-nose,  normal heel-to-shin test Gait: no ataxia. CV: pulses palpable throughout    Results for orders placed during the hospital encounter of 07/30/12 (from the past 48 hour(s))  PROTIME-INR     Status: Abnormal   Collection Time    07/30/12  8:56 AM      Result Value Range   Prothrombin Time 20.5 (*) 11.6 - 15.2 seconds   INR 1.83 (*) 0.00 - 1.49  APTT     Status: None   Collection Time    07/30/12  8:56 AM      Result Value Range   aPTT 37  24 - 37 seconds   Comment:            IF BASELINE aPTT IS ELEVATED,     SUGGEST PATIENT RISK ASSESSMENT     BE USED TO DETERMINE APPROPRIATE     ANTICOAGULANT THERAPY.  CBC     Status: Abnormal   Collection Time    07/30/12  8:56 AM      Result Value Range   WBC 5.3  4.0 - 10.5 K/uL   RBC 4.67  4.22 - 5.81 MIL/uL   Hemoglobin 12.4 (*) 13.0 - 17.0 g/dL   HCT 21.3 (*) 08.6 - 57.8 %   MCV 80.9  78.0 - 100.0 fL   MCH 26.6  26.0 - 34.0 pg   MCHC 32.8  30.0 - 36.0 g/dL   RDW 46.9 (*) 62.9 - 52.8 %   Platelets 167  150 - 400 K/uL  DIFFERENTIAL     Status: Abnormal   Collection Time    07/30/12  8:56 AM      Result Value Range   Neutrophils Relative 65  43 - 77 %   Neutro Abs 3.4  1.7 - 7.7 K/uL   Lymphocytes Relative 17  12 - 46 %   Lymphs Abs 0.9  0.7 - 4.0 K/uL   Monocytes Relative 15 (*) 3 - 12 %   Monocytes Absolute 0.8  0.1 - 1.0 K/uL  Eosinophils Relative 4  0 - 5 %   Eosinophils Absolute 0.2  0.0 - 0.7 K/uL   Basophils Relative 0  0 - 1 %   Basophils Absolute 0.0  0.0 - 0.1 K/uL  COMPREHENSIVE METABOLIC PANEL     Status: Abnormal   Collection Time    07/30/12  8:56 AM      Result Value Range   Sodium 138  135 - 145 mEq/L   Potassium 3.4  (*) 3.5 - 5.1 mEq/L   Chloride 96  96 - 112 mEq/L   CO2 32  19 - 32 mEq/L   Glucose, Bld 99  70 - 99 mg/dL   BUN 20  6 - 23 mg/dL   Creatinine, Ser 0.45  0.50 - 1.35 mg/dL   Calcium 40.9  8.4 - 81.1 mg/dL   Total Protein 7.1  6.0 - 8.3 g/dL   Albumin 4.0  3.5 - 5.2 g/dL   AST 18  0 - 37 U/L   ALT 11  0 - 53 U/L   Alkaline Phosphatase 68  39 - 117 U/L   Total Bilirubin 0.5  0.3 - 1.2 mg/dL   GFR calc non Af Amer 56 (*) >90 mL/min   GFR calc Af Amer 65 (*) >90 mL/min   Comment:            The eGFR has been calculated     using the CKD EPI equation.     This calculation has not been     validated in all clinical     situations.     eGFR's persistently     <90 mL/min signify     possible Chronic Kidney Disease.  TROPONIN I     Status: None   Collection Time    07/30/12  8:56 AM      Result Value Range   Troponin I <0.30  <0.30 ng/mL   Comment:            Due to the release kinetics of cTnI,     a negative result within the first hours     of the onset of symptoms does not rule out     myocardial infarction with certainty.     If myocardial infarction is still suspected,     repeat the test at appropriate intervals.  POCT I-STAT TROPONIN I     Status: None   Collection Time    07/30/12  9:15 AM      Result Value Range   Troponin i, poc 0.00  0.00 - 0.08 ng/mL   Comment 3            Comment: Due to the release kinetics of cTnI,     a negative result within the first hours     of the onset of symptoms does not rule out     myocardial infarction with certainty.     If myocardial infarction is still suspected,     repeat the test at appropriate intervals.   Mr Brain Wo Contrast  07/30/2012  *RADIOLOGY REPORT*  Clinical Data: 79 year old male with nausea, vomiting, dizziness, history of atrial fibrillation.  TIA.  MRI HEAD WITHOUT CONTRAST  Technique:  Multiplanar, multiecho pulse sequences of the brain and surrounding structures were obtained according to standard protocol  without intravenous contrast.  Comparison: None.  Findings: No restricted diffusion to suggest acute infarction.  No midline shift, mass effect, evidence of mass lesion, ventriculomegaly, extra-axial collection or acute intracranial hemorrhage.  Cervicomedullary junction and pituitary are within normal limits.  Major intracranial vascular flow voids are preserved.  However, there is asymmetric increased FLAIR signal associated with posterior left MCA M2 branches (series 6 image 14).  Scattered cerebral white matter T2 and FLAIR hyperintensity with an appearance suggestive of small chronic lacunar infarct (series 5 image 13 left periatrial white matter for example).  Small chronic lacunar infarct suspected in the medial right thalamus. Occasional chronic micro hemorrhages in the brain (left occipital lobe series 7 image 15).  Elsewhere gray and white matter signal within normal limits for age.  No cortical encephalomalacia. Negative visualized cervical spine.  Normal bone marrow signal.  Visualized orbit soft tissues are within normal limits.  Minor paranasal sinus mucosal thickening.  Mastoids are clear.  Grossly negative visible internal auditory structures.  Negative scalp soft tissues.  IMPRESSION: 1. No acute intracranial infarct. Mild for age chronic small vessel disease. 2.  Asymmetric FLAIR signal in posterior left MCA M2 branches. This appearance can be seen in the setting of abnormally slow flow. Intracranial MRA or CTA could evaluate further.   Original Report Authenticated By: Erskine Speed, M.D.      [facility] [phone]  07/30/2012, 2:27 PM   Assessment: 79 y.o. male with possible left anterior circulation TIA. Unimpressive neuro-exam. MRI-DWI showed no acute stroke but perhaps an area of slow flow posterior left MCA M2 branches. Sub therapeutic INR.  Admit to medicine for TIA work up.  Could benefit from new generation anticoagulant. Stroke team to resume care in the  morning.  Stroke Risk Factors - age, hypertension, hyperlipidemia, CAD s/p CABG, atrial fibrillation   Plan: 1. HgbA1c, fasting lipid panel 2. MRI, MRA  of the brain without contrast 3. PT consult, OT consult, Speech consult 4. Echocardiogram 5. Carotid dopplers 6. Prophylactic therapy-Anticoagulation: Coumadin 7. Risk factor modification 8. Telemetry monitoring 9. Frequent neuro checks   Wyatt Portela, MD [facility] [phone]  07/30/2012, 2:27 PM

## 2012-07-30 NOTE — ED Provider Notes (Signed)
I saw and evaluated the patient, reviewed the resident's note and I agree with the findings and plan.  On arrival to the emergency department the patient's aphasia had resolved and he was no longer having difficulty with his right arm.  This appears to be a TIA.  MRI scan of his brain demonstrates no acute infarct.  He does have evidence of a right MCA low flow flair signal.  This may be the cause of his symptoms as the patient reports that his blood pressures been in the low 100s over the past several weeks and that normally his blood pressure is higher than this.  I suspect that the patient may have some degree of orthostasis resulting in low flow state and ischemic symptoms.  Asymptomatic at this time.  Neuro consult.  Hospitalist to admit.  Mr Brain Wo Contrast  07/30/2012  *RADIOLOGY REPORT*  Clinical Data: 79 year old male with nausea, vomiting, dizziness, history of atrial fibrillation.  TIA.  MRI HEAD WITHOUT CONTRAST  Technique:  Multiplanar, multiecho pulse sequences of the brain and surrounding structures were obtained according to standard protocol without intravenous contrast.  Comparison: None.  Findings: No restricted diffusion to suggest acute infarction.  No midline shift, mass effect, evidence of mass lesion, ventriculomegaly, extra-axial collection or acute intracranial hemorrhage.  Cervicomedullary junction and pituitary are within normal limits.  Major intracranial vascular flow voids are preserved.  However, there is asymmetric increased FLAIR signal associated with posterior left MCA M2 branches (series 6 image 14).  Scattered cerebral white matter T2 and FLAIR hyperintensity with an appearance suggestive of small chronic lacunar infarct (series 5 image 13 left periatrial white matter for example).  Small chronic lacunar infarct suspected in the medial right thalamus. Occasional chronic micro hemorrhages in the brain (left occipital lobe series 7 image 15).  Elsewhere gray and white  matter signal within normal limits for age.  No cortical encephalomalacia. Negative visualized cervical spine.  Normal bone marrow signal.  Visualized orbit soft tissues are within normal limits.  Minor paranasal sinus mucosal thickening.  Mastoids are clear.  Grossly negative visible internal auditory structures.  Negative scalp soft tissues.  IMPRESSION: 1. No acute intracranial infarct. Mild for age chronic small vessel disease. 2.  Asymmetric FLAIR signal in posterior left MCA M2 branches. This appearance can be seen in the setting of abnormally slow flow. Intracranial MRA or CTA could evaluate further.   Original Report Authenticated By: Erskine Speed, M.D.     Lyanne Co, MD 07/30/12 1226

## 2012-07-30 NOTE — ED Provider Notes (Signed)
History     CSN: [id number]  Arrival date & time 07/30/12  4098   First MD Initiated Contact with Patient 07/30/12 [phone]      Chief Complaint  Patient presents with  . Transient Ischemic Attack    (Consider location/radiation/quality/duration/timing/severity/associated sxs/prior treatment) HPI  Patient presents from Cardiology office (Dr. Donnie Aho) where he had sudden onset difficulty speaking.  He says he could think the words in his head but could not make his mouth speak them.  He denies any facial droop or focal neuro deficits.  He says the symptoms resolved within a few minutes, but the office staff at Dr. York Spaniel office insisted he come to the ER.  He says he has not had chest pain, light-headedness, syncope, diaphoresis, nausea, abdominal pain, fevers or cough. He takes coumadin for A-fib and says his blood has been "too thin" the last few times.   Past Medical History  Diagnosis Date  . History of colon polyps   . Anemia   . Chronic kidney disease     History of Kidney Stones  . Ulcer     history of Peptic Ulcer Disease  . IBS (irritable bowel syndrome)   . Anxiety   . BPH (benign prostatic hypertrophy)   . CAD (coronary artery disease)   . Atrial fibrillation   . AAA (abdominal aortic aneurysm)   . Arthritis   . Chronic anticoagulation     On Coumadin therapy ( A Fib)  . Hypertension   . Hyperlipidemia   . History of DVT of lower extremity   . Cancer Sept. 2013    Melanoma,Right  posterior shoulder    Past Surgical History  Procedure Laterality Date  . Appendectomy    . Cholecystectomy    . Tonsillectomy    . Rotator cuff repair      Right shoulder  . Coronary artery bypass graft    . Joint replacement      Left shoulder replacement  . Appendectomy    . Fracture surgery  2011    Left tibial ORIF by Dr. Lajoyce Corners  . Ankle fusion  2011    Left ankle    Family History  Problem Relation Age of Onset  . Cancer Mother     stomach cancer  . Heart disease  Brother   . Heart disease Brother   . Heart disease Brother     History  Substance Use Topics  . Smoking status: Former Smoker    Quit date: 04/08/1996  . Smokeless tobacco: Not on file  . Alcohol Use: No     Comment: heavy alcohol use in the past     Review of Systems  Constitutional: Negative for fever.  HENT: Negative for neck pain.   Eyes: Negative for visual disturbance.  Respiratory: Negative for cough.   Cardiovascular: Negative for chest pain.  Gastrointestinal: Negative for abdominal pain.  Endocrine: Negative for polyuria.  Genitourinary: Negative for difficulty urinating.  Musculoskeletal: Negative for gait problem.  Skin: Negative for rash.  Neurological: Positive for speech difficulty. Negative for weakness.  Psychiatric/Behavioral: Negative for confusion.    Allergies  Morphine and related and Codeine  Home Medications   Current Outpatient Rx  Name  Route  Sig  Dispense  Refill  . aspirin EC 81 MG tablet   Oral   Take 81 mg by mouth daily.           Marland Kitchen atorvastatin (LIPITOR) 40 MG tablet      daily.          Marland Kitchen  citalopram (CELEXA) 10 MG tablet      daily.          Marland Kitchen loperamide (IMODIUM) 2 MG capsule   Oral   Take 1 capsule (2 mg total) by mouth 4 (four) times daily as needed for diarrhea or loose stools.   12 capsule   0   . metoprolol succinate (TOPROL-XL) 25 MG 24 hr tablet               . ondansetron (ZOFRAN ODT) 4 MG disintegrating tablet   Oral   Take 1 tablet (4 mg total) by mouth every 8 (eight) hours as needed for nausea.   10 tablet   0   . Triamterene-HCTZ (MAXZIDE-25 PO)   Oral   Take 25 mg by mouth daily.           Marland Kitchen warfarin (COUMADIN) 6 MG tablet   Oral   Take 6 mg by mouth daily.           BP 125/73  Pulse 82  Temp(Src) 97.4 F (36.3 C) (Oral)  Resp 19  SpO2 96%  Physical Exam  Vitals reviewed. Constitutional: He is oriented to person, place, and time. He appears well-developed and  well-nourished. No distress.  HENT:  Head: Normocephalic and atraumatic.  Mouth/Throat: Oropharynx is clear and moist.  Eyes: EOM are normal. Pupils are equal, round, and reactive to light.  Neck: Normal range of motion. No JVD present.  Cardiovascular: Normal rate and regular rhythm.   No murmur heard. Pulmonary/Chest: Effort normal and breath sounds normal.  Abdominal: Soft. Bowel sounds are normal. There is no tenderness.  Musculoskeletal: He exhibits no edema.  Neurological: He is alert and oriented to person, place, and time. He has normal strength. No cranial nerve deficit or sensory deficit.    ED Course  Procedures (including critical care time)  Labs Reviewed  PROTIME-INR - Abnormal; Notable for the following:    Prothrombin Time 20.5 (*)    INR 1.83 (*)    All other components within normal limits  CBC - Abnormal; Notable for the following:    Hemoglobin 12.4 (*)    HCT 37.8 (*)    RDW 15.8 (*)    All other components within normal limits  DIFFERENTIAL - Abnormal; Notable for the following:    Monocytes Relative 15 (*)    All other components within normal limits  COMPREHENSIVE METABOLIC PANEL - Abnormal; Notable for the following:    Potassium 3.4 (*)    GFR calc non Af Amer 56 (*)    GFR calc Af Amer 65 (*)    All other components within normal limits  APTT  TROPONIN I  POCT I-STAT TROPONIN I   Mr Brain Wo Contrast  07/30/2012  *RADIOLOGY REPORT*  Clinical Data: 79 year old male with nausea, vomiting, dizziness, history of atrial fibrillation.  TIA.  MRI HEAD WITHOUT CONTRAST  Technique:  Multiplanar, multiecho pulse sequences of the brain and surrounding structures were obtained according to standard protocol without intravenous contrast.  Comparison: None.  Findings: No restricted diffusion to suggest acute infarction.  No midline shift, mass effect, evidence of mass lesion, ventriculomegaly, extra-axial collection or acute intracranial hemorrhage.   Cervicomedullary junction and pituitary are within normal limits.  Major intracranial vascular flow voids are preserved.  However, there is asymmetric increased FLAIR signal associated with posterior left MCA M2 branches (series 6 image 14).  Scattered cerebral white matter T2 and FLAIR hyperintensity with an appearance suggestive of small chronic  lacunar infarct (series 5 image 13 left periatrial white matter for example).  Small chronic lacunar infarct suspected in the medial right thalamus. Occasional chronic micro hemorrhages in the brain (left occipital lobe series 7 image 15).  Elsewhere gray and white matter signal within normal limits for age.  No cortical encephalomalacia. Negative visualized cervical spine.  Normal bone marrow signal.  Visualized orbit soft tissues are within normal limits.  Minor paranasal sinus mucosal thickening.  Mastoids are clear.  Grossly negative visible internal auditory structures.  Negative scalp soft tissues.  IMPRESSION: 1. No acute intracranial infarct. Mild for age chronic small vessel disease. 2.  Asymmetric FLAIR signal in posterior left MCA M2 branches. This appearance can be seen in the setting of abnormally slow flow. Intracranial MRA or CTA could evaluate further.   Original Report Authenticated By: Erskine Speed, M.D.      Date: 07/30/2012  Rate: 89  Rhythm: atrial fibrillation  QRS Axis: normal  Intervals: normal  ST/T Wave abnormalities: nonspecific T wave changes  Conduction Disutrbances:none  Narrative Interpretation: Rate controlled A-Fib.   Old EKG Reviewed: unchanged    1. TIA (transient ischemic attack)       MDM  - Neurology consulted, will admit to medicine for TIA work-up        Ardyth Gal, MD 07/30/12 1214

## 2012-07-30 NOTE — Progress Notes (Signed)
Pt has two surgical spots related to Melanoma removal around the right upper shoulder. Had first surgery was on 05/22/12 at around right supraclavicular region, which he been doing wet to dry dressing twice a day. Changed the dressing twice today. The second surgery at right posterior shoulder was done 3 days ago (07/27/12), but patient is not sure what they did exactly (mentioned about took some biopsy). The site is sutured and healing. No complains of pain. Couldn't find much of these informations in the medical history list.  Fuller Canada, RN 07/30/12

## 2012-07-30 NOTE — H&P (Signed)
History and Physical       Hospital Admission Note Date: 07/30/2012  Patient name: Gabriel Wood Medical record number: [id number] Date of birth: [birth year] Age: 79 y.o. Gender: male PCP: Thora Lance, MD  Primary cardiologist: Dr. Donnie Aho  Chief Complaint:  Unable to speak for 5 minutes today with right hand clumsiness  HPI: Patient is a 79 year old male with history of hypertension, hyperlipidemia, coronary artery disease, atrial fibrillation on Coumadin presented with sudden onset of difficulty speaking today. Patient reported that he was at his cardiologist's office  and then around 8 AM, he could not speak at all. He stated that he could think of the words but they were not coming out of his mouth. He denied any focal weakness or facial drooping at that time. Patient stated that his symptoms resolved within 3-5 minutes. When he presented to the ER, also noticed her right hand clumsiness and difficulty signing his name however that improved in 5-10 seconds. He denied in any chest pain or shortness of breath or any palpitations. He has a history of atrial fibrillation and is on Coumadin, patient states that it has been difficult managing the Coumadin. He had some epistaxis over the weekend and he had his Coumadin on Monday, 3 days ago, INR today is subtherapeutic at 1.8. Patient will be admitted for TIA workup his symptoms have completely resolved. Patient was not considered a TPA candidate due to resolution of his symptoms and on warfarin.  Review of Systems:  Constitutional: Denies fever, chills, diaphoresis, poor appetite and fatigue.  HEENT: Denies photophobia, eye pain, redness, hearing loss, ear pain, congestion, sore throat, rhinorrhea, sneezing, mouth sores, trouble swallowing, neck pain, neck stiffness and tinnitus.   Respiratory: Denies SOB, DOE, cough, chest tightness,  and wheezing.   Cardiovascular: Denies chest pain,  palpitations and leg swelling.  Gastrointestinal: Denies nausea, vomiting, abdominal pain, diarrhea, constipation, blood in stool and abdominal distention.  Genitourinary: Denies dysuria, urgency, frequency, hematuria, flank pain and difficulty urinating.  Musculoskeletal: Denies myalgias, back pain, joint swelling, arthralgias and gait problem.  Skin: Denies pallor, rash and wound.  Neurological: Hematological: Denies adenopathy. Easy bruising, personal or family bleeding history  Psychiatric/Behavioral: Denies suicidal ideation, mood changes, confusion, nervousness, sleep disturbance and agitation  Past Medical History: Past Medical History  Diagnosis Date  . History of colon polyps   . Anemia   . Chronic kidney disease     History of Kidney Stones  . Ulcer     history of Peptic Ulcer Disease  . IBS (irritable bowel syndrome)   . Anxiety   . BPH (benign prostatic hypertrophy)   . CAD (coronary artery disease)   . Atrial fibrillation   . AAA (abdominal aortic aneurysm)   . Arthritis   . Chronic anticoagulation     On Coumadin therapy ( A Fib)  . Hypertension   . Hyperlipidemia   . History of DVT of lower extremity   . Cancer Sept. 2013    Melanoma,Right  posterior shoulder   Past Surgical History  Procedure Laterality Date  . Appendectomy    . Cholecystectomy    . Tonsillectomy    . Rotator cuff repair      Right shoulder  . Coronary artery bypass graft    . Joint replacement      Left shoulder replacement  . Appendectomy    . Fracture surgery  2011    Left tibial ORIF by Dr. Lajoyce Corners  . Ankle fusion  2011  Left ankle    Medications: Prior to Admission medications   Medication Sig Start Date End Date Taking? Authorizing Provider  acetaminophen (TYLENOL) 650 MG CR tablet Take 1,300 mg by mouth every 8 (eight) hours as needed for pain.   Yes Historical Provider, MD  aspirin EC 81 MG tablet Take 81 mg by mouth daily.     Yes Historical Provider, MD  atorvastatin  (LIPITOR) 40 MG tablet Take 40 mg by mouth daily.  02/12/11  Yes Historical Provider, MD  citalopram (CELEXA) 10 MG tablet Take 10 mg by mouth daily.  03/07/11  Yes Historical Provider, MD  loperamide (IMODIUM) 2 MG capsule Take 1 capsule (2 mg total) by mouth 4 (four) times daily as needed for diarrhea or loose stools. 04/20/12  Yes John-Adam Bonk, MD  meclizine (ANTIVERT) 25 MG tablet Take 25 mg by mouth daily.   Yes Historical Provider, MD  metoprolol succinate (TOPROL-XL) 25 MG 24 hr tablet Take 25 mg by mouth daily.  03/07/11  Yes Historical Provider, MD  Multiple Vitamin (MULTIVITAMIN WITH MINERALS) TABS Take 1 tablet by mouth every evening.   Yes Historical Provider, MD  nitroGLYCERIN (NITROSTAT) 0.4 MG SL tablet Place 0.4 mg under the tongue every 5 (five) minutes as needed for chest pain.   Yes Historical Provider, MD  Triamterene-HCTZ (MAXZIDE-25 PO) Take 25 mg by mouth daily.     Yes Historical Provider, MD  warfarin (COUMADIN) 6 MG tablet Take 6 mg by mouth daily.   Yes Historical Provider, MD  promethazine (PHENERGAN) 25 MG tablet Take 1 tablet (25 mg total) by mouth every 6 (six) hours as needed for nausea. 07/30/12   Ardyth Gal, MD    Allergies:   Allergies  Allergen Reactions  . Morphine And Related Palpitations  . Codeine Nausea Only    Social History:  reports that he quit smoking about 16 years ago. He does not have any smokeless tobacco history on file. He reports that he does not drink alcohol or use illicit drugs.  Family History: Family History  Problem Relation Age of Onset  . Cancer Mother     stomach cancer  . Heart disease Brother   . Heart disease Brother   . Heart disease Brother     Physical Exam: Blood pressure 127/69, pulse 62, temperature 97.9 F (36.6 C), temperature source Oral, resp. rate 16, height 5\' 9"  (1.753 m), weight 82.555 kg (182 lb), SpO2 100.00%. General: Alert, awake, oriented x3, in no acute distress. HEENT: normocephalic,  atraumatic, anicteric sclera, pink conjunctiva, pupils equal and reactive to light and accomodation, oropharynx clear Neck: supple, no masses or lymphadenopathy, no goiter, no bruits  Heart: Regular rate and rhythm, without murmurs, rubs or gallops. Lungs: Clear to auscultation bilaterally, no wheezing, rales or rhonchi. Abdomen: Soft, nontender, nondistended, positive bowel sounds, no masses. Extremities: No clubbing, cyanosis or edema with positive pedal pulses. Neuro: Grossly intact, no focal neurological deficits, strength 5/5 upper and lower extremities bilaterally Psych: alert and oriented x 3, normal mood and affect Skin: no rashes or lesions, warm and dry   LABS on Admission:  Basic Metabolic Panel:  Recent Labs Lab 07/30/12 0856  NA 138  K 3.4*  CL 96  CO2 32  GLUCOSE 99  BUN 20  CREATININE 1.15  CALCIUM 10.4   Liver Function Tests:  Recent Labs Lab 07/30/12 0856  AST 18  ALT 11  ALKPHOS 68  BILITOT 0.5  PROT 7.1  ALBUMIN 4.0   No results  found for this basename: LIPASE, AMYLASE,  in the last 168 hours No results found for this basename: AMMONIA,  in the last 168 hours CBC:  Recent Labs Lab 07/30/12 0856  WBC 5.3  NEUTROABS 3.4  HGB 12.4*  HCT 37.8*  MCV 80.9  PLT 167   Cardiac Enzymes:  Recent Labs Lab 07/30/12 0856  TROPONINI <0.30   BNP: No components found with this basename: POCBNP,  CBG: No results found for this basename: GLUCAP,  in the last 168 hours   Radiological Exams on Admission: Mr Brain Wo Contrast  07/30/2012  *RADIOLOGY REPORT*  Clinical Data: 79 year old male with nausea, vomiting, dizziness, history of atrial fibrillation.  TIA.  MRI HEAD WITHOUT CONTRAST  Technique:  Multiplanar, multiecho pulse sequences of the brain and surrounding structures were obtained according to standard protocol without intravenous contrast.  Comparison: None.  Findings: No restricted diffusion to suggest acute infarction.  No midline shift,  mass effect, evidence of mass lesion, ventriculomegaly, extra-axial collection or acute intracranial hemorrhage.  Cervicomedullary junction and pituitary are within normal limits.  Major intracranial vascular flow voids are preserved.  However, there is asymmetric increased FLAIR signal associated with posterior left MCA M2 branches (series 6 image 14).  Scattered cerebral white matter T2 and FLAIR hyperintensity with an appearance suggestive of small chronic lacunar infarct (series 5 image 13 left periatrial white matter for example).  Small chronic lacunar infarct suspected in the medial right thalamus. Occasional chronic micro hemorrhages in the brain (left occipital lobe series 7 image 15).  Elsewhere gray and white matter signal within normal limits for age.  No cortical encephalomalacia. Negative visualized cervical spine.  Normal bone marrow signal.  Visualized orbit soft tissues are within normal limits.  Minor paranasal sinus mucosal thickening.  Mastoids are clear.  Grossly negative visible internal auditory structures.  Negative scalp soft tissues.  IMPRESSION: 1. No acute intracranial infarct. Mild for age chronic small vessel disease. 2.  Asymmetric FLAIR signal in posterior left MCA M2 branches. This appearance can be seen in the setting of abnormally slow flow. Intracranial MRA or CTA could evaluate further.   Original Report Authenticated By: Erskine Speed, M.D.     Assessment/Plan Principal Problem:   TIA (transient ischemic attack) - Will admit patient for TIA workup, MRI done showed no acute intracranial infarct - Will obtain MRA, 2-D echo, carotid Dopplers for further workup - Neurology consulted, patient is on Coumadin for atrial fibrillation,INR subtherapeutic, he also is having difficult time with frequent INR monitoring and in the past has been supratherapeutic. With now TIA symptoms, he may be a good candidate for new oral anticoagulation agents, will discuss with neurology/ stroke  service.   Active Problems:   Atrial fibrillation - Currently in normal sinus rhythm,On Coumadin (see above)    CAD (coronary artery disease) - Currently asymptomatic no chest pain or shortness of breath. Troponin x1 negative, EKG showed sinus rhythm, no acute ST-T wave changes suggestive of ischemia    AAA (abdominal aortic aneurysm): 2.4 AP by 2.42 transverse  - Followed outpatient by vascular surgery, Dr. Myra Gianotti    Hypertension: patient reports that his BP has been running somewhat low lately - Currently stable    Hyperlipidemia - Obtain lipid panel, continue statins  DVT prophylaxis:  on Coumadin   CODE STATUS:  full code   Further plan will depend as patient's clinical course evolves and further radiologic and laboratory data become available.   Time Spent on Admission: One-hour  Claryssa Sandner M.D. [facility] 07/30/2012, 2:31 PM Pager: [phone]  If 7PM-7AM, please contact night-coverage [email or web address] Password [email or web address]

## 2012-07-30 NOTE — ED Notes (Signed)
Per [facility], pt was at MD office this morning and when nurse came out to get him he was starring off into space and had garbled speech. Pt is aware of symptoms when they are happening. Had similar episode during transition from stretcher to bed according to EMS. Pt dropped the stylet.

## 2012-07-30 NOTE — ED Notes (Signed)
Patient transported to MRI 

## 2012-07-31 DIAGNOSIS — G459 Transient cerebral ischemic attack, unspecified: Secondary | ICD-10-CM

## 2012-07-31 LAB — LIPID PANEL
Cholesterol: 104 mg/dL (ref 0–200)
HDL: 35 mg/dL — ABNORMAL LOW (ref >39)
Total CHOL/HDL Ratio: 3 RATIO
VLDL: 25 mg/dL (ref 0–40)

## 2012-07-31 LAB — PROTIME-INR: INR: 1.89 — ABNORMAL HIGH (ref 0.00–1.49)

## 2012-07-31 MED ORDER — DABIGATRAN ETEXILATE MESYLATE 150 MG PO CAPS
150.0000 mg | ORAL_CAPSULE | Freq: Two times a day (BID) | ORAL | Status: DC
  Filled 2012-07-31: qty 1

## 2012-07-31 MED ORDER — ENSURE COMPLETE PO LIQD: 237.0000 mL | Freq: Two times a day (BID) | ORAL | Status: DC

## 2012-07-31 MED ORDER — ENSURE COMPLETE PO LIQD
237.0000 mL | Freq: Two times a day (BID) | ORAL | Status: DC
  Administered 2012-07-31 (×2): 237 mL via ORAL

## 2012-07-31 MED ORDER — DABIGATRAN ETEXILATE MESYLATE 150 MG PO CAPS: 150.0000 mg | ORAL_CAPSULE | Freq: Two times a day (BID) | ORAL | Status: DC

## 2012-07-31 NOTE — Progress Notes (Addendum)
INITIAL NUTRITION ASSESSMENT  DOCUMENTATION CODES Per approved criteria  -Not Applicable   INTERVENTION: Ensure Complete po BID, each supplement provides 350 kcal and 13 grams of protein. Encouraged pt to consume adequate nutrition, discussed supplements before upcoming XRT.   NUTRITION DIAGNOSIS: Unintentional weight loss related to cancer surgery as evidenced by 6% weight loss x 4 months.   Goal: Pt to meet >/= 90% of their estimated nutrition needs.   Monitor:  PO intake, supplement acceptance, weight trend  Reason for Assessment: Positive Malnutrition Screening Tool  79 y.o. male  Admitting Dx: TIA (transient ischemic attack)  ASSESSMENT: Pt admitted for TIA, having workup. Pt with hx of melanoma with 2 removals, 2/14 and 4/21.  Pt reports his weight loss started due to his surgeries. Pt with 6% weight loss x 4 months which is not significant but does but pt at risk for malnutrition.  Per pt last surgery is very deep and after it heals plan is for XRT.   Height: Ht Readings from Last 1 Encounters:  07/30/12 5\' 9"  (1.753 m)    Weight: Wt Readings from Last 1 Encounters:  07/30/12 182 lb (82.555 kg)    Ideal Body Weight: 72.7 kg  % Ideal Body Weight: 113%  Wt Readings from Last 10 Encounters:  07/30/12 182 lb (82.555 kg)  03/16/12 194 lb 8 oz (88.225 kg)  03/11/11 196 lb (88.905 kg)  08/01/08 211 lb (95.709 kg)  01/20/08 211 lb 2.1 oz (95.769 kg)  12/15/07 213 lb (96.616 kg)    Usual Body Weight: 194 lb  % Usual Body Weight: 94%  BMI:  Body mass index is 26.86 kg/(m^2). Overweight  Estimated Nutritional Needs: Kcal: 1800-2000 Protein: 90-110 grams Fluid: > 1.8 L/day  Skin: incision   Diet Order: General Meal Completion: 100% this am   EDUCATION NEEDS: -No education needs identified at this time   Intake/Output Summary (Last 24 hours) at 07/31/12 0843 Last data filed at 07/31/12 0529  Gross per 24 hour  Intake      0 ml  Output    600 ml   Net   -600 ml    Last BM: PTA   Labs:   Recent Labs Lab 07/30/12 0856  NA 138  K 3.4*  CL 96  CO2 32  BUN 20  CREATININE 1.15  CALCIUM 10.4  GLUCOSE 99    CBG (last 3)  No results found for this basename: GLUCAP,  in the last 72 hours  Scheduled Meds: . aspirin  325 mg Oral Daily  . atorvastatin  40 mg Oral q1800  . citalopram  10 mg Oral Daily  . meclizine  25 mg Oral Daily  . multivitamin with minerals  1 tablet Oral q1800  . Warfarin - Pharmacist Dosing Inpatient   Does not apply q1800    Continuous Infusions: . sodium chloride 75 mL/hr at 07/31/12 0303    Past Medical History  Diagnosis Date  . History of colon polyps   . Anemia   . Chronic kidney disease     History of Kidney Stones  . Ulcer     history of Peptic Ulcer Disease  . IBS (irritable bowel syndrome)   . Anxiety   . BPH (benign prostatic hypertrophy)   . CAD (coronary artery disease)   . Atrial fibrillation   . AAA (abdominal aortic aneurysm)   . Arthritis   . Chronic anticoagulation     On Coumadin therapy ( A Fib)  . Hypertension   .  Hyperlipidemia   . History of DVT of lower extremity   . Cancer Sept. 2013    Melanoma,Right  posterior shoulder    Past Surgical History  Procedure Laterality Date  . Appendectomy    . Cholecystectomy    . Tonsillectomy    . Rotator cuff repair      Right shoulder  . Coronary artery bypass graft    . Joint replacement      Left shoulder replacement  . Appendectomy    . Fracture surgery  2011    Left tibial ORIF by Dr. Lajoyce Corners  . Ankle fusion  2011    Left ankle    Kendell Bane RD, LDN, CNSC [phone] Pager [phone] After Hours Pager

## 2012-07-31 NOTE — Progress Notes (Signed)
Stroke Team Progress Note  HISTORY Gabriel Wood is an 79 y.o. male, right handed, with a past medical history significant for hypertension, hyperlipidemia, CAD s/p CABG, atrial fibrillation on coumadin, AAA, DVT, right shoulder melanoma, brought to the hospital 07/30/2012 due to acute onset aphasia and right arm weakness.   He said that he never had similar symptoms before but this morning he was at his cardiologist office talking to the nurse when suddenly developed difficulty expressing himself. He said that he couldn't get words out properly but he was able to understand well and was not confused. The episode lasted for about 3-5 minutes and completely resolved. Then, minutes later when ambulance got to the scene and medics asked him to write, he has " very poor grip with my right hand" which also lasted only couple of minutes.  Denies headache, vertigo, double vision, difficulty swallowing, unsteadiness, or vision impairment.  He said tha he has been having problems with low BP. Takes coumadin consistently but INR today was sub therapeutic at 1.83   CT brain unremarkable. MRI-DWI showed no acute stroke but an asymmetric FLAIR signal in posterior left MCA M2 branches. This appearance can be seen in the setting of abnormally slow flow.   Patient was not a TPA candidate secondary to therapeutic INR, resolved symptoms and unclear time of onset. He was admitted for further evaluation and treatment.  SUBJECTIVE His daughter, son-in-law and his wife are at the bedside.  Overall he feels his condition is completely resolved.   OBJECTIVE Most recent Vital Signs: Filed Vitals:   07/30/12 2221 07/31/12 0029 07/31/12 0231 07/31/12 0527  BP: 113/62 117/59 106/59 121/72  Pulse: 72 80 74 74  Temp:  97.6 F (36.4 C) 97.9 F (36.6 C) 97.8 F (36.6 C)  TempSrc: Oral Oral Oral Oral  Resp: 18 20 20 20   Height:      Weight:      SpO2: 100% 96% 96% 97%   CBG (last 3)  No results found for this  basename: GLUCAP,  in the last 72 hours  IV Fluid Intake:   . sodium chloride 75 mL/hr at 07/31/12 0303    MEDICATIONS  . aspirin  325 mg Oral Daily  . atorvastatin  40 mg Oral q1800  . citalopram  10 mg Oral Daily  . feeding supplement  237 mL Oral BID BM  . meclizine  25 mg Oral Daily  . multivitamin with minerals  1 tablet Oral q1800  . Warfarin - Pharmacist Dosing Inpatient   Does not apply q1800   PRN:  acetaminophen, loperamide, nitroGLYCERIN  Diet:  General thin liquids Activity:   Bathroom privileges with assistance DVT Prophylaxis:  warfarin  CLINICALLY SIGNIFICANT STUDIES Basic Metabolic Panel:  Recent Labs Lab 07/30/12 0856  NA 138  K 3.4*  CL 96  CO2 32  GLUCOSE 99  BUN 20  CREATININE 1.15  CALCIUM 10.4   Liver Function Tests:  Recent Labs Lab 07/30/12 0856  AST 18  ALT 11  ALKPHOS 68  BILITOT 0.5  PROT 7.1  ALBUMIN 4.0   CBC:  Recent Labs Lab 07/30/12 0856  WBC 5.3  NEUTROABS 3.4  HGB 12.4*  HCT 37.8*  MCV 80.9  PLT 167   Coagulation:  Recent Labs Lab 07/30/12 0856  LABPROT 20.5*  INR 1.83*   Cardiac Enzymes:  Recent Labs Lab 07/30/12 0856  TROPONINI <0.30   Urinalysis:  Recent Labs Lab 07/30/12 2140  COLORURINE YELLOW  LABSPEC 1.013  PHURINE  7.5  GLUCOSEU NEGATIVE  HGBUR NEGATIVE  BILIRUBINUR NEGATIVE  KETONESUR NEGATIVE  PROTEINUR NEGATIVE  UROBILINOGEN 0.2  NITRITE NEGATIVE  LEUKOCYTESUR NEGATIVE   Lipid Panel    Component Value Date/Time   CHOL 104 07/31/2012 0620   TRIG 126 07/31/2012 0620   HDL 35* 07/31/2012 0620   CHOLHDL 3.0 07/31/2012 0620   VLDL 25 07/31/2012 0620   LDLCALC 44 07/31/2012 0620   HgbA1C  Lab Results  Component Value Date   HGBA1C 5.4 07/30/2012   Urine Drug Screen:   No results found for this basename: labopia, cocainscrnur, labbenz, amphetmu, thcu, labbarb    Alcohol Level: No results found for this basename: ETH,  in the last 168 hours  CT of the brain    MRI of the brain   07/30/2012  1. No acute intracranial infarct. Mild for age chronic small vessel disease. 2.  Asymmetric FLAIR signal in posterior left MCA M2 branches. This appearance can be seen in the setting of abnormally slow flow.   MRA of the brain  07/30/2012   1.  Suboptimal due to motion degradation despite repeated imaging attempts. 2.  No evidence of decreased left MCA flow as was suggested on the earlier MRI. 3.  However, there is evidence of a high-grade cavernous left ICA stenosis due to atherosclerosis. 4.  And there is also evidence of distal right vertebral artery occlusion.  This is likely chronic given the absence of right cerebellar changes on the earlier MRI.   2D Echocardiogram  EF 55-60% with no source of embolus.   Carotid Doppler    CXR    EKG  atrial fibrillation, rate 89.   Therapy Recommendations   Physical Exam   Pleasant elderly Caucasian lady currently not in distress.Awake alert. Afebrile. Head is nontraumatic. Neck is supple without bruit. Hearing is normal. Cardiac exam no murmur or gallop. Lungs are clear to auscultation. Distal pulses are well felt. Neurological Exam ;  Awake  Alert oriented x 3. Normal speech and language.eye movements full without nystagmus.fundi were not visualized. Vision acuity and fields appear normal. Hearing is normal. Palatal movements are normal. Face symmetric. Tongue midline. Normal strength, tone, reflexes and coordination. Normal sensation. Gait deferred. ASSESSMENT [name] is a 79 y.o. male presenting with acute onset aphasia and right arm weakness. Imaging confirms no acute infarct. Dx:  Left brain TIA. TIA felt to be  embolic secondary to known atrial fibrillation.  On warfarin prior to admission. INR on admission 1.8; difficulty regulating per patient. He wants to be off. Now on warfarin for secondary stroke prevention. Patient with no resultant neuro deficits. Work up completed; test results pending.  atrial fibrillation, on  chronic coumadin CAD - CABG AAA Hypertension Hyperlipidemia, LDL 44, on statin PTA, on statin now, goal LDL < 100 HgbA1c 5.4 Hx DVT LE Hx melanoma right posterior shoulder with removal 12/2011  Hospital day # 1  TREATMENT/PLAN  Change coumadin to Pradaxa 150 mg bid for secondary stroke prevention.  Ok for discharge from stroke standpoint  Recommend follow up with DR. Minami Arriaga in 2 months  Annie Main, MSN, RN, ANVP-BC, ANP-BC, [facility] Pager: [phone] 07/31/2012 11:17 AM  I have personally obtained a history, examined the patient, evaluated imaging results, and formulated the assessment and plan of care. I agree with the above.  Delia Heady, MD

## 2012-07-31 NOTE — Care Management Note (Signed)
    Page 1 of 1   07/31/2012     3:51:06 PM   CARE MANAGEMENT NOTE 07/31/2012  Patient:  Gabriel Wood, Gabriel Wood   Account Number:  [id number]  Date Initiated:  07/30/2012  Documentation initiated by:  [facility]  Subjective/Objective Assessment:   admitted for TIA workup     Action/Plan:   Pt/Ot evals- no follow up required, no equipment needs   Anticipated DC Date:  07/31/2012   Anticipated DC Plan:  HOME/SELF CARE      DC Planning Services  CM consult      Choice offered to / List presented to:             Status of service:  Completed, signed off Medicare Important Message given?   (If response is "NO", the following Medicare IM given date fields will be blank) Date Medicare IM given:   Date Additional Medicare IM given:    Discharge Disposition:  HOME/SELF CARE  Per UR Regulation:  Reviewed for med. necessity/level of care/duration of stay  If discussed at Long Length of Stay Meetings, dates discussed:    Comments:  07/31/12 Obtained prior auth for Pradaxa and gave patient fax of prior auth and cost information. Gave patient discount coupon for Pradaxa. Jacquelynn Cree RN, BSN, CCM

## 2012-07-31 NOTE — Discharge Summary (Signed)
Physician Discharge Summary  Patient ID: Gabriel Wood MRN: [id number] DOB/AGE: [birth year] 79 y.o.  Admit date: 07/30/2012 Discharge date: 07/31/2012  Primary Care Physician:  Thora Lance, MD  Discharge Diagnoses:    . TIA (transient ischemic attack) . Atrial fibrillation . CAD (coronary artery disease) . Hypertension . Hyperlipidemia . AAA (abdominal aortic aneurysm)  Consults: Neurology/stroke service   Recommendations for Outpatient Follow-up:  1) INR is 1.8 at discharge, okay to start Pradaxa tonight 2) Maxzide is on HOLD as patient complained that his blood pressure has been running low in last few days. He needs to followup with his PCP and cardiologist to readjust his BP medications.   Discharge Medications:   Medication List    STOP taking these medications       aspirin EC 81 MG tablet     MAXZIDE-25 PO     warfarin 6 MG tablet  Commonly known as:  COUMADIN      TAKE these medications       acetaminophen 650 MG CR tablet  Commonly known as:  TYLENOL  Take 1,300 mg by mouth every 8 (eight) hours as needed for pain.     atorvastatin 40 MG tablet  Commonly known as:  LIPITOR  Take 40 mg by mouth daily.     citalopram 10 MG tablet  Commonly known as:  CELEXA  Take 10 mg by mouth daily.     dabigatran 150 MG Caps  Commonly known as:  PRADAXA  Take 1 capsule (150 mg total) by mouth 2 (two) times daily.     feeding supplement Liqd  Take 237 mLs by mouth 2 (two) times daily between meals. Available over the counter     loperamide 2 MG capsule  Commonly known as:  IMODIUM  Take 1 capsule (2 mg total) by mouth 4 (four) times daily as needed for diarrhea or loose stools.     meclizine 25 MG tablet  Commonly known as:  ANTIVERT  Take 25 mg by mouth daily.     metoprolol succinate 25 MG 24 hr tablet  Commonly known as:  TOPROL-XL  Take 25 mg by mouth daily.     multivitamin with minerals Tabs  Take 1 tablet by mouth every evening.     nitroGLYCERIN 0.4 MG SL tablet  Commonly known as:  NITROSTAT  Place 0.4 mg under the tongue every 5 (five) minutes as needed for chest pain.     promethazine 25 MG tablet  Commonly known as:  PHENERGAN  Take 1 tablet (25 mg total) by mouth every 6 (six) hours as needed for nausea.         Brief H and P: For complete details please refer to admission H and P, but in brief Patient is a 79 year old male with history of hypertension, hyperlipidemia, coronary artery disease, atrial fibrillation on Coumadin presented with sudden onset of difficulty speaking.  Patient reported that he was at his cardiologist's office and then around 8 AM, he could not speak at all. He stated that he could think of the words but they were not coming out of his mouth. He denied any focal weakness or facial drooping at that time. Patient stated that his symptoms resolved within 3-5 minutes. When he presented to the ER, also noticed right hand clumsiness and difficulty signing his name however that improved in 5-10 seconds. He denied any chest pain or shortness of breath or any palpitations.  He has a history of atrial fibrillation and is  on Coumadin, patient states that it has been difficult managing the Coumadin. He had some epistaxis over the weekend and he held his Coumadin on Monday, 3 days ago, INR on admission is subtherapeutic at 1.8. Patient was admitted for TIA workup. Patient was not considered a TPA candidate due to resolution of his symptoms and on warfarin.   Hospital Course:  Patient is 79 year old male with history of hypertension, hyperlipidemia, coronary disease, atrial fibrillation on Coumadin presented with TIA symptoms that had resolved at the time of admission. TIA (transient ischemic attack) : Symptoms have completely resolved.  MRI done showed no acute intracranial infarct. MRA showed no evidence of decreased left MCA flow, there is evidence of high-grade cavernous left ICA stenosis due to  atherosclerosis.  2-D echo showed EF of 55-60%, mild aortic stenosis, severely dilated left atrium no cardiac source of embolism was identified Carotid Doppler showed no obvious evidence of hemodynamically significant internal carotid artery stenosis more than 40%. Neurology was consulted. Patient was noted to be on Coumadin for atrial fibrillation however INR was subtherapeutic and patient was having difficult time with frequent INR monitoring and in the past has been supratherapeutic. With now TIA symptoms, neurology/stroke service recommended starting patient on oral anticoagulation agents. INR at the time of discharge is 1.8, patient will be started on Pradaxa tonight.  - PT evaluation was done and recommended no PT followup, patient is at baseline  Atrial fibrillation  - Currently in normal sinus rhythm, started on pradaxa  CAD (coronary artery disease)  - Currently asymptomatic no chest pain or shortness of breath. Troponin x2 negative, EKG showed sinus rhythm, no acute ST-T wave changes suggestive of ischemia   AAA (abdominal aortic aneurysm): 2.4 AP by 2.42 transverse  - Followed outpatient by vascular surgery, Dr. Myra Gianotti   Hypertension: patient reports that his BP has been running somewhat low lately  - Patient was continued on beta blocker however Maxzide is on hold    Hyperlipidemia  - LDL 44, cholesterol 04 patient was continued on statins     Day of Discharge BP 112/55  Pulse 91  Temp(Src) 97.8 F (36.6 C) (Oral)  Resp 20  Ht 5\' 9"  (1.753 m)  Wt 82.555 kg (182 lb)  BMI 26.86 kg/m2  SpO2 100%  Physical Exam: General: Alert and awake oriented x3 not in any acute distress. HEENT: anicteric sclera, pupils reactive to light and accommodation CVS: S1-S2 clear no murmur rubs or gallops Chest: clear to auscultation bilaterally, no wheezing rales or rhonchi Abdomen: soft nontender, nondistended, normal bowel sounds Extremities: no cyanosis, clubbing or edema noted  bilaterally Neuro: Cranial nerves II-XII intact, no focal neurological deficits   The results of significant diagnostics from this hospitalization (including imaging, microbiology, ancillary and laboratory) are listed below for reference.    LAB RESULTS: Basic Metabolic Panel:  Recent Labs Lab 07/30/12 0856  NA 138  K 3.4*  CL 96  CO2 32  GLUCOSE 99  BUN 20  CREATININE 1.15  CALCIUM 10.4   Liver Function Tests:  Recent Labs Lab 07/30/12 0856  AST 18  ALT 11  ALKPHOS 68  BILITOT 0.5  PROT 7.1  ALBUMIN 4.0   No results found for this basename: LIPASE, AMYLASE,  in the last 168 hours No results found for this basename: AMMONIA,  in the last 168 hours CBC:  Recent Labs Lab 07/30/12 0856  WBC 5.3  NEUTROABS 3.4  HGB 12.4*  HCT 37.8*  MCV 80.9  PLT 167  Cardiac Enzymes:  Recent Labs Lab 07/30/12 0856  TROPONINI <0.30   BNP: No components found with this basename: POCBNP,  CBG:  Recent Labs Lab 07/31/12 1151  GLUCAP 106*    Significant Diagnostic Studies:  Mr Brain Wo Contrast  07/30/2012  *.  IMPRESSION: 1. No acute intracranial infarct. Mild for age chronic small vessel disease. 2.  Asymmetric FLAIR signal in posterior left MCA M2 branches. This appearance can be seen in the setting of abnormally slow flow. Intracranial MRA or CTA could evaluate further.   Original Report Authenticated By: Erskine Speed, M.D.    Mr Mra Head/brain Wo Cm  IMPRESSION: 1.  Suboptimal due to motion degradation despite repeated imaging attempts. 2.  No evidence of decreased left MCA flow as was suggested on the earlier MRI. 3.  However, there is evidence of a high-grade cavernous left ICA stenosis due to atherosclerosis. 4.  And there is also evidence of distal right vertebral artery occlusion.  This is likely chronic given the absence of right cerebellar changes on the earlier MRI.   Original Report Authenticated By: Erskine Speed, M.D.     2D ECHO: Study Conclusions  -  Left ventricle: The cavity size was normal. There was moderate concentric hypertrophy. Systolic function was normal. The estimated ejection fraction was in the range of 55% to 60%. Wall motion was normal; there were no regional wall motion abnormalities. - Aortic valve: There was mild stenosis. Valve area: 1.02cm^2(VTI). Valve area: 0.99cm^2 (Vmax). - Left atrium: The atrium was severely dilated. - Right atrium: The atrium was dilated. Impressions:  - No cardiac source of embolism was identified, but cannot be ruled out on the basis of this examination.    Disposition and Follow-up:     Discharge Orders   Future Appointments Provider Department Dept Phone   03/29/2013 9:00 AM [facility] [phone]   Eat a light meal the night before the exam but please avoid gaseous foods.   Nothing to eat or drink for at least 8 hours prior to the exam. No gum chewing or smoking the morning of the exam. Please take your morning medications with small sips of water, especially blood pressure medication. If you have several vascular lab exams and will see physician, please bring a snack with you.   03/29/2013 10:00 AM Evern Bio, NP [facility] [phone]   Future Orders Complete By Expires     Discharge instructions  As directed     Comments:      You can start Pradaxa tonight, take twice daily. Please stop aspirin and coumadin.    Increase activity slowly  As directed         DISPOSITION: Home DIET: Heart healthy diet ACTIVITY: As tolerated   DISCHARGE FOLLOW-UP Follow-up Information   Follow up with TILLEY JR,W SPENCER, MD. Schedule an appointment as soon as possible for a visit in 10 days. (for hospital follow-up)    Contact information:   [address] [place] [address] [phone]       Follow up with Thora Lance, MD. Schedule an appointment as soon as possible for a visit  in 2 weeks.   Contact information:   [address] [place] [address] [phone]       Time spent on Discharge:  Signed:   RAI,RIPUDEEP M.D. [facility] 07/31/2012, 1:41 PM Pager: [phone]

## 2012-07-31 NOTE — Progress Notes (Signed)
*  PRELIMINARY RESULTS* Vascular Ultrasound Carotid Duplex (Doppler) has been completed. There is no obvious evidence of hemodynamically significant internal carotid artery stenosis >40%. Right vertebral artery is patent with antegrade flow. Unable to visualize the left vertebral artery.  07/31/2012 12:34 PM Gertie Fey, RDMS, RDCS

## 2012-07-31 NOTE — Progress Notes (Signed)
Pt refused his bed alarm to be activated despite the benefits of the alarm being explained to him,saying he will walk out of the hospital if it was put on,pt becoming anxious and agitated,charge nurse notified who also came and explained the need for the alarm to pt,but he continued to refuse,risk for fall was also explained to pt,he further signed the fall contract sheet declining the use of the bed alarm,will however continue to monitor. Obasogie-Asidi, Jazmyne Beauchesne Efe

## 2012-07-31 NOTE — Evaluation (Signed)
Occupational Therapy Evaluation Patient Details Name: CLAUDIUS MICH MRN: [id number] DOB: 02-09-1924 Today's Date: 07/31/2012 Time: 4098-1191 OT Time Calculation (min): 16 min  OT Assessment / Plan / Recommendation Clinical Impression  Pt doing well and is independent with ADLs  and ADL mobility following episode of R UE weakness and difficulty with speech. All education completed and no further acute or follow up OT services needed at this time. OT will sign off    OT Assessment  Patient does not need any further OT services    Follow Up Recommendations  No OT follow up    Barriers to Discharge  None    Equipment Recommendations  None recommended by OT    Recommendations for Other Services  None  Frequency       Precautions / Restrictions Precautions Precautions: None Restrictions Weight Bearing Restrictions: No       ADL  Grooming: Performed;Wash/dry hands;Wash/dry face;Teeth care;Independent Upper Body Bathing: Independent;Simulated Lower Body Bathing: Simulated;Independent Upper Body Dressing: Performed;Independent Lower Body Dressing: Performed;Independent Toilet Transfer: Performed;Independent Toilet Transfer Method: Sit to Barista: Regular height toilet Toileting - Clothing Manipulation and Hygiene: Performed;Independent Where Assessed - Toileting Clothing Manipulation and Hygiene: Standing Tub/Shower Transfer: Performed;Independent Tub/Shower Transfer Method: Science writer: Walk in shower;Grab bars    OT Diagnosis:    OT Problem List:   OT Treatment Interventions:     OT Goals    Visit Information  Last OT Received On: 07/31/12 Assistance Needed: +1    Subjective Data  Subjective: " I feel like I'm back to normal " Patient Stated Goal: To return home   Prior Functioning     Home Living Lives With: Alone Type of Home: House Home Access: Stairs to enter [employer] of Steps:  1 Home Layout: One level Bathroom Shower/Tub: Tub/shower unit;Walk-in shower;Other (comment) Bathroom Toilet: Standard Home Adaptive Equipment: Straight cane;Built-in shower seat;Grab bars in shower Prior Function Level of Independence: Independent Able to Take Stairs?: Yes Driving: Yes Vocation: Retired Musician: No difficulties Dominant Hand: Right         Vision/Perception Vision - History Baseline Vision: Wears glasses only for reading Patient Visual Report: No change from baseline Perception Perception: Within Functional Limits   Cognition  Cognition Arousal/Alertness: Awake/alert Behavior During Therapy: WFL for tasks assessed/performed Overall Cognitive Status: Within Functional Limits for tasks assessed    Extremity/Trunk Assessment Right Upper Extremity Assessment RUE ROM/Strength/Tone: [facility] for tasks assessed;Deficits RUE ROM/Strength/Tone Deficits: AOM R shoulder impaired 75 - 80 degrees flexion RUE Sensation: WFL - Light Touch RUE Coordination: WFL - gross/fine motor Left Upper Extremity Assessment LUE ROM/Strength/Tone: WFL for tasks assessed LUE Sensation: WFL - Light Touch LUE Coordination: WFL - gross/fine motor    Mobility Bed Mobility Bed Mobility: Supine to Sit;Sitting - Scoot to Edge of Bed Supine to Sit: 7: Independent Sitting - Scoot to Edge of Bed: 7: Independent Transfers Sit to Stand: 6: Modified independent (Device/Increase time);With upper extremity assist;From bed;From toilet Stand to Sit: 6: Modified independent (Device/Increase time);With upper extremity assist;To bed;To toilet;To chair/3-in-1 Details for Transfer Assistance: safe mobility     Exercise     Balance Static Standing Balance Static Standing - Balance Support: No upper extremity supported Static Standing - Level of Assistance: 6: Modified independent (Device/Increase time) Dynamic Standing Balance Dynamic Standing - Balance Support: No upper  extremity supported;During functional activity Dynamic Standing - Level of Assistance: 6: Modified independent (Device/Increase time)   End of Session OT - End of  Session Activity Tolerance: Patient tolerated treatment well Patient left: Other (comment) (ambulating with PT)  GO Functional Limitation: Self care Self Care Current Status (Z6109): 0 percent impaired, limited or restricted Self Care Goal Status (U0454): 0 percent impaired, limited or restricted Self Care Discharge Status [phone]): 0 percent impaired, limited or restricted   Galen Manila 07/31/2012, 1:59 PM

## 2012-07-31 NOTE — Evaluation (Signed)
Physical Therapy Evaluation Patient Details Name: FARREN NELLES MRN: [id number] DOB: 04/23/1923 Today's Date: 07/31/2012 Time: 2130-8657 PT Time Calculation (min): 27 min  PT Assessment / Plan / Recommendation Clinical Impression  pt adm with TIA that resolved before fully admitted.  On eval, pt is at his baseline and pt/family feels he is safe to get back home.  No further PT needs.  No follow up.  Sign off from PT>    PT Assessment  Patent does not need any further PT services    Follow Up Recommendations  No PT follow up    Does the patient have the potential to tolerate intense rehabilitation      Barriers to Discharge        Equipment Recommendations  None recommended by PT    Recommendations for Other Services     Frequency      Precautions / Restrictions Restrictions Weight Bearing Restrictions: No   Pertinent Vitals/Pain       Mobility  Bed Mobility Bed Mobility: Supine to Sit;Sitting - Scoot to Edge of Bed Supine to Sit: 7: Independent Sitting - Scoot to Edge of Bed: 7: Independent Transfers Transfers: Sit to Stand;Stand to Sit Sit to Stand: 6: Modified independent (Device/Increase time);With upper extremity assist;From bed;From toilet Stand to Sit: 6: Modified independent (Device/Increase time);With upper extremity assist;To bed;To toilet;To chair/3-in-1 Details for Transfer Assistance: safe mobility Ambulation/Gait Ambulation/Gait Assistance: 6: Modified independent (Device/Increase time) Ambulation Distance (Feet): 400 Feet (with occaisional standing rests to catch his breath) Assistive device: None Ambulation/Gait Assistance Details: steady even if with limping gait due to leg length discrepancy Gait Pattern: Within Functional Limits Gait velocity: variable Stairs: Yes Stairs Assistance: 6: Modified independent (Device/Increase time) Stair Management Technique: One rail Right;Step to pattern;Forwards Number of Stairs: 4 Wheelchair  Mobility Wheelchair Mobility: No    Exercises     PT Diagnosis:    PT Problem List:   PT Treatment Interventions:     PT Goals    Visit Information  Last PT Received On: 07/31/12 Assistance Needed: +1    Subjective Data  Subjective: I feel pretty close to myself.Marland KitchenMarland KitchenI don't usually go without my shoes...because one leg is shorter. Patient Stated Goal: Home,  independent   Prior Functioning  Home Living Lives With: Alone Type of Home: House Home Access: Stairs to enter Entrance Stairs-Number of Steps: 1 Home Layout: One level Bathroom Shower/Tub: Tub/shower unit;Walk-in shower;Other (comment) Bathroom Toilet: Standard Home Adaptive Equipment: Straight cane;Built-in shower seat;Grab bars in shower Prior Function Level of Independence: Independent Able to Take Stairs?: Yes Driving: Yes Vocation: Retired Musician: No difficulties Dominant Hand: Right    Cognition  Cognition Arousal/Alertness: Awake/alert Behavior During Therapy: WFL for tasks assessed/performed Overall Cognitive Status: Within Functional Limits for tasks assessed    Extremity/Trunk Assessment Right Upper Extremity Assessment RUE ROM/Strength/Tone: [facility] for tasks assessed;Deficits RUE ROM/Strength/Tone Deficits: AOM R shoulder impaired 75 - 80 degrees flexion RUE Sensation: WFL - Light Touch RUE Coordination: WFL - gross/fine motor Left Upper Extremity Assessment LUE ROM/Strength/Tone: WFL for tasks assessed LUE Sensation: WFL - Light Touch LUE Coordination: WFL - gross/fine motor Right Lower Extremity Assessment RLE ROM/Strength/Tone: Within functional levels Left Lower Extremity Assessment LLE ROM/Strength/Tone: Within functional levels Trunk Assessment Trunk Assessment: Normal   Balance Standardized Balance Assessment Standardized Balance Assessment: Dynamic Gait Index Dynamic Gait Index Level Surface: Normal Change in Gait Speed: Mild Impairment Gait with Horizontal Head  Turns: Normal Gait with Vertical Head Turns: Normal Gait and Pivot Turn:  Normal Step Over Obstacle: Normal Step Around Obstacles: Normal Steps: Mild Impairment Total Score: 22  End of Session PT - End of Session Activity Tolerance: Patient tolerated treatment well Patient left: in chair;with family/visitor present;with call bell/phone within reach Nurse Communication: Mobility status  GP Functional Assessment Tool Used: Clinical Judgement Functional Limitation: Mobility: Walking and moving around Mobility: Walking and Moving Around Current Status (U9811): 0 percent impaired, limited or restricted Mobility: Walking and Moving Around Goal Status (B1478): 0 percent impaired, limited or restricted Mobility: Walking and Moving Around Discharge Status [phone]): 0 percent impaired, limited or restricted   Maxie Debose, Eliseo Gum 07/31/2012, 12:51 PM 07/31/2012  [place], PT [phone] (pager)

## 2012-07-31 NOTE — Progress Notes (Signed)
Pt discharge home with oxygen as pt on oxygen at home at home discharge instruction given condition stable . D/c home accompanied by daughter saNDRA Azzie Roup

## 2012-07-31 NOTE — Plan of Care (Signed)
Problem: Phase II Progression Outcomes Goal: Discharge plan established No recommended follow up OT services at this time

## 2012-08-05 ENCOUNTER — Observation Stay (HOSPITAL_COMMUNITY)
Admission: EM | Admit: 2012-08-05 | Discharge: 2012-08-07 | Disposition: A | Discharge status: Home / Self Care | Payer: Medicare Other | Attending: Cardiology | Admitting: Cardiology

## 2012-08-05 ENCOUNTER — Emergency Department (HOSPITAL_COMMUNITY): Payer: Medicare Other

## 2012-08-05 ENCOUNTER — Encounter (HOSPITAL_COMMUNITY): Payer: Self-pay | Admitting: Family Medicine

## 2012-08-05 DIAGNOSIS — I4891 Unspecified atrial fibrillation: Secondary | ICD-10-CM | POA: Insufficient documentation

## 2012-08-05 DIAGNOSIS — R0602 Shortness of breath: Secondary | ICD-10-CM | POA: Insufficient documentation

## 2012-08-05 DIAGNOSIS — C439 Malignant melanoma of skin, unspecified: Secondary | ICD-10-CM | POA: Insufficient documentation

## 2012-08-05 DIAGNOSIS — Z951 Presence of aortocoronary bypass graft: Secondary | ICD-10-CM | POA: Insufficient documentation

## 2012-08-05 DIAGNOSIS — Z7901 Long term (current) use of anticoagulants: Secondary | ICD-10-CM | POA: Insufficient documentation

## 2012-08-05 DIAGNOSIS — R079 Chest pain, unspecified: Secondary | ICD-10-CM

## 2012-08-05 DIAGNOSIS — I10 Essential (primary) hypertension: Secondary | ICD-10-CM | POA: Insufficient documentation

## 2012-08-05 DIAGNOSIS — R110 Nausea: Secondary | ICD-10-CM | POA: Insufficient documentation

## 2012-08-05 DIAGNOSIS — I2510 Atherosclerotic heart disease of native coronary artery without angina pectoris: Secondary | ICD-10-CM | POA: Diagnosis present

## 2012-08-05 DIAGNOSIS — E785 Hyperlipidemia, unspecified: Secondary | ICD-10-CM | POA: Insufficient documentation

## 2012-08-05 DIAGNOSIS — I200 Unstable angina: Principal | ICD-10-CM | POA: Diagnosis present

## 2012-08-05 HISTORY — DX: Angina pectoris, unspecified: I20.9

## 2012-08-05 HISTORY — DX: Transient cerebral ischemic attack, unspecified: G45.9

## 2012-08-05 LAB — COMPREHENSIVE METABOLIC PANEL
BUN: 16 mg/dL (ref 6–23)
CO2: 35 mEq/L — ABNORMAL HIGH (ref 19–32)
Calcium: 10 mg/dL (ref 8.4–10.5)
Chloride: 96 mEq/L (ref 96–112)
Creatinine, Ser: 1.32 mg/dL (ref 0.50–1.35)
GFR calc Af Amer: 55 mL/min — ABNORMAL LOW (ref >90)
GFR calc non Af Amer: 47 mL/min — ABNORMAL LOW (ref >90)
Glucose, Bld: 99 mg/dL (ref 70–99)
Total Bilirubin: 0.5 mg/dL (ref 0.3–1.2)

## 2012-08-05 LAB — CBC WITH DIFFERENTIAL/PLATELET
Basophils Absolute: 0 10*3/uL (ref 0.0–0.1)
Eosinophils Relative: 4 % (ref 0–5)
HCT: 36.2 % — ABNORMAL LOW (ref 39.0–52.0)
Hemoglobin: 11.9 g/dL — ABNORMAL LOW (ref 13.0–17.0)
Lymphocytes Relative: 13 % (ref 12–46)
Lymphs Abs: 1 10*3/uL (ref 0.7–4.0)
MCV: 82.3 fL (ref 78.0–100.0)
Monocytes Absolute: 1.1 10*3/uL — ABNORMAL HIGH (ref 0.1–1.0)
Monocytes Relative: 15 % — ABNORMAL HIGH (ref 3–12)
Neutro Abs: 4.9 10*3/uL (ref 1.7–7.7)
RDW: 16 % — ABNORMAL HIGH (ref 11.5–15.5)
WBC: 7.4 10*3/uL (ref 4.0–10.5)

## 2012-08-05 LAB — PROTIME-INR: INR: 1.34 (ref 0.00–1.49)

## 2012-08-05 LAB — TROPONIN I
Troponin I: <0.3 ng/mL (ref <0.30)
Troponin I: <0.3 ng/mL (ref <0.30)

## 2012-08-05 LAB — APTT: aPTT: 53 seconds — ABNORMAL HIGH (ref 24–37)

## 2012-08-05 LAB — POCT I-STAT TROPONIN I

## 2012-08-05 MED ORDER — ATORVASTATIN CALCIUM 40 MG PO TABS
40.0000 mg | ORAL_TABLET | Freq: Every day | ORAL | Status: DC
  Administered 2012-08-05 – 2012-08-07 (×3): 40 mg via ORAL
  Filled 2012-08-05 (×3): qty 1

## 2012-08-05 MED ORDER — METOPROLOL SUCCINATE ER 25 MG PO TB24
25.0000 mg | ORAL_TABLET | Freq: Every day | ORAL | Status: DC
  Filled 2012-08-05 (×2): qty 1

## 2012-08-05 MED ORDER — NITROGLYCERIN 0.4 MG SL SUBL: 0.4000 mg | SUBLINGUAL_TABLET | SUBLINGUAL | Status: DC | PRN

## 2012-08-05 MED ORDER — ASPIRIN EC 81 MG PO TBEC
81.0000 mg | DELAYED_RELEASE_TABLET | Freq: Every day | ORAL | Status: DC
  Administered 2012-08-06 – 2012-08-07 (×2): 81 mg via ORAL
  Filled 2012-08-05 (×2): qty 1

## 2012-08-05 MED ORDER — DABIGATRAN ETEXILATE MESYLATE 150 MG PO CAPS
150.0000 mg | ORAL_CAPSULE | Freq: Two times a day (BID) | ORAL | Status: DC
  Administered 2012-08-05 – 2012-08-07 (×4): 150 mg via ORAL
  Filled 2012-08-05 (×6): qty 1

## 2012-08-05 MED ORDER — CITALOPRAM HYDROBROMIDE 10 MG PO TABS
10.0000 mg | ORAL_TABLET | Freq: Every day | ORAL | Status: DC
  Administered 2012-08-06 – 2012-08-07 (×2): 10 mg via ORAL
  Filled 2012-08-05 (×3): qty 1

## 2012-08-05 MED ORDER — TIOTROPIUM BROMIDE MONOHYDRATE 18 MCG IN CAPS
18.0000 ug | ORAL_CAPSULE | Freq: Every day | RESPIRATORY_TRACT | Status: DC
  Administered 2012-08-05 – 2012-08-07 (×3): 18 ug via RESPIRATORY_TRACT
  Filled 2012-08-05: qty 5

## 2012-08-05 MED ORDER — ACETAMINOPHEN 325 MG PO TABS: 650.0000 mg | ORAL_TABLET | ORAL | Status: DC | PRN

## 2012-08-05 MED ORDER — ONDANSETRON HCL 4 MG/2ML IJ SOLN: 4.0000 mg | Freq: Four times a day (QID) | INTRAMUSCULAR | Status: DC | PRN

## 2012-08-05 MED ORDER — BUDESONIDE-FORMOTEROL FUMARATE 160-4.5 MCG/ACT IN AERO
2.0000 | INHALATION_SPRAY | Freq: Two times a day (BID) | RESPIRATORY_TRACT | Status: DC
  Administered 2012-08-05 – 2012-08-07 (×4): 2 via RESPIRATORY_TRACT
  Filled 2012-08-05: qty 6

## 2012-08-05 MED ORDER — DABIGATRAN ETEXILATE MESYLATE 150 MG PO CAPS: 150.0000 mg | ORAL_CAPSULE | Freq: Two times a day (BID) | ORAL | Status: DC

## 2012-08-05 MED ORDER — TRIAMTERENE-HCTZ 37.5-25 MG PO TABS
1.0000 | ORAL_TABLET | Freq: Every day | ORAL | Status: DC
  Administered 2012-08-06 – 2012-08-07 (×2): 1 via ORAL
  Filled 2012-08-05 (×3): qty 1

## 2012-08-05 NOTE — H&P (Signed)
History and Physical  Patient ID: Gabriel Wood MRN: [id number], SOB: 1923-06-19 79 y.o. Date of Encounter: 08/05/2012, 8:52 PM  Primary Physician: Thora Lance, MD Primary Cardiologist: Dr. Donnie Aho  Chief Complaint: chest pain  HPI: 79 y.o. male w/ PMHx significant for CAD s/p CABG (anatomy unknown), HTN, hyperlipidemia, melanoma who presented to [facility] on 08/05/2012 with complaints of chest pain x 1.  He was recently in the hospital with a TIA and discharged a week ago. During that hospitalization, his coumadin was switched to pradaxa and his aspirin was discontinued at discharged. His hctz/triamaterene was also discontinued but was subsequently restarted post discharge due to mild LE edema.  He returns to the ED today by ambulance after experiencing chest pain at home. After lunch, while working on bills at home, he had onset of chest pain that was left sided but he could isolate it to a single point. However, the pain did not improve and actually migrated to the center of his chest. He denies palpitations, pre-syncope, diaphoresis or radiation to arms or neck. Does endorse onset of shortness of breath with chest pain. After taking 2 nitro, he called 911 due to persistence of the pain. Pain improved significantly by the time paramedics arrived and resolved by the time he was seen in the ER.  Another recent event was resection of 2 lesions on his neck/back. First in February which was found to be melanoma and it sounds like a rather extensive resection with lymph node removal. More recently, another lesion removed, details unknown. Radiation is planned once surgical wound improves.  Currently chest pain free. No complaints. Prior to today, no recent nitro use. Lives alone, performs ADLs.   EKG revealed NSR with no acute ST changes. CXR showed R LL collapse which pt reports is old. Labs are significant for negative troponin, mild anemia.  Recent echo 4/24: nl EF, mild  stenosis per summary but valve area calculated at 1.02 cm2?, peak gradient of only 18 mmhg, severe dil LAA  Past Medical History  Diagnosis Date  . History of colon polyps   . Anemia   . Chronic kidney disease     History of Kidney Stones  . Ulcer     history of Peptic Ulcer Disease  . IBS (irritable bowel syndrome)   . Anxiety   . BPH (benign prostatic hypertrophy)   . CAD (coronary artery disease)   . Atrial fibrillation   . AAA (abdominal aortic aneurysm)   . Arthritis   . Chronic anticoagulation     On Coumadin therapy ( A Fib)  . Hypertension   . Hyperlipidemia   . History of DVT of lower extremity   . Cancer Sept. 2013    Melanoma,Right  posterior shoulder     Surgical History:  Past Surgical History  Procedure Laterality Date  . Appendectomy    . Cholecystectomy    . Tonsillectomy    . Rotator cuff repair      Right shoulder  . Coronary artery bypass graft    . Joint replacement      Left shoulder replacement  . Appendectomy    . Fracture surgery  2011    Left tibial ORIF by Dr. Lajoyce Corners  . Ankle fusion  2011    Left ankle     Home Meds: Prior to Admission medications   Medication Sig Start Date End Date Taking? Authorizing Provider  acetaminophen (TYLENOL) 650 MG CR tablet Take 1,300 mg by mouth every 8 (eight)  hours as needed for pain.   Yes Historical Provider, MD  atorvastatin (LIPITOR) 40 MG tablet Take 40 mg by mouth daily.  02/12/11  Yes Historical Provider, MD  citalopram (CELEXA) 10 MG tablet Take 10 mg by mouth daily.  03/07/11  Yes Historical Provider, MD  dabigatran (PRADAXA) 150 MG CAPS Take 1 capsule (150 mg total) by mouth 2 (two) times daily. 07/31/12  Yes Ripudeep Jenna Luo, MD  feeding supplement (ENSURE COMPLETE) LIQD Take 237 mLs by mouth 2 (two) times daily between meals. Available over the counter 07/31/12  Yes Ripudeep Jenna Luo, MD  meclizine (ANTIVERT) 25 MG tablet Take 25 mg by mouth daily.   Yes Historical Provider, MD  metoprolol succinate  (TOPROL-XL) 25 MG 24 hr tablet Take 25 mg by mouth daily.  03/07/11  Yes Historical Provider, MD  Multiple Vitamin (MULTIVITAMIN WITH MINERALS) TABS Take 1 tablet by mouth every evening.   Yes Historical Provider, MD  nitroGLYCERIN (NITROSTAT) 0.4 MG SL tablet Place 0.4 mg under the tongue every 5 (five) minutes as needed for chest pain.   Yes Historical Provider, MD  triamterene-hydrochlorothiazide (MAXZIDE-25) 37.5-25 MG per tablet Take 1 tablet by mouth daily.   Yes Historical Provider, MD  budesonide-formoterol (SYMBICORT) 160-4.5 MCG/ACT inhaler Inhale 2 puffs into the lungs 2 (two) times daily.    Historical Provider, MD  promethazine (PHENERGAN) 25 MG tablet Take 1 tablet (25 mg total) by mouth every 6 (six) hours as needed for nausea. 07/30/12   Ardyth Gal, MD  tiotropium (SPIRIVA) 18 MCG inhalation capsule Place 18 mcg into inhaler and inhale daily.    Historical Provider, MD    Allergies:  Allergies  Allergen Reactions  . Morphine And Related Palpitations  . Codeine Nausea Only    History   Social History  . Marital Status: Widowed    Spouse Name: N/A    Number of Children: N/A  . Years of Education: N/A   Occupational History  . Not on file.   Social History Main Topics  . Smoking status: Former Smoker    Quit date: 04/08/1996  . Smokeless tobacco: Not on file  . Alcohol Use: No     Comment: heavy alcohol use in the past  . Drug Use: No  . Sexually Active:    Other Topics Concern  . Not on file   Social History Narrative  . No narrative on file     Family History  Problem Relation Age of Onset  . Cancer Mother     stomach cancer  . Heart disease Brother   . Heart disease Brother   . Heart disease Brother     Review of Systems: General: negative for chills, fever, night sweats. Has lost weight over the past year. Cardiovascular: see HPI. Dermatological: surgical wounds as described in HPI Respiratory: negative for cough or wheezing Urologic:  negative for hematuria Abdominal: negative for nausea, vomiting, diarrhea, bright red blood per rectum, melena, or hematemesis Neurologic: negative for visual changes, syncope, or dizziness. TIA symptoms resolved. All other systems reviewed and are otherwise negative except as noted above.  Labs:   Lab Results  Component Value Date   WBC 7.4 08/05/2012   HGB 11.9* 08/05/2012   HCT 36.2* 08/05/2012   MCV 82.3 08/05/2012   PLT 207 08/05/2012    Recent Labs Lab 08/05/12 1513  NA 136  K 4.0  CL 96  CO2 35*  BUN 16  CREATININE 1.32  CALCIUM 10.0  PROT 6.4  BILITOT 0.5  ALKPHOS 68  ALT 9  AST 15  GLUCOSE 99    Recent Labs  08/05/12 1623  TROPONINI <0.30   Lab Results  Component Value Date   CHOL 104 07/31/2012   HDL 35* 07/31/2012   LDLCALC 44 07/31/2012   TRIG 126 07/31/2012   No results found for this basename: DDIMER    Radiology/Studies:  Dg Chest 2 View  08/05/2012  *RADIOLOGY REPORT*  Clinical Data: Left chest pain shortness of breath  CHEST - 2 VIEW  Comparison: CT scan from 03/11/2011 and chest x-ray from 02/05/2010  Findings: Marked asymmetric elevation of the right hemidiaphragm is new in the interval.  There is a right base atelectasis or infiltrate.  No evidence for focal consolidation in the left lung. No overt pulmonary edema. The cardiopericardial silhouette is enlarged.  The patient is status post CABG bones are diffusely demineralized.  IMPRESSION: Asymmetric elevation of the right hemidiaphragm with right base collapse / consolidation.  Follow-up recommended to ensure clearing of the medial right base.   Original Report Authenticated By: Kennith Center, M.D.      EKG: afib with controlled rate, prominent U wave, early R wave transition, unchanged from prior  Physical Exam: Blood pressure 110/70, pulse 72, temperature 97.7 F (36.5 C), temperature source Oral, resp. rate 14, height 5\' 9"  (1.753 m), weight 82.283 kg (181 lb 6.4 oz), SpO2 97.00%. General: Well  developed, well nourished, in no acute distress. Head: Normocephalic, atraumatic, sclera non-icteric, nares are without discharge Neck: Supple. Negative for carotid bruits. JVD not elevated. Lungs: absent sounds in R LL, otherwise, clear Heart: irreg, 2/6 SEM at RUSB Abdomen: Soft, non-tender, non-distended with normoactive bowel sounds. No rebound/guarding. No obvious abdominal masses. Msk:  Strength and tone appear normal for age. Extremities: trace LE edema bilaterally. No clubbing or cyanosis. Distal pedal pulses are 2+ and equal bilaterally. Neuro: Alert and oriented X 3. Moves all extremities spontaneously. Psych:  Responds to questions appropriately with a normal affect.   Problem List 1. Chest pain consistent with unstable angina, one episode 2. CAD s/p CABG 3. AAA, 2.4 cm x 2.4 cm 2013 u/s 4. HTN 5. Afib, on anticoagulation, 6. Recent TIA 7. Melanoma, invasive  ASSESSMENT AND PLAN:  79 y.o. male w/ PMHx significant for CAD s/p CABG (anatomy unknown), HTN, hyperlipidemia, melanoma who presented to [facility] on 08/05/2012 with complaints of chest pain x 1.   Presentation with mostly typical symptoms that is concerning for angina. Relieved with nitro. Encouragingly, no further episodes, negative EKG and negative initial biomarkers.  Admit to observation, rule out with telemetry, serial enzymes and telemetry. Continue beta blocker, statin, and double product control with anti-hypertensives. Restart aspirin, continue pradaxa. Possible precipitant for his angina was stopping his aspirin a week ago.  Decision for non-invasive risk stratification tomorrow, NPO just in case. Medical management is reasonable alternative if serial markers are negative and can ambulate without symptoms. Recent echo with normal EF. On note, he reports having a stress test in the last 2 years but I can;t find this in the system.  Continue BB and pradaxa for afib. Rate controlled.  Recent TIA, no  recurrent symptoms. Continue pradaxa, statin.  Prophylaxis: NPO p midnight for possible testing On pradaxa.  Code discussion: Pt clearly states that he does not want heroic measures if a cardio-respiratory emergency occurs during hospitalization. DNR.   Signed, Adolm Joseph, Troy Sine MD 08/05/2012, 8:52 PM

## 2012-08-05 NOTE — ED Notes (Signed)
Per EMS, pt started having left sided chest pain that radiates into epigastric area and is sharp. sts some nausea. Denies SOB and diaphoresis. 12 lead unremarkable. 2 nitro at home with relief. ASA PTA. 18 RFA. BP 130/74. afib on the monitor.

## 2012-08-05 NOTE — ED Provider Notes (Signed)
History     CSN: [id number]  Arrival date & time 08/05/12  1411   First MD Initiated Contact with Patient 08/05/12 1419      Chief Complaint  Patient presents with  . Chest Pain    (Consider location/radiation/quality/duration/timing/severity/associated sxs/prior treatment) Patient is a 79 y.o. male presenting with chest pain. The history is provided by the patient.  Chest Pain Pain location:  L chest Pain quality: aching and burning   Radiates to: started in the left chest and radiated to the sternum. Pain radiates to the back: no   Pain severity:  Moderate Onset quality:  Gradual Duration:  45 minutes Timing:  Constant Progression:  Resolved Chronicity:  New Context comment:  States he was siting at the table looking through mail when the pain started Relieved by:  Nitroglycerin Worsened by:  Nothing tried Ineffective treatments:  None tried Associated symptoms: nausea   Associated symptoms: no abdominal pain, no cough, no diaphoresis, no headache, no shortness of breath, not vomiting and no weakness   Risk factors: coronary artery disease, high cholesterol, hypertension, male sex and surgery   Risk factors: no immobilization, no prior DVT/PE and no smoking   Risk factors comment:  Recent stroke.  had surgery about 3 weeks ago on his neck   Past Medical History  Diagnosis Date  . History of colon polyps   . Anemia   . Chronic kidney disease     History of Kidney Stones  . Ulcer     history of Peptic Ulcer Disease  . IBS (irritable bowel syndrome)   . Anxiety   . BPH (benign prostatic hypertrophy)   . CAD (coronary artery disease)   . Atrial fibrillation   . AAA (abdominal aortic aneurysm)   . Arthritis   . Chronic anticoagulation     On Coumadin therapy ( A Fib)  . Hypertension   . Hyperlipidemia   . History of DVT of lower extremity   . Cancer Sept. 2013    Melanoma,Right  posterior shoulder    Past Surgical History  Procedure Laterality Date  .  Appendectomy    . Cholecystectomy    . Tonsillectomy    . Rotator cuff repair      Right shoulder  . Coronary artery bypass graft    . Joint replacement      Left shoulder replacement  . Appendectomy    . Fracture surgery  2011    Left tibial ORIF by Dr. Lajoyce Corners  . Ankle fusion  2011    Left ankle    Family History  Problem Relation Age of Onset  . Cancer Mother     stomach cancer  . Heart disease Brother   . Heart disease Brother   . Heart disease Brother     History  Substance Use Topics  . Smoking status: Former Smoker    Quit date: 04/08/1996  . Smokeless tobacco: Not on file  . Alcohol Use: No     Comment: heavy alcohol use in the past      Review of Systems  Constitutional: Negative for diaphoresis.  Respiratory: Negative for cough and shortness of breath.   Cardiovascular: Positive for chest pain.  Gastrointestinal: Positive for nausea. Negative for vomiting and abdominal pain.  Neurological: Negative for weakness and headaches.  All other systems reviewed and are negative.    Allergies  Morphine and related and Codeine  Home Medications   Current Outpatient Rx  Name  Route  Sig  Dispense  Refill  . acetaminophen (TYLENOL) 650 MG CR tablet   Oral   Take 1,300 mg by mouth every 8 (eight) hours as needed for pain.         Marland Kitchen atorvastatin (LIPITOR) 40 MG tablet   Oral   Take 40 mg by mouth daily.          . citalopram (CELEXA) 10 MG tablet   Oral   Take 10 mg by mouth daily.          . dabigatran (PRADAXA) 150 MG CAPS   Oral   Take 1 capsule (150 mg total) by mouth 2 (two) times daily.   60 capsule   3   . feeding supplement (ENSURE COMPLETE) LIQD   Oral   Take 237 mLs by mouth 2 (two) times daily between meals. Available over the counter   30 Bottle   3   . meclizine (ANTIVERT) 25 MG tablet   Oral   Take 25 mg by mouth daily.         . metoprolol succinate (TOPROL-XL) 25 MG 24 hr tablet   Oral   Take 25 mg by mouth daily.           . Multiple Vitamin (MULTIVITAMIN WITH MINERALS) TABS   Oral   Take 1 tablet by mouth every evening.         . nitroGLYCERIN (NITROSTAT) 0.4 MG SL tablet   Sublingual   Place 0.4 mg under the tongue every 5 (five) minutes as needed for chest pain.         Marland Kitchen triamterene-hydrochlorothiazide (MAXZIDE-25) 37.5-25 MG per tablet   Oral   Take 1 tablet by mouth daily.         . budesonide-formoterol (SYMBICORT) 160-4.5 MCG/ACT inhaler   Inhalation   Inhale 2 puffs into the lungs 2 (two) times daily.         . promethazine (PHENERGAN) 25 MG tablet   Oral   Take 1 tablet (25 mg total) by mouth every 6 (six) hours as needed for nausea.   20 tablet   0   . tiotropium (SPIRIVA) 18 MCG inhalation capsule   Inhalation   Place 18 mcg into inhaler and inhale daily.           There were no vitals taken for this visit.  Physical Exam  Nursing note and vitals reviewed. Constitutional: He is oriented to person, place, and time. He appears well-developed and well-nourished. No distress.  HENT:  Head: Normocephalic and atraumatic.  Mouth/Throat: Oropharynx is clear and moist.  Eyes: Conjunctivae and EOM are normal. Pupils are equal, round, and reactive to light.  Neck: Normal range of motion. Neck supple.  Cardiovascular: Normal rate and intact distal pulses.  An irregularly irregular rhythm present.  No murmur heard. Pulmonary/Chest: Effort normal. No respiratory distress. He has decreased breath sounds in the right middle field and the right lower field. He has no wheezes. He has no rales.  Abdominal: Soft. He exhibits no distension. There is no tenderness. There is no rebound and no guarding.  Musculoskeletal: Normal range of motion. He exhibits no edema and no tenderness.  Neurological: He is alert and oriented to person, place, and time.  Skin: Skin is warm and dry. No rash noted. No erythema.  Psychiatric: He has a normal mood and affect. His behavior is normal.     ED Course  Procedures (including critical care time)  Labs Reviewed  CBC WITH DIFFERENTIAL - Abnormal; Notable for  the following:    Hemoglobin 11.9 (*)    HCT 36.2 (*)    RDW 16.0 (*)    Monocytes Relative 15 (*)    Monocytes Absolute 1.1 (*)    All other components within normal limits  COMPREHENSIVE METABOLIC PANEL - Abnormal; Notable for the following:    CO2 35 (*)    GFR calc non Af Amer 47 (*)    GFR calc Af Amer 55 (*)    All other components within normal limits  PROTIME-INR - Abnormal; Notable for the following:    Prothrombin Time 16.3 (*)    All other components within normal limits  APTT - Abnormal; Notable for the following:    aPTT 53 (*)    All other components within normal limits  POCT I-STAT TROPONIN I   Dg Chest 2 View  08/05/2012  *RADIOLOGY REPORT*  Clinical Data: Left chest pain shortness of breath  CHEST - 2 VIEW  Comparison: CT scan from 03/11/2011 and chest x-ray from 02/05/2010  Findings: Marked asymmetric elevation of the right hemidiaphragm is new in the interval.  There is a right base atelectasis or infiltrate.  No evidence for focal consolidation in the left lung. No overt pulmonary edema. The cardiopericardial silhouette is enlarged.  The patient is status post CABG bones are diffusely demineralized.  IMPRESSION: Asymmetric elevation of the right hemidiaphragm with right base collapse / consolidation.  Follow-up recommended to ensure clearing of the medial right base.   Original Report Authenticated By: Kennith Center, M.D.      Date: 08/05/2012  Rate: 78  Rhythm: atrial fibrillation  QRS Axis: normal  Intervals: normal  ST/T Wave abnormalities: nonspecific ST/T changes  Conduction Disutrbances:none  Narrative Interpretation:   Old EKG Reviewed: unchanged    No diagnosis found.    MDM   Patient with chest pain today that started approximately one hour prior to arrival that resolved after taking 2 nitroglycerin and 325 of aspirin.  History is concerning for possible ACS. Patient has a history of CABG no catheterization since that time. He currently is pain-free and has no symptoms.  Patient states he has known issues with his right lung with fluid and collapse and states that that has been ongoing for years. He denies any shortness of breath or new symptoms such as pain on the right side. 4:17 PM All initial labs wnl.  However due to concerning sx and prior history will discuss with cardiology about a rule out.     Gwyneth Sprout, MD 08/05/12 [id number]

## 2012-08-05 NOTE — ED Notes (Signed)
Patient transported to X-ray 

## 2012-08-06 ENCOUNTER — Encounter (HOSPITAL_COMMUNITY): Payer: Self-pay | Admitting: General Practice

## 2012-08-06 DIAGNOSIS — I200 Unstable angina: Secondary | ICD-10-CM | POA: Diagnosis present

## 2012-08-06 DIAGNOSIS — C434 Malignant melanoma of scalp and neck: Secondary | ICD-10-CM | POA: Insufficient documentation

## 2012-08-06 LAB — BASIC METABOLIC PANEL
Calcium: 9.6 mg/dL (ref 8.4–10.5)
GFR calc Af Amer: 63 mL/min — ABNORMAL LOW (ref >90)
GFR calc non Af Amer: 54 mL/min — ABNORMAL LOW (ref >90)
Potassium: 3.7 mEq/L (ref 3.5–5.1)
Sodium: 138 mEq/L (ref 135–145)

## 2012-08-06 LAB — TROPONIN I
Troponin I: <0.3 ng/mL (ref <0.30)
Troponin I: <0.3 ng/mL (ref <0.30)

## 2012-08-06 LAB — CBC
MCHC: 32.7 g/dL (ref 30.0–36.0)
RDW: 15.8 % — ABNORMAL HIGH (ref 11.5–15.5)

## 2012-08-06 MED ORDER — ISOSORBIDE MONONITRATE ER 60 MG PO TB24
60.0000 mg | ORAL_TABLET | Freq: Every day | ORAL | Status: DC
  Administered 2012-08-06 – 2012-08-07 (×2): 60 mg via ORAL
  Filled 2012-08-06 (×2): qty 1

## 2012-08-06 MED ORDER — METOPROLOL SUCCINATE ER 50 MG PO TB24
50.0000 mg | ORAL_TABLET | Freq: Every day | ORAL | Status: DC
  Administered 2012-08-06 – 2012-08-07 (×2): 50 mg via ORAL
  Filled 2012-08-06 (×2): qty 1

## 2012-08-06 NOTE — Progress Notes (Addendum)
SATURATION QUALIFICATIONS: (This note is used to comply with regulatory documentation for home oxygen)  Patient Saturations on Room Air at Rest = 91%  Patient Saturations on Room Air while Ambulating = 91%  Patient Saturations on 0 Liters of oxygen while Ambulating = 91-92%  Please briefly explain why patient needs home oxygen:  Dr. Donnie Aho notified patient does not meet criteria for home O2.  Will continue to monitor. [place], Mitzi Hansen

## 2012-08-06 NOTE — Progress Notes (Signed)
Subjective:  He is free of pain today and feels well. Not SOB.  History reviewed  Objective:  Vital Signs in the last 24 hours: BP 132/82  Pulse 84  Temp(Src) 97.6 F (36.4 C) (Oral)  Resp 18  Ht 5\' 9"  (1.753 m)  Wt 82.283 kg (181 lb 6.4 oz)  BMI 26.78 kg/m2  SpO2 95%  Physical Exam: Pleasant WM in NAD Lungs:  Clear Cardiac:  Regular rhythm, normal S1 and S2, no S3 1-2/6 systolic murmur Extremities:  No edema present  Intake/Output from previous day: 04/30 0701 - 05/01 0700 In: -  Out: 800 [Urine:800]  Weight Filed Weights   08/05/12 1700  Weight: 82.283 kg (181 lb 6.4 oz)    Lab Results: Basic Metabolic Panel:  Recent Labs  40/98/11 1513 08/06/12 0304  NA 136 138  K 4.0 3.7  CL 96 99  CO2 35* 32  GLUCOSE 99 99  BUN 16 17  CREATININE 1.32 1.18   CBC:  Recent Labs  08/05/12 1513 08/06/12 0304  WBC 7.4 6.9  NEUTROABS 4.9  --   HGB 11.9* 11.7*  HCT 36.2* 35.8*  MCV 82.3 81.9  PLT 207 184   Cardiac Enzymes:  Recent Labs  08/05/12 1623 08/05/12 2133 08/06/12 0304  TROPONINI <0.30 <0.30 <0.30    Telemetry: Sinus rhythm  Assessment/Plan:  1. Isolated prolonged episode of chest pain in a patient with previous bypass grafting. His mammary graft was atrophic but he had patent vein grafts to the diagonal, posterolateral branch, and right coronary artery in 2003. It is possible that he could have progressive vein graft disease but has significant comorbidities with a recent diagnosis of melanoma for which he is due to undergo radiation therapy as well as his age of 45.  My thought at this time is that he should be treated intensively medically. He would not be a candidate for repeat coronary bypass grafting at his age and with his diagnosis of melanoma. If he was to have refractory angina despite medical therapy he could be considered for catheterization and salvage stenting for relief of symptoms. I do not think the risk stratification with nuclear  testing will be helpful in this situation.  I think that he could go ahead and eat. The other possibility is whether the Pradaxa that is on may be causing some GI distress and might be worth changing him to either XARELTO or Eliquus. I would go ahead and start long-acting nitrates on him. He should stay in the hospital an additional day and ambulate.  Darden Palmer  MD [facility]  08/06/2012, 9:20 AM

## 2012-08-06 NOTE — Progress Notes (Signed)
Patient ambulated in hallway (greater than 500 feet) on 2L of oxygen.  Patient tolerated ambulation well.  Will continue to monitor. [place], Mitzi Hansen

## 2012-08-07 MED ORDER — ISOSORBIDE MONONITRATE ER 60 MG PO TB24: 60.0000 mg | ORAL_TABLET | Freq: Every day | ORAL | Status: DC

## 2012-08-07 MED ORDER — ASPIRIN 81 MG PO TBEC: 81.0000 mg | DELAYED_RELEASE_TABLET | Freq: Every day | ORAL | Status: DC

## 2012-08-07 NOTE — Discharge Summary (Signed)
Physician Discharge Summary  Patient ID: Gabriel Wood MRN: [id number] DOB/AGE: 1923/12/28 79 y.o.  Admit date: 08/05/2012 Discharge date: 08/07/2012  Primary Physician:  Dr. Blair Heys  Primary Discharge Diagnosis: 1. Chest pain consistent with unstable angina pectoris  Secondary Discharge Diagnosis: 2. Coronary artery disease with previous bypass graft  3. Malignant melanoma under treatment 4. Hyperlipidemia under treatment 5. Chronic atrial fibrillation 6. Long-term anticoagulation with warfarin 7. Hypertension  Hospital Course: This 79 year old male has a history of coronary artery disease with bypass grafting in 1999 catheterization in 2003 showed his vein grafts to be patent but his internal mammary graft was atretic. He has done well since then but has had chronic atrial fibrillation managed with anticoagulation. He is under treatment for malignant melanoma and currently is due to undergo radiation therapy for this. He recently had a transient ischemic attack and was changed to Pradaxa and was taken off of aspirin. He presented to the emergency room with a prolonged episode of substernal chest discomfort suggestive of angina but was unrelieved with 2 nitroglycerin.  He was brought in to the hospital and observed overnight. He had no recurrence of chest pain and his enzymes were negative. His EKG showed no ischemic changes. In light of his age it was opted to do intensive medical treatment. He is not a candidate for repeat bypass grafting and would reserve catheterization or intervention for refractory symptoms despite medical therapy in light of his malignant melanoma.  Thus he was not felt to be a candidate for stress testing. He was placed back on aspirin and was started on Imdur 60 mg daily and felt much better. He is discharged at home in improved condition and will followup in the office on Tuesday.  Discharge Exam: Blood pressure 104/67, pulse 84, temperature 97.2 F  (36.2 C), temperature source Oral, resp. rate 16, height 5\' 9"  (1.753 m), weight 82.283 kg (181 lb 6.4 oz), SpO2 97.00%.   Reduced breath sounds at bases, irregular heart rate  Labs: CBC:   Lab Results  Component Value Date   WBC 6.9 08/06/2012   HGB 11.7* 08/06/2012   HCT 35.8* 08/06/2012   MCV 81.9 08/06/2012   PLT 184 08/06/2012   CMP:  Recent Labs Lab 08/05/12 1513 08/06/12 0304  NA 136 138  K 4.0 3.7  CL 96 99  CO2 35* 32  BUN 16 17  CREATININE 1.32 1.18  CALCIUM 10.0 9.6  PROT 6.4  --   BILITOT 0.5  --   ALKPHOS 68  --   ALT 9  --   AST 15  --   GLUCOSE 99 99   Lipid Panel     Component Value Date/Time   CHOL 104 07/31/2012 0620   TRIG 126 07/31/2012 0620   HDL 35* 07/31/2012 0620   CHOLHDL 3.0 07/31/2012 0620   VLDL 25 07/31/2012 0620   LDLCALC 44 07/31/2012 0620   Cardiac Enzymes:  Recent Labs  08/05/12 2133 08/06/12 0304 08/06/12 0825  TROPONINI <0.30 <0.30 <0.30    Radiology: Elevation of right hemidiaphragm, basilar atelectasis  EKG: Atrial fibrillation with controlled ventricular response  Discharge Medications:   Medication List    TAKE these medications       acetaminophen 650 MG CR tablet  Commonly known as:  TYLENOL  Take 1,300 mg by mouth every 8 (eight) hours as needed for pain.     aspirin 81 MG EC tablet  Take 1 tablet (81 mg total) by mouth daily.  atorvastatin 40 MG tablet  Commonly known as:  LIPITOR  Take 40 mg by mouth daily.     budesonide-formoterol 160-4.5 MCG/ACT inhaler  Commonly known as:  SYMBICORT  Inhale 2 puffs into the lungs 2 (two) times daily.     citalopram 10 MG tablet  Commonly known as:  CELEXA  Take 10 mg by mouth daily.     dabigatran 150 MG Caps  Commonly known as:  PRADAXA  Take 1 capsule (150 mg total) by mouth 2 (two) times daily.     feeding supplement Liqd  Take 237 mLs by mouth 2 (two) times daily between meals. Available over the counter     isosorbide mononitrate 60 MG 24 hr tablet   Commonly known as:  IMDUR  Take 1 tablet (60 mg total) by mouth daily.     meclizine 25 MG tablet  Commonly known as:  ANTIVERT  Take 25 mg by mouth daily.     metoprolol succinate 25 MG 24 hr tablet  Commonly known as:  TOPROL-XL  Take 25 mg by mouth daily.     multivitamin with minerals Tabs  Take 1 tablet by mouth every evening.     nitroGLYCERIN 0.4 MG SL tablet  Commonly known as:  NITROSTAT  Place 0.4 mg under the tongue every 5 (five) minutes as needed for chest pain.     promethazine 25 MG tablet  Commonly known as:  PHENERGAN  Take 1 tablet (25 mg total) by mouth every 6 (six) hours as needed for nausea.     tiotropium 18 MCG inhalation capsule  Commonly known as:  SPIRIVA  Place 18 mcg into inhaler and inhale daily.     triamterene-hydrochlorothiazide 37.5-25 MG per tablet  Commonly known as:  MAXZIDE-25  Take 1 tablet by mouth daily.        Followup plans and appointments: Keep appointment with Dr. Donnie Aho on Tuesday   Time spent with patient to include physician time: 30 minutes  Signed: W. Ashley Royalty. MD [facility] 08/07/2012, 11:59 AM

## 2012-08-07 NOTE — Progress Notes (Signed)
Pts assessment unchanged from this am. D/c'd via wheelchair to private vehicle with brother

## 2012-12-01 ENCOUNTER — Other Ambulatory Visit: Payer: Self-pay | Admitting: Family Medicine

## 2012-12-01 ENCOUNTER — Ambulatory Visit
Admission: RE | Admit: 2012-12-01 | Discharge: 2012-12-01 | Disposition: A | Discharge status: Home / Self Care | Payer: Medicare Other | Source: Ambulatory Visit | Attending: Family Medicine | Admitting: Family Medicine

## 2012-12-01 DIAGNOSIS — R0600 Dyspnea, unspecified: Secondary | ICD-10-CM

## 2013-03-08 DIAGNOSIS — W19XXXA Unspecified fall, initial encounter: Secondary | ICD-10-CM

## 2013-03-08 DIAGNOSIS — Y92009 Unspecified place in unspecified non-institutional (private) residence as the place of occurrence of the external cause: Secondary | ICD-10-CM

## 2013-03-08 HISTORY — DX: Unspecified fall, initial encounter: W19.XXXA

## 2013-03-08 HISTORY — DX: Unspecified fall, initial encounter: Y92.009

## 2013-03-08 HISTORY — PX: TOTAL SHOULDER REPLACEMENT: SUR1217

## 2013-03-22 ENCOUNTER — Ambulatory Visit: Payer: Medicare Other | Admitting: Neurosurgery

## 2013-03-22 ENCOUNTER — Other Ambulatory Visit: Payer: Medicare Other

## 2013-03-29 ENCOUNTER — Other Ambulatory Visit (HOSPITAL_COMMUNITY): Payer: Medicare Other

## 2013-03-29 ENCOUNTER — Ambulatory Visit: Payer: Medicare Other | Admitting: Family

## 2013-04-08 DIAGNOSIS — I639 Cerebral infarction, unspecified: Secondary | ICD-10-CM

## 2013-04-08 HISTORY — DX: Cerebral infarction, unspecified: I63.9

## 2013-04-16 ENCOUNTER — Encounter: Payer: Self-pay | Admitting: Family

## 2013-04-19 ENCOUNTER — Ambulatory Visit (INDEPENDENT_AMBULATORY_CARE_PROVIDER_SITE_OTHER): Payer: Medicare Other | Admitting: Family

## 2013-04-19 ENCOUNTER — Encounter: Payer: Self-pay | Admitting: Family

## 2013-04-19 ENCOUNTER — Other Ambulatory Visit: Payer: Self-pay | Admitting: Neurosurgery

## 2013-04-19 ENCOUNTER — Ambulatory Visit (HOSPITAL_COMMUNITY)
Admission: RE | Admit: 2013-04-19 | Discharge: 2013-04-19 | Disposition: A | Discharge status: Home / Self Care | Payer: Medicare Other | Source: Ambulatory Visit | Attending: Family | Admitting: Family

## 2013-04-19 ENCOUNTER — Telehealth: Payer: Self-pay

## 2013-04-19 VITALS — BP 94/58 | HR 81 | Resp 16 | Ht 68.5 in | Wt 176.0 lb

## 2013-04-19 DIAGNOSIS — I714 Abdominal aortic aneurysm, without rupture, unspecified: Secondary | ICD-10-CM

## 2013-04-19 DIAGNOSIS — I77811 Abdominal aortic ectasia: Secondary | ICD-10-CM

## 2013-04-19 DIAGNOSIS — Z48812 Encounter for surgical aftercare following surgery on the circulatory system: Secondary | ICD-10-CM

## 2013-04-19 NOTE — Progress Notes (Signed)
[facility] [place]  Established Abdominal Aortic Aneurysm  History of Present Illness  Gabriel Wood is a 80 y.o. ([birth year]) male patient of Dr. Myra Gianotti followed for known small AAA, he returns today for surveillance. Previous studies demonstrate an AAA, measuring 2.42 cm a year ago.  The patient does denies back or abdominal pain.  The patient is not a smoker. The patient denies claudication in legs with walking. The patient reports history of stroke or TIA symptoms about 5-6 months ago in his cardiologist office as manifested by garbled speech, he was evaluated at [facility], states he was told that he had a TIA. He takes Pradaxa for atrial fib, managed by Dr. Donnie Aho. He fell 2 weeks ago, attributes to standing too quickly and becoming light-headed, hit his head on his carpeted floor, denies LOC, states he did not get this evaluated, states his PCP is not aware of this. He denies any neurologic changes, denies change in vision. Had left shoulder surgery in the last year for fall over a year ago.  Pt Diabetic: No Pt smoker: former smoker, quit in 1998  Past Medical History  Diagnosis Date  . History of colon polyps   . Anemia   . Chronic kidney disease     History of Kidney Stones  . Ulcer     history of Peptic Ulcer Disease  . IBS (irritable bowel syndrome)   . Anxiety   . BPH (benign prostatic hypertrophy)   . CAD (coronary artery disease)   . Atrial fibrillation   . AAA (abdominal aortic aneurysm)   . Arthritis   . Chronic anticoagulation     On Coumadin therapy ( A Fib)  . Hypertension   . Hyperlipidemia   . History of DVT of lower extremity   . Cancer Sept. 2013    Melanoma,Right  posterior shoulder  . Anginal pain   . Shortness of breath   . Transient ischemic attack (TIA) 07/2012   Past Surgical History  Procedure Laterality Date  . Appendectomy    . Cholecystectomy    . Tonsillectomy    . Rotator cuff repair      Right shoulder  .  Coronary artery bypass graft    . Joint replacement      Left shoulder replacement  . Appendectomy    . Fracture surgery  2011    Left tibial ORIF by Dr. Lajoyce Corners  . Ankle fusion  2011    Left ankle  . Lymph node biopsy     Social History History   Social History  . Marital Status: Widowed    Spouse Name: N/A    Number of Children: N/A  . Years of Education: N/A   Occupational History  . Not on file.   Social History Main Topics  . Smoking status: Former Smoker    Quit date: 04/08/1996  . Smokeless tobacco: Never Used  . Alcohol Use: No     Comment: heavy alcohol use in the past  . Drug Use: No  . Sexual Activity: Not on file   Other Topics Concern  . Not on file   Social History Narrative  . No narrative on file   Family History Family History  Problem Relation Age of Onset  . Cancer Mother     stomach cancer  . Heart disease Brother   . Heart disease Brother   . Heart disease Brother     Current Outpatient Prescriptions on File Prior to Visit  Medication  Sig Dispense Refill  . acetaminophen (TYLENOL) 650 MG CR tablet Take 1,300 mg by mouth every 8 (eight) hours as needed for pain.      Marland Kitchen aspirin EC 81 MG EC tablet Take 1 tablet (81 mg total) by mouth daily.      Marland Kitchen atorvastatin (LIPITOR) 40 MG tablet Take 40 mg by mouth daily.       . budesonide-formoterol (SYMBICORT) 160-4.5 MCG/ACT inhaler Inhale 2 puffs into the lungs 2 (two) times daily.      . citalopram (CELEXA) 10 MG tablet Take 10 mg by mouth daily.       . dabigatran (PRADAXA) 150 MG CAPS Take 1 capsule (150 mg total) by mouth 2 (two) times daily.  60 capsule  3  . feeding supplement (ENSURE COMPLETE) LIQD Take 237 mLs by mouth 2 (two) times daily between meals. Available over the counter  30 Bottle  3  . isosorbide mononitrate (IMDUR) 60 MG 24 hr tablet Take 1 tablet (60 mg total) by mouth daily.  30 tablet  12  . meclizine (ANTIVERT) 25 MG tablet Take 25 mg by mouth daily.      . metoprolol succinate  (TOPROL-XL) 25 MG 24 hr tablet Take 25 mg by mouth daily.       . Multiple Vitamin (MULTIVITAMIN WITH MINERALS) TABS Take 1 tablet by mouth every evening.      . nitroGLYCERIN (NITROSTAT) 0.4 MG SL tablet Place 0.4 mg under the tongue every 5 (five) minutes as needed for chest pain.      . promethazine (PHENERGAN) 25 MG tablet Take 1 tablet (25 mg total) by mouth every 6 (six) hours as needed for nausea.  20 tablet  0  . tiotropium (SPIRIVA) 18 MCG inhalation capsule Place 18 mcg into inhaler and inhale daily.      Marland Kitchen triamterene-hydrochlorothiazide (MAXZIDE-25) 37.5-25 MG per tablet Take 1 tablet by mouth daily.       No current facility-administered medications on file prior to visit.   Allergies  Allergen Reactions  . Morphine And Related Palpitations  . Codeine Nausea Only    ROS: See HPI for pertinent positives and negatives.  Physical Examination Filed Vitals:   04/19/13 1017  BP: 94/58  Pulse: 81  Resp: 16   Filed Weights   04/19/13 1017  Weight: 176 lb (79.833 kg)   Body mass index is 26.37 kg/(m^2).  General: A&O x 3, WD.  Pulmonary: Sym exp, good air movt, CTAB, no rales, rhonchi, or wheezing.   Cardiac: RRR, Nl S1, S2, no Murmurs, rubs or gallops.  Carotid Bruits Left Right   Negative Negative   Aorta is is not palpable. Radial pulses are are 2+ and =                          VASCULAR EXAM:                                                                                                         LE Pulses LEFT RIGHT  FEMORAL  not palpable  palpable        POPLITEAL  not palpable   not palpable       POSTERIOR TIBIAL   palpable   not palpable        DORSALIS PEDIS      ANTERIOR TIBIAL not palpable   palpable      Gastrointestinal: soft, NTND, -G/R, - HSM, - masses, - CVAT B  Musculoskeletal: M/S 4/5 throughout, Extremities without ischemic changes.  Mild bruising around orbits.  Neurologic: CN 2-12 intact  except mild deviation of tongue to  right , Pain and light touch intact in extremities, Motor exam as listed above.  Non-Invasive Vascular Imaging  AAA Duplex (04/19/2013)  Previous size: 2.42 cm (Date: 03/16/2012)  Current size:  2.8 cm (Date: 04/19/2013)  Carotid Duplex (07/31/12): There is no obvious evidence of hemodynamically significant internal carotid artery stenosis >40%. The right vertebral artery is patent with antegrade flow. Unable to visualize the left vertebral artery.   Medical Decision Making  The patient is a 80 y.o. male who presents with asymptomatic small AAA with slight increase in size. No significant ICA stenosis from 07/31/12 carotid Duplex done at [facility] by the stroke team.  Patient almost fell getting on to the exam table, is somewhat unsteady on his feet at times. I advised him to call his PCP's office today ASAP to let that office know of his fall about 2 weeks ago in which he hit his head, denies LOC at that time, and to make an appointment with his PCP's office. Also our office will notify his PCP's office today of the above fall.   Based on this patient's exam and diagnostic studies, the patient will follow up in 12 months  with the following studies: AAA Duplex and carotid Duplex.  The threshold for repair is AAA size > 5.5 cm, growth > 1 cm/yr, and symptomatic status.  I emphasized the importance of maximal medical management including strict control of blood pressure, blood glucose, and lipid levels, antiplatelet agents, obtaining regular exercise, and cessation of smoking.   The patient was given information about AAA including signs, symptoms, treatment, and how to minimize the risk of enlargement and rupture of aneurysms.    The patient was advised to call 911 should the patient experience sudden onset abdominal or back pain.   Thank you for allowing [place] to participate in this patient's care.  Clemon Chambers, RN, MSN, FNP-C [facility] [place] Office:  [facility] Physician: Trula Slade  04/19/2013, 9:33 AM

## 2013-04-19 NOTE — Patient Instructions (Addendum)
Abdominal Aortic Aneurysm An aneurysm is a weakened or damaged part of an artery wall that bulges from the normal force of blood pumping through the body. An abdominal aortic aneurysm is an aneurysm that occurs in the lower part of the aorta, the main artery of the body.  The major concern with an abdominal aortic aneurysm is that it can enlarge and burst (rupture) or blood can flow between the layers of the wall of the aorta through a tear (aorticdissection). Both of these conditions can cause bleeding inside the body and can be life threatening unless diagnosed and treated promptly. CAUSES  The exact cause of an abdominal aortic aneurysm is unknown. Some contributing factors are:   A hardening of the arteries caused by the buildup of fat and other substances in the lining of a blood vessel (arteriosclerosis).  Inflammation of the walls of an artery (arteritis).   Connective tissue diseases, such as Marfan syndrome.   Abdominal trauma.   An infection, such as syphilis or staphylococcus, in the wall of the aorta (infectious aortitis) caused by bacteria. RISK FACTORS  Risk factors that contribute to an abdominal aortic aneurysm may include:  Age older than 60 years.   High blood pressure (hypertension).  Male gender.  Ethnicity (white race).  Obesity.  Family history of aneurysm (first degree relatives only).  Tobacco use. PREVENTION  The following healthy lifestyle habits may help decrease your risk of abdominal aortic aneurysm:  Quitting smoking. Smoking can raise your blood pressure and cause arteriosclerosis.  Limiting or avoiding alcohol.  Keeping your blood pressure, blood sugar level, and cholesterol levels within normal limits.  Decreasing your salt intake. In somepeople, too much salt can raise blood pressure and increase your risk of abdominal aortic aneurysm.  Eating a diet low in saturated fats and cholesterol.  Increasing your fiber intake by including  whole grains, vegetables, and fruits in your diet. Eating these foods may help lower blood pressure.  Maintaining a healthy weight.  Staying physically active and exercising regularly. SYMPTOMS  The symptoms of abdominal aortic aneurysm may vary depending on the size and rate of growth of the aneurysm.Most grow slowly and do not have any symptoms. When symptoms do occur, they may include:  Pain (abdomen, side, lower back, or groin). The pain may vary in intensity. A sudden onset of severe pain may indicate that the aneurysm has ruptured.  Feeling full after eating only small amounts of food.  Nausea or vomiting or both.  Feeling a pulsating lump in the abdomen.  Feeling faint or passing out. DIAGNOSIS  Since most unruptured abdominal aortic aneurysms have no symptoms, they are often discovered during diagnostic exams for other conditions. An aneurysm may be found during the following procedures:  Ultrasonography (A one-time screening for abdominal aortic aneurysm by ultrasonography is also recommended for all men aged 65-75 years who have ever smoked).  X-ray exams.  A computed tomography (CT).  Magnetic resonance imaging (MRI).  Angiography or arteriography. TREATMENT  Treatment of an abdominal aortic aneurysm depends on the size of your aneurysm, your age, and risk factors for rupture. Medication to control blood pressure and pain may be used to manage aneurysms smaller than 6 cm. Regular monitoring for enlargement may be recommended by your caregiver if:  The aneurysm is 3 4 cm in size (an annual ultrasonography may be recommended).  The aneurysm is 4 4.5 cm in size (an ultrasonography every 6 months may be recommended).  The aneurysm is larger than 4.5   cm in size (your caregiver may ask that you be examined by a vascular surgeon). If your aneurysm is larger than 6 cm, surgical repair may be recommended. There are two main methods for repair of an aneurysm:   Endovascular  repair (a minimally invasive surgery). This is done most often.  Open repair. This method is used if an endovascular repair is not possible. Document Released: 01/02/2005 Document Revised: 07/20/2012 Document Reviewed: 04/24/2012 [facility] Patient Information 2014 [place], [place].   Stroke Prevention Some medical conditions and behaviors are associated with an increased chance of having a stroke. You may prevent a stroke by making healthy choices and managing medical conditions. HOW CAN I REDUCE MY RISK OF HAVING A STROKE?   Stay physically active. Get at least 30 minutes of activity on most or all days.  Do not smoke. It may also be helpful to avoid exposure to secondhand smoke.  Limit alcohol use. Moderate alcohol use is considered to be:  No more than 2 drinks per day for men.  No more than 1 drink per day for nonpregnant women.  Eat healthy foods. This involves  Eating 5 or more servings of fruits and vegetables a day.  Following a diet that addresses high blood pressure (hypertension), high cholesterol, diabetes, or obesity.  Manage your cholesterol levels.  A diet low in saturated fat, trans fat, and cholesterol and high in fiber may control cholesterol levels.  Take any prescribed medicines to control cholesterol as directed by your health care provider.  Manage your diabetes.  A controlled-carbohydrate, controlled-sugar diet is recommended to manage diabetes.  Take any prescribed medicines to control diabetes as directed by your health care provider.  Control your hypertension.  A low-salt (sodium), low-saturated fat, low-trans fat, and low-cholesterol diet is recommended to manage hypertension.  Take any prescribed medicines to control hypertension as directed by your health care provider.  Maintain a healthy weight.  A reduced-calorie, low-sodium, low-saturated fat, low-trans fat, low-cholesterol diet is recommended to manage weight.  Stop drug  abuse.  Avoid taking birth control pills.  Talk to your health care provider about the risks of taking birth control pills if you are over 82 years old, smoke, get migraines, or have ever had a blood clot.  Get evaluated for sleep disorders (sleep apnea).  Talk to your health care provider about getting a sleep evaluation if you snore a lot or have excessive sleepiness.  Take medicines as directed by your health care provider.  For some people, aspirin or blood thinners (anticoagulants) are helpful in reducing the risk of forming abnormal blood clots that can lead to stroke. If you have the irregular heart rhythm of atrial fibrillation, you should be on a blood thinner unless there is a good reason you cannot take them.  Understand all your medicine instructions.  Make sure that other other conditions (such as anemia or atherosclerosis) are addressed. SEEK IMMEDIATE MEDICAL CARE IF:   You have sudden weakness or numbness of the face, arm, or leg, especially on one side of the body.  Your face or eyelid droops to one side.  You have sudden confusion.  You have trouble speaking (aphasia) or understanding.  You have sudden trouble seeing in one or both eyes.  You have sudden trouble walking.  You have dizziness.  You have a loss of balance or coordination.  You have a sudden, severe headache with no known cause.  You have new chest pain or an irregular heartbeat. Any of these symptoms  may represent a serious problem that is an emergency. Do not wait to see if the symptoms will go away. Get medical help at once. Call your local emergency services  (911 in [place] Do not drive yourself to the hospital. Document Released: 05/02/2004 Document Revised: 01/13/2013 Document Reviewed: 09/25/2012 [facility] Patient Information 2014 [place].

## 2013-04-19 NOTE — Telephone Encounter (Signed)
Message copied by Denman George on Mon Apr 19, 2013 11:34 AM ------      Message from: Viann Fish      Created: Mon Apr 19, 2013 10:53 AM      Regarding: fell almost 2 weeks ago, on Pradaxa for atroial [employer],      Pt fell almost 2 weeks ago due to what sounds like orthostatic hypotension, and states he was not evaluated.      He hit his head, denies LOC, but since he is on Pradaxa his PCP should be made aware.      I asked pt to call his PCP today ASAP to let that office know and to make an appointment.      Would you pleas notify his office of the above?      He denies any change in neuro status, no change in vision.      Thanks, Vinnie Level       ------

## 2013-04-19 NOTE — Telephone Encounter (Signed)
Call placed to [facility]; attempted to speak with Dr. Andrew Au nurse.  Nurse was not available to take phone call at present.  Spoke with receptionist.  She took a verbal message re: report of pt. Having fallen 2 weeks ago, and hit his head on carpeted floor.  Informed Receptionist that S. Nickel requested Dr. Marisue Humble be made aware ASAP, as pt. Is on Pradaxa.  Faxed [facility] progress note to Dr. Marisue Humble.  Receptionist verb. Understanding Of need to inform Dr. Marisue Humble and his nurse of pt's recent fall.

## 2013-05-03 ENCOUNTER — Other Ambulatory Visit: Payer: Self-pay | Admitting: Family Medicine

## 2013-05-03 ENCOUNTER — Ambulatory Visit
Admission: RE | Admit: 2013-05-03 | Discharge: 2013-05-03 | Disposition: A | Discharge status: Home / Self Care | Payer: Medicare Other | Source: Ambulatory Visit | Attending: Family Medicine | Admitting: Family Medicine

## 2013-05-03 DIAGNOSIS — R0600 Dyspnea, unspecified: Secondary | ICD-10-CM

## 2013-05-03 DIAGNOSIS — S299XXA Unspecified injury of thorax, initial encounter: Secondary | ICD-10-CM

## 2013-05-04 ENCOUNTER — Encounter (HOSPITAL_COMMUNITY): Payer: Self-pay | Admitting: Emergency Medicine

## 2013-05-04 ENCOUNTER — Emergency Department (HOSPITAL_COMMUNITY): Payer: Medicare Other

## 2013-05-04 ENCOUNTER — Emergency Department (HOSPITAL_COMMUNITY)
Admission: EM | Admit: 2013-05-04 | Discharge: 2013-05-04 | Disposition: A | Discharge status: Home / Self Care | Payer: Medicare Other | Attending: Emergency Medicine | Admitting: Emergency Medicine

## 2013-05-04 DIAGNOSIS — R0600 Dyspnea, unspecified: Secondary | ICD-10-CM

## 2013-05-04 DIAGNOSIS — Y929 Unspecified place or not applicable: Secondary | ICD-10-CM | POA: Insufficient documentation

## 2013-05-04 DIAGNOSIS — Z85820 Personal history of malignant melanoma of skin: Secondary | ICD-10-CM | POA: Insufficient documentation

## 2013-05-04 DIAGNOSIS — E785 Hyperlipidemia, unspecified: Secondary | ICD-10-CM | POA: Insufficient documentation

## 2013-05-04 DIAGNOSIS — Z7982 Long term (current) use of aspirin: Secondary | ICD-10-CM | POA: Insufficient documentation

## 2013-05-04 DIAGNOSIS — I4891 Unspecified atrial fibrillation: Secondary | ICD-10-CM | POA: Insufficient documentation

## 2013-05-04 DIAGNOSIS — R0609 Other forms of dyspnea: Secondary | ICD-10-CM | POA: Insufficient documentation

## 2013-05-04 DIAGNOSIS — Z9981 Dependence on supplemental oxygen: Secondary | ICD-10-CM | POA: Insufficient documentation

## 2013-05-04 DIAGNOSIS — Y939 Activity, unspecified: Secondary | ICD-10-CM | POA: Insufficient documentation

## 2013-05-04 DIAGNOSIS — Z87448 Personal history of other diseases of urinary system: Secondary | ICD-10-CM | POA: Insufficient documentation

## 2013-05-04 DIAGNOSIS — Z860100 Personal history of colon polyps, unspecified: Secondary | ICD-10-CM | POA: Insufficient documentation

## 2013-05-04 DIAGNOSIS — Z872 Personal history of diseases of the skin and subcutaneous tissue: Secondary | ICD-10-CM | POA: Insufficient documentation

## 2013-05-04 DIAGNOSIS — I2510 Atherosclerotic heart disease of native coronary artery without angina pectoris: Secondary | ICD-10-CM | POA: Insufficient documentation

## 2013-05-04 DIAGNOSIS — F411 Generalized anxiety disorder: Secondary | ICD-10-CM | POA: Insufficient documentation

## 2013-05-04 DIAGNOSIS — J441 Chronic obstructive pulmonary disease with (acute) exacerbation: Secondary | ICD-10-CM | POA: Insufficient documentation

## 2013-05-04 DIAGNOSIS — W19XXXA Unspecified fall, initial encounter: Secondary | ICD-10-CM

## 2013-05-04 DIAGNOSIS — IMO0002 Reserved for concepts with insufficient information to code with codable children: Secondary | ICD-10-CM | POA: Insufficient documentation

## 2013-05-04 DIAGNOSIS — I509 Heart failure, unspecified: Secondary | ICD-10-CM | POA: Insufficient documentation

## 2013-05-04 DIAGNOSIS — I209 Angina pectoris, unspecified: Secondary | ICD-10-CM | POA: Insufficient documentation

## 2013-05-04 DIAGNOSIS — Z87891 Personal history of nicotine dependence: Secondary | ICD-10-CM | POA: Insufficient documentation

## 2013-05-04 DIAGNOSIS — Z8719 Personal history of other diseases of the digestive system: Secondary | ICD-10-CM | POA: Insufficient documentation

## 2013-05-04 DIAGNOSIS — Z8673 Personal history of transient ischemic attack (TIA), and cerebral infarction without residual deficits: Secondary | ICD-10-CM | POA: Insufficient documentation

## 2013-05-04 DIAGNOSIS — Z87442 Personal history of urinary calculi: Secondary | ICD-10-CM | POA: Insufficient documentation

## 2013-05-04 DIAGNOSIS — M129 Arthropathy, unspecified: Secondary | ICD-10-CM | POA: Insufficient documentation

## 2013-05-04 DIAGNOSIS — Z86718 Personal history of other venous thrombosis and embolism: Secondary | ICD-10-CM | POA: Insufficient documentation

## 2013-05-04 DIAGNOSIS — Z85828 Personal history of other malignant neoplasm of skin: Secondary | ICD-10-CM | POA: Insufficient documentation

## 2013-05-04 DIAGNOSIS — Z79899 Other long term (current) drug therapy: Secondary | ICD-10-CM | POA: Insufficient documentation

## 2013-05-04 DIAGNOSIS — Z951 Presence of aortocoronary bypass graft: Secondary | ICD-10-CM | POA: Insufficient documentation

## 2013-05-04 DIAGNOSIS — Z7901 Long term (current) use of anticoagulants: Secondary | ICD-10-CM | POA: Insufficient documentation

## 2013-05-04 DIAGNOSIS — R0989 Other specified symptoms and signs involving the circulatory and respiratory systems: Principal | ICD-10-CM | POA: Insufficient documentation

## 2013-05-04 DIAGNOSIS — I10 Essential (primary) hypertension: Secondary | ICD-10-CM | POA: Insufficient documentation

## 2013-05-04 DIAGNOSIS — W07XXXA Fall from chair, initial encounter: Secondary | ICD-10-CM | POA: Insufficient documentation

## 2013-05-04 LAB — CBC
HEMATOCRIT: 35.7 % — AB (ref 39.0–52.0)
Hemoglobin: 11.4 g/dL — ABNORMAL LOW (ref 13.0–17.0)
MCH: 26.6 pg (ref 26.0–34.0)
MCHC: 31.9 g/dL (ref 30.0–36.0)
MCV: 83.4 fL (ref 78.0–100.0)
PLATELETS: 202 10*3/uL (ref 150–400)
RBC: 4.28 MIL/uL (ref 4.22–5.81)
RDW: 17 % — ABNORMAL HIGH (ref 11.5–15.5)
WBC: 7.5 10*3/uL (ref 4.0–10.5)

## 2013-05-04 LAB — BASIC METABOLIC PANEL
BUN: 22 mg/dL (ref 6–23)
CHLORIDE: 98 meq/L (ref 96–112)
CO2: 30 meq/L (ref 19–32)
CREATININE: 1.08 mg/dL (ref 0.50–1.35)
Calcium: 10.1 mg/dL (ref 8.4–10.5)
GFR calc Af Amer: 69 mL/min — ABNORMAL LOW (ref >90)
GFR calc non Af Amer: 60 mL/min — ABNORMAL LOW (ref >90)
Glucose, Bld: 94 mg/dL (ref 70–99)
POTASSIUM: 4.1 meq/L (ref 3.7–5.3)
Sodium: 140 mEq/L (ref 137–147)

## 2013-05-04 LAB — PRO B NATRIURETIC PEPTIDE: PRO B NATRI PEPTIDE: 604.2 pg/mL — AB (ref 0–450)

## 2013-05-04 LAB — POCT I-STAT TROPONIN I: Troponin i, poc: 0 ng/mL (ref 0.00–0.08)

## 2013-05-04 MED ORDER — FUROSEMIDE 10 MG/ML IJ SOLN
20.0000 mg | Freq: Once | INTRAMUSCULAR | Status: AC
  Administered 2013-05-04: 20 mg via INTRAVENOUS
  Filled 2013-05-04: qty 2

## 2013-05-04 MED ORDER — FUROSEMIDE 20 MG PO TABS: 20.0000 mg | ORAL_TABLET | Freq: Every day | ORAL | Status: DC

## 2013-05-04 NOTE — Discharge Instructions (Signed)
As discussed, it is important that you follow up as soon as possible with your physician for continued management of your condition. ° °If you develop any new, or concerning changes in your condition, please return to the emergency department immediately. ° °

## 2013-05-04 NOTE — ED Provider Notes (Signed)
CSN: [id number]     Arrival date & time 05/04/13  3016 History   First MD Initiated Contact with Patient 05/04/13 1846     Chief Complaint  Patient presents with  . Shortness of Breath  . Fall   (Consider location/radiation/quality/duration/timing/severity/associated sxs/prior Treatment) HPI Patient presents with ongoing dyspnea, pain about the right paraspinal area. Patient a mechanical fall approximately 2 weeks ago.  He notes that since that time and soreness around the right lower thoracic paraspinal area.  Pain is worse with ambulation, better at rest. No anterior chest pain, no syncope, no nausea, no vomiting. Patient is on home oxygen, has not changed his dosing. Patient notes that for a longer timeframe he has had increasing dyspnea on exertion, though no exertional chest pain. Patient has a history of COPD, CAD, is on chronic anticoagulation for atrial fibrillation.  Past Medical History  Diagnosis Date  . History of colon polyps   . Anemia   . Chronic kidney disease     History of Kidney Stones  . Ulcer     history of Peptic Ulcer Disease  . IBS (irritable bowel syndrome)   . Anxiety   . BPH (benign prostatic hypertrophy)   . CAD (coronary artery disease)   . Atrial fibrillation   . AAA (abdominal aortic aneurysm)   . Arthritis   . Chronic anticoagulation     On Coumadin therapy ( A Fib)  . Hypertension   . Hyperlipidemia   . History of DVT of lower extremity   . Cancer Sept. 2013    Melanoma,Right  posterior shoulder  . Anginal pain   . Shortness of breath   . Transient ischemic attack (TIA) 07/2012  . Fall at home Dec. 2014  . CHF (congestive heart failure)    Past Surgical History  Procedure Laterality Date  . Appendectomy    . Cholecystectomy    . Tonsillectomy    . Rotator cuff repair      Right shoulder  . Coronary artery bypass graft    . Appendectomy    . Fracture surgery  2011    Left tibial ORIF by Dr. Sharol Given  . Ankle fusion  2011    Left  ankle  . Lymph node biopsy    . Joint replacement Left Dec.  2014    Left shoulder replacement  . Kidney stones     Family History  Problem Relation Age of Onset  . Cancer Mother     stomach cancer  . Heart disease Brother   . Heart disease Brother   . Heart disease Brother    History  Substance Use Topics  . Smoking status: Former Smoker    Quit date: 04/08/1996  . Smokeless tobacco: Never Used  . Alcohol Use: No     Comment: heavy alcohol use in the past    Review of Systems  Constitutional:       Per HPI, otherwise negative  HENT:       Per HPI, otherwise negative  Respiratory:       Per HPI, otherwise negative  Cardiovascular:       Per HPI, otherwise negative  Gastrointestinal: Negative for vomiting.  Endocrine:       Negative aside from HPI  Genitourinary:       Neg aside from HPI   Musculoskeletal:       Per HPI, otherwise negative  Skin: Negative.   Neurological: Negative for syncope.    Allergies  Morphine and related  and Codeine  Home Medications   Current Outpatient Rx  Name  Route  Sig  Dispense  Refill  . acetaminophen (TYLENOL) 650 MG CR tablet   Oral   Take 1,300 mg by mouth every 8 (eight) hours as needed for pain.         Marland Kitchen albuterol (PROVENTIL HFA;VENTOLIN HFA) 108 (90 BASE) MCG/ACT inhaler   Inhalation   Inhale 2 puffs into the lungs every 6 (six) hours as needed for wheezing or shortness of breath.         Marland Kitchen aspirin EC 81 MG EC tablet   Oral   Take 1 tablet (81 mg total) by mouth daily.         Marland Kitchen atorvastatin (LIPITOR) 40 MG tablet   Oral   Take 40 mg by mouth daily.          . budesonide-formoterol (SYMBICORT) 160-4.5 MCG/ACT inhaler   Inhalation   Inhale 2 puffs into the lungs 2 (two) times daily.         . citalopram (CELEXA) 10 MG tablet   Oral   Take 10 mg by mouth daily.          . dabigatran (PRADAXA) 150 MG CAPS   Oral   Take 1 capsule (150 mg total) by mouth 2 (two) times daily.   60 capsule   3     . feeding supplement (ENSURE COMPLETE) LIQD   Oral   Take 237 mLs by mouth 2 (two) times daily between meals. Available over the counter   30 Bottle   3   . isosorbide mononitrate (IMDUR) 60 MG 24 hr tablet   Oral   Take 1 tablet (60 mg total) by mouth daily.   30 tablet   12   . meclizine (ANTIVERT) 25 MG tablet   Oral   Take 25 mg by mouth daily.         . metoprolol succinate (TOPROL-XL) 25 MG 24 hr tablet   Oral   Take 25 mg by mouth daily.          . Multiple Vitamin (MULTIVITAMIN WITH MINERALS) TABS   Oral   Take 1 tablet by mouth every evening.         . nitroGLYCERIN (NITROSTAT) 0.4 MG SL tablet   Sublingual   Place 0.4 mg under the tongue every 5 (five) minutes as needed for chest pain.         . promethazine (PHENERGAN) 25 MG tablet   Oral   Take 1 tablet (25 mg total) by mouth every 6 (six) hours as needed for nausea.   20 tablet   0   . triamterene-hydrochlorothiazide (MAXZIDE-25) 37.5-25 MG per tablet   Oral   Take 1 tablet by mouth daily.          BP 105/56  Pulse 73  Temp(Src) 97.5 F (36.4 C) (Oral)  Resp 15  Ht 5\' 9"  (1.753 m)  Wt 176 lb (79.833 kg)  BMI 25.98 kg/m2  SpO2 99% Physical Exam  Nursing note and vitals reviewed. Constitutional: He is oriented to person, place, and time. He appears well-developed. No distress.  HENT:  Head: Normocephalic and atraumatic.  Eyes: Conjunctivae and EOM are normal.  Cardiovascular: Normal rate and regular rhythm.   Pulmonary/Chest: Effort normal. No stridor. No respiratory distress.    Abdominal: He exhibits no distension.  Musculoskeletal: He exhibits no edema.  Neurological: He is alert and oriented to person, place, and time.  Skin:  Skin is warm and dry.  Psychiatric: He has a normal mood and affect.    ED Course  Procedures (including critical care time) Labs Review Labs Reviewed  CBC - Abnormal; Notable for the following:    Hemoglobin 11.4 (*)    HCT 35.7 (*)    RDW 17.0  (*)    All other components within normal limits  BASIC METABOLIC PANEL - Abnormal; Notable for the following:    GFR calc non Af Amer 60 (*)    GFR calc Af Amer 69 (*)    All other components within normal limits  PRO B NATRIURETIC PEPTIDE - Abnormal; Notable for the following:    Pro B Natriuretic peptide (BNP) 604.2 (*)    All other components within normal limits  POCT I-STAT TROPONIN I   Imaging Review Dg Chest 2 View  05/03/2013   CLINICAL DATA:  Back pain  EXAM: CHEST  2 VIEW  COMPARISON:  DG CHEST 2 VIEW dated 12/01/2012; DG CHEST 2 VIEW dated 08/05/2012; CT ANGIO CHEST W/CM &/OR WO/CM dated 03/11/2011  FINDINGS: There is elevation of the right diaphragm. There is no focal consolidation, pleural effusion or pneumothorax. Stable cardiomediastinal silhouette. Prior CABG. Numerous surgical clips in the right side of the lower neck.  There is a left shoulder arthroplasty. There is an age-indeterminate T12 vertebral body compression fracture.  IMPRESSION: 1. No active cardiopulmonary disease. 2. Elevation of the right diaphragm. 3. Age-indeterminate T12 vertebral body compression fracture, new compared with 12/01/2012.   Electronically Signed   By: Kathreen Devoid   On: 05/03/2013 12:31   Dg Thoracic Spine W/swimmers  05/03/2013   CLINICAL DATA:  Trauma status post fall.  Mid thoracic pain.  EXAM: THORACIC SPINE - 2 VIEW + SWIMMERS  COMPARISON:  DG CHEST 2 VIEW dated 05/03/2013; DG CHEST 2 VIEW dated 08/05/2012; CT ANGIO CHEST W/CM &/OR WO/CM dated 03/11/2011; DG CHEST 2 VIEW dated 12/01/2012  FINDINGS: There is mild anterior vertebral body compression fracture of the T12 vertebral body which is new compared with 12/01/2012.  The remainder the vertebral body heights are maintained. The alignment is anatomic. There is mild degenerative disc disease of the thoracic spine. There is mild degenerative disc disease of the lower cervical spine. There is no static listhesis.  There is a left shoulder arthroplasty.  There is evidence of prior CABG. There is elevation of the right diaphragm.  IMPRESSION: Age-indeterminate mild anterior vertebral body compression fracture of the T12 vertebral body which is new compared with 12/01/2012.   Electronically Signed   By: Kathreen Devoid   On: 05/03/2013 12:30   Dg Chest Port 1 View  05/04/2013   CLINICAL DATA:  Shortness of Breath  EXAM: PORTABLE CHEST - 1 VIEW  COMPARISON:  05/03/2013  FINDINGS: Stable elevation of the right diaphragmatic leaflet. Lungs are clear. Heart size upper limits normal. . No effusion. Previous CABG. Left humeral head prosthesis partially seen. Surgical clips in the right upper abdomen and above the thoracic inlet on the right.  IMPRESSION: 1. Stable chronic and postoperative changes.  No acute disease.   Electronically Signed   By: Arne Cleveland M.D.   On: 05/04/2013 19:15    EKG Interpretation    Date/Time:  Tuesday May 04 2013 18:38:46 EST Ventricular Rate:  80 PR Interval:    QRS Duration: 94 QT Interval:  400 QTC Calculation: 461 R Axis:   9 Text Interpretation:  Atrial fibrillation T wave abnormality, consider inferior ischemia Abnormal ECG  Atrial fibrillation T wave abnormality No significant change since last tracing Abnormal ekg Confirmed by Carmin Muskrat  MD [phone]) on 05/04/2013 6:47:02 PM           100 exam the patient states that he has not been taking Lasix 4 weeks  No new complaints MDM  No diagnosis found. Patient presents several weeks of her mechanical fall with ongoing dyspnea.  On exam patient awake alert and hemodynamically stable, and his labs are reassuring.  There is mild elevation in BNP consistent with patient's noncompliance with Lasix dosing the patient was discharged in stable condition to follow up with primary care and cardiology.    Carmin Muskrat, MD 05/04/13 2036

## 2013-05-04 NOTE — ED Notes (Signed)
Pt reports SOB x10 days ago, increase SOB with exertion x2 days, mid back discomfort which all of this started after a fall 10 days, states he fell out of w chair onto a concrete floor.

## 2013-06-08 ENCOUNTER — Ambulatory Visit
Admission: RE | Admit: 2013-06-08 | Discharge: 2013-06-08 | Disposition: A | Discharge status: Home / Self Care | Payer: Medicare Other | Source: Ambulatory Visit | Attending: Family Medicine | Admitting: Family Medicine

## 2013-06-08 ENCOUNTER — Other Ambulatory Visit: Payer: Self-pay | Admitting: Family Medicine

## 2013-06-08 DIAGNOSIS — R079 Chest pain, unspecified: Secondary | ICD-10-CM

## 2013-06-08 DIAGNOSIS — R29898 Other symptoms and signs involving the musculoskeletal system: Secondary | ICD-10-CM

## 2013-06-08 DIAGNOSIS — R29810 Facial weakness: Secondary | ICD-10-CM

## 2013-06-11 ENCOUNTER — Emergency Department (HOSPITAL_COMMUNITY): Payer: Medicare Other

## 2013-06-11 ENCOUNTER — Encounter (HOSPITAL_COMMUNITY): Payer: Self-pay | Admitting: Emergency Medicine

## 2013-06-11 ENCOUNTER — Inpatient Hospital Stay (HOSPITAL_COMMUNITY)
Admission: EM | Admit: 2013-06-11 | Discharge: 2013-06-18 | DRG: 054 | Disposition: A | Discharge status: Skilled Nursing Facility | Payer: Medicare Other | Attending: Internal Medicine | Admitting: Internal Medicine

## 2013-06-11 DIAGNOSIS — J4489 Other specified chronic obstructive pulmonary disease: Secondary | ICD-10-CM | POA: Diagnosis present

## 2013-06-11 DIAGNOSIS — I2510 Atherosclerotic heart disease of native coronary artery without angina pectoris: Secondary | ICD-10-CM

## 2013-06-11 DIAGNOSIS — E785 Hyperlipidemia, unspecified: Secondary | ICD-10-CM

## 2013-06-11 DIAGNOSIS — C7931 Secondary malignant neoplasm of brain: Principal | ICD-10-CM | POA: Diagnosis present

## 2013-06-11 DIAGNOSIS — C50919 Malignant neoplasm of unspecified site of unspecified female breast: Secondary | ICD-10-CM | POA: Diagnosis present

## 2013-06-11 DIAGNOSIS — I509 Heart failure, unspecified: Secondary | ICD-10-CM | POA: Diagnosis present

## 2013-06-11 DIAGNOSIS — E876 Hypokalemia: Secondary | ICD-10-CM

## 2013-06-11 DIAGNOSIS — Z8673 Personal history of transient ischemic attack (TIA), and cerebral infarction without residual deficits: Secondary | ICD-10-CM

## 2013-06-11 DIAGNOSIS — Z7901 Long term (current) use of anticoagulants: Secondary | ICD-10-CM

## 2013-06-11 DIAGNOSIS — C779 Secondary and unspecified malignant neoplasm of lymph node, unspecified: Secondary | ICD-10-CM

## 2013-06-11 DIAGNOSIS — Z96619 Presence of unspecified artificial shoulder joint: Secondary | ICD-10-CM

## 2013-06-11 DIAGNOSIS — I639 Cerebral infarction, unspecified: Secondary | ICD-10-CM

## 2013-06-11 DIAGNOSIS — I129 Hypertensive chronic kidney disease with stage 1 through stage 4 chronic kidney disease, or unspecified chronic kidney disease: Secondary | ICD-10-CM | POA: Diagnosis present

## 2013-06-11 DIAGNOSIS — N189 Chronic kidney disease, unspecified: Secondary | ICD-10-CM | POA: Diagnosis present

## 2013-06-11 DIAGNOSIS — G936 Cerebral edema: Secondary | ICD-10-CM | POA: Diagnosis present

## 2013-06-11 DIAGNOSIS — Z79899 Other long term (current) drug therapy: Secondary | ICD-10-CM

## 2013-06-11 DIAGNOSIS — Z85820 Personal history of malignant melanoma of skin: Secondary | ICD-10-CM

## 2013-06-11 DIAGNOSIS — Z86718 Personal history of other venous thrombosis and embolism: Secondary | ICD-10-CM

## 2013-06-11 DIAGNOSIS — D649 Anemia, unspecified: Secondary | ICD-10-CM | POA: Diagnosis present

## 2013-06-11 DIAGNOSIS — I714 Abdominal aortic aneurysm, without rupture, unspecified: Secondary | ICD-10-CM | POA: Diagnosis present

## 2013-06-11 DIAGNOSIS — G8190 Hemiplegia, unspecified affecting unspecified side: Secondary | ICD-10-CM | POA: Diagnosis present

## 2013-06-11 DIAGNOSIS — I4891 Unspecified atrial fibrillation: Secondary | ICD-10-CM

## 2013-06-11 DIAGNOSIS — Z7982 Long term (current) use of aspirin: Secondary | ICD-10-CM

## 2013-06-11 DIAGNOSIS — C439 Malignant melanoma of skin, unspecified: Secondary | ICD-10-CM

## 2013-06-11 DIAGNOSIS — I10 Essential (primary) hypertension: Secondary | ICD-10-CM

## 2013-06-11 DIAGNOSIS — J449 Chronic obstructive pulmonary disease, unspecified: Secondary | ICD-10-CM | POA: Diagnosis present

## 2013-06-11 DIAGNOSIS — G9389 Other specified disorders of brain: Secondary | ICD-10-CM

## 2013-06-11 DIAGNOSIS — I6350 Cerebral infarction due to unspecified occlusion or stenosis of unspecified cerebral artery: Secondary | ICD-10-CM

## 2013-06-11 DIAGNOSIS — C799 Secondary malignant neoplasm of unspecified site: Secondary | ICD-10-CM | POA: Diagnosis present

## 2013-06-11 DIAGNOSIS — Z951 Presence of aortocoronary bypass graft: Secondary | ICD-10-CM

## 2013-06-11 DIAGNOSIS — C7952 Secondary malignant neoplasm of bone marrow: Secondary | ICD-10-CM

## 2013-06-11 DIAGNOSIS — C7951 Secondary malignant neoplasm of bone: Secondary | ICD-10-CM | POA: Diagnosis present

## 2013-06-11 DIAGNOSIS — C7949 Secondary malignant neoplasm of other parts of nervous system: Principal | ICD-10-CM

## 2013-06-11 DIAGNOSIS — C717 Malignant neoplasm of brain stem: Secondary | ICD-10-CM

## 2013-06-11 HISTORY — DX: Chronic obstructive pulmonary disease, unspecified: J44.9

## 2013-06-11 LAB — RAPID URINE DRUG SCREEN, HOSP PERFORMED
AMPHETAMINES: NOT DETECTED
BENZODIAZEPINES: NOT DETECTED
Barbiturates: NOT DETECTED
COCAINE: NOT DETECTED
OPIATES: NOT DETECTED
Tetrahydrocannabinol: NOT DETECTED

## 2013-06-11 LAB — CBC WITH DIFFERENTIAL/PLATELET
Basophils Absolute: 0 10*3/uL (ref 0.0–0.1)
Basophils Relative: 0 % (ref 0–1)
EOS ABS: 0.2 10*3/uL (ref 0.0–0.7)
Eosinophils Relative: 3 % (ref 0–5)
HCT: 33.6 % — ABNORMAL LOW (ref 39.0–52.0)
Hemoglobin: 11 g/dL — ABNORMAL LOW (ref 13.0–17.0)
LYMPHS ABS: 1 10*3/uL (ref 0.7–4.0)
Lymphocytes Relative: 12 % (ref 12–46)
MCH: 26.6 pg (ref 26.0–34.0)
MCHC: 32.7 g/dL (ref 30.0–36.0)
MCV: 81.4 fL (ref 78.0–100.0)
MONOS PCT: 11 % (ref 3–12)
Monocytes Absolute: 0.9 10*3/uL (ref 0.1–1.0)
NEUTROS ABS: 6.1 10*3/uL (ref 1.7–7.7)
NEUTROS PCT: 74 % (ref 43–77)
PLATELETS: 202 10*3/uL (ref 150–400)
RBC: 4.13 MIL/uL — AB (ref 4.22–5.81)
RDW: 16.2 % — ABNORMAL HIGH (ref 11.5–15.5)
WBC: 8.2 10*3/uL (ref 4.0–10.5)

## 2013-06-11 LAB — COMPREHENSIVE METABOLIC PANEL
ALT: 10 U/L (ref 0–53)
AST: 16 U/L (ref 0–37)
Albumin: 3.4 g/dL — ABNORMAL LOW (ref 3.5–5.2)
Alkaline Phosphatase: 64 U/L (ref 39–117)
BUN: 20 mg/dL (ref 6–23)
CO2: 28 meq/L (ref 19–32)
CREATININE: 1.2 mg/dL (ref 0.50–1.35)
Calcium: 9.9 mg/dL (ref 8.4–10.5)
Chloride: 100 mEq/L (ref 96–112)
GFR, EST AFRICAN AMERICAN: 61 mL/min — AB (ref >90)
GFR, EST NON AFRICAN AMERICAN: 53 mL/min — AB (ref >90)
GLUCOSE: 102 mg/dL — AB (ref 70–99)
Potassium: 3.5 mEq/L — ABNORMAL LOW (ref 3.7–5.3)
SODIUM: 141 meq/L (ref 137–147)
TOTAL PROTEIN: 6.1 g/dL (ref 6.0–8.3)
Total Bilirubin: 0.4 mg/dL (ref 0.3–1.2)

## 2013-06-11 LAB — CBG MONITORING, ED: Glucose-Capillary: 85 mg/dL (ref 70–99)

## 2013-06-11 LAB — URINALYSIS, ROUTINE W REFLEX MICROSCOPIC
BILIRUBIN URINE: NEGATIVE
Glucose, UA: NEGATIVE mg/dL
HGB URINE DIPSTICK: NEGATIVE
KETONES UR: NEGATIVE mg/dL
Leukocytes, UA: NEGATIVE
Nitrite: NEGATIVE
PH: 6 (ref 5.0–8.0)
PROTEIN: NEGATIVE mg/dL
Specific Gravity, Urine: 1.023 (ref 1.005–1.030)
Urobilinogen, UA: 0.2 mg/dL (ref 0.0–1.0)

## 2013-06-11 LAB — I-STAT CHEM 8, ED
BUN: 21 mg/dL (ref 6–23)
CHLORIDE: 96 meq/L (ref 96–112)
Calcium, Ion: 1.28 mmol/L (ref 1.13–1.30)
Creatinine, Ser: 1.5 mg/dL — ABNORMAL HIGH (ref 0.50–1.35)
GLUCOSE: 102 mg/dL — AB (ref 70–99)
HCT: 36 % — ABNORMAL LOW (ref 39.0–52.0)
HEMOGLOBIN: 12.2 g/dL — AB (ref 13.0–17.0)
POTASSIUM: 3.3 meq/L — AB (ref 3.7–5.3)
SODIUM: 140 meq/L (ref 137–147)
TCO2: 30 mmol/L (ref 0–100)

## 2013-06-11 LAB — PROTIME-INR
INR: 1.26 (ref 0.00–1.49)
PROTHROMBIN TIME: 15.5 s — AB (ref 11.6–15.2)

## 2013-06-11 LAB — I-STAT TROPONIN, ED: TROPONIN I, POC: 0 ng/mL (ref 0.00–0.08)

## 2013-06-11 LAB — APTT: aPTT: 46 seconds — ABNORMAL HIGH (ref 24–37)

## 2013-06-11 LAB — ETHANOL: Alcohol, Ethyl (B): <11 mg/dL (ref 0–11)

## 2013-06-11 MED ORDER — ISOSORBIDE MONONITRATE ER 60 MG PO TB24
60.0000 mg | ORAL_TABLET | Freq: Every day | ORAL | Status: DC
  Administered 2013-06-12 – 2013-06-16 (×5): 60 mg via ORAL
  Filled 2013-06-11 (×7): qty 1

## 2013-06-11 MED ORDER — CITALOPRAM HYDROBROMIDE 10 MG PO TABS
10.0000 mg | ORAL_TABLET | Freq: Every day | ORAL | Status: DC
  Administered 2013-06-12 – 2013-06-17 (×6): 10 mg via ORAL
  Filled 2013-06-11 (×8): qty 1

## 2013-06-11 MED ORDER — FUROSEMIDE 20 MG PO TABS
20.0000 mg | ORAL_TABLET | ORAL | Status: DC | PRN
  Filled 2013-06-11: qty 1

## 2013-06-11 MED ORDER — METOPROLOL SUCCINATE ER 25 MG PO TB24
25.0000 mg | ORAL_TABLET | Freq: Every day | ORAL | Status: DC
  Administered 2013-06-12: 25 mg via ORAL
  Filled 2013-06-11 (×3): qty 1

## 2013-06-11 MED ORDER — DABIGATRAN ETEXILATE MESYLATE 150 MG PO CAPS
150.0000 mg | ORAL_CAPSULE | Freq: Two times a day (BID) | ORAL | Status: DC
  Administered 2013-06-11 – 2013-06-16 (×10): 150 mg via ORAL
  Filled 2013-06-11 (×12): qty 1

## 2013-06-11 MED ORDER — SENNOSIDES-DOCUSATE SODIUM 8.6-50 MG PO TABS
1.0000 | ORAL_TABLET | Freq: Every evening | ORAL | Status: DC | PRN
Start: 2013-06-11 — End: 2013-06-18

## 2013-06-11 MED ORDER — ACETAMINOPHEN 325 MG PO TABS: 650.0000 mg | ORAL_TABLET | ORAL | Status: DC | PRN

## 2013-06-11 MED ORDER — ALBUTEROL SULFATE HFA 108 (90 BASE) MCG/ACT IN AERS: 2.0000 | INHALATION_SPRAY | Freq: Four times a day (QID) | RESPIRATORY_TRACT | Status: DC | PRN

## 2013-06-11 MED ORDER — ALBUTEROL SULFATE (2.5 MG/3ML) 0.083% IN NEBU: 2.5000 mg | INHALATION_SOLUTION | Freq: Four times a day (QID) | RESPIRATORY_TRACT | Status: DC | PRN

## 2013-06-11 MED ORDER — BUDESONIDE-FORMOTEROL FUMARATE 160-4.5 MCG/ACT IN AERO
2.0000 | INHALATION_SPRAY | Freq: Two times a day (BID) | RESPIRATORY_TRACT | Status: DC
  Administered 2013-06-11 – 2013-06-18 (×14): 2 via RESPIRATORY_TRACT
  Filled 2013-06-11: qty 6

## 2013-06-11 MED ORDER — ASPIRIN EC 81 MG PO TBEC
81.0000 mg | DELAYED_RELEASE_TABLET | Freq: Every day | ORAL | Status: DC
  Administered 2013-06-12 – 2013-06-16 (×5): 81 mg via ORAL
  Filled 2013-06-11 (×6): qty 1

## 2013-06-11 MED ORDER — SODIUM CHLORIDE 0.9 % IV SOLN
INTRAVENOUS | Status: DC
  Administered 2013-06-11: 1000 mL via INTRAVENOUS
  Administered 2013-06-12: 21:00:00 via INTRAVENOUS

## 2013-06-11 MED ORDER — PROMETHAZINE HCL 25 MG PO TABS: 25.0000 mg | ORAL_TABLET | Freq: Four times a day (QID) | ORAL | Status: DC | PRN

## 2013-06-11 MED ORDER — MECLIZINE HCL 25 MG PO TABS
25.0000 mg | ORAL_TABLET | Freq: Every day | ORAL | Status: DC
  Administered 2013-06-12 – 2013-06-17 (×6): 25 mg via ORAL
  Filled 2013-06-11 (×8): qty 1

## 2013-06-11 MED ORDER — ATORVASTATIN CALCIUM 40 MG PO TABS
40.0000 mg | ORAL_TABLET | Freq: Every day | ORAL | Status: DC
  Administered 2013-06-12 – 2013-06-17 (×6): 40 mg via ORAL
  Filled 2013-06-11 (×8): qty 1

## 2013-06-11 MED ORDER — NITROGLYCERIN 0.4 MG SL SUBL: 0.4000 mg | SUBLINGUAL_TABLET | SUBLINGUAL | Status: DC | PRN

## 2013-06-11 MED ORDER — ACETAMINOPHEN 650 MG RE SUPP: 650.0000 mg | RECTAL | Status: DC | PRN

## 2013-06-11 MED ORDER — ADULT MULTIVITAMIN W/MINERALS CH
1.0000 | ORAL_TABLET | Freq: Every evening | ORAL | Status: DC
  Administered 2013-06-12 – 2013-06-17 (×6): 1 via ORAL
  Filled 2013-06-11 (×8): qty 1

## 2013-06-11 MED ORDER — POTASSIUM CHLORIDE 10 MEQ/100ML IV SOLN
10.0000 meq | INTRAVENOUS | Status: AC
  Administered 2013-06-11 (×3): 10 meq via INTRAVENOUS
  Filled 2013-06-11 (×3): qty 100

## 2013-06-11 NOTE — ED Notes (Signed)
Patient transported to CT. Pt transported to XR

## 2013-06-11 NOTE — Consult Note (Signed)
Referring Physician: Dr. Maryan Rued    Chief Complaint: Progressive weakness involving right extremities as well as vertigo.  HPI: Gabriel Wood is an 80 y.o. male with a history of atrial fibrillation, hypertension, hyperlipidemia, TIA, congestive heart failure and COPD, presenting with progressive weakness involving right extremities over the past couple of weeks. Patient had a CT scan of his head 3 days ago which showed findings consistent with acute stroke involving left cerebral peduncle and pons. He has gotten progressively weaker to the point that he was unable to walk without assistance this morning and was unable to use his right upper extremity at all. Repeat CT scan today showed no significant change from 06/08/2013. NIH stroke score was 7.  LSN: Unclear tPA Given: No: Unclear onset of acute stroke.  MRankin: 3  Past Medical History  Diagnosis Date  . History of colon polyps   . Anemia   . Chronic kidney disease     History of Kidney Stones  . Ulcer     history of Peptic Ulcer Disease  . IBS (irritable bowel syndrome)   . Anxiety   . BPH (benign prostatic hypertrophy)   . CAD (coronary artery disease)   . Atrial fibrillation   . AAA (abdominal aortic aneurysm)   . Arthritis   . Chronic anticoagulation     On Coumadin therapy ( A Fib)  . Hypertension   . Hyperlipidemia   . History of DVT of lower extremity   . Cancer Sept. 2013    Melanoma,Right  posterior shoulder  . Anginal pain   . Shortness of breath   . Transient ischemic attack (TIA) 07/2012  . Fall at home Dec. 2014  . CHF (congestive heart failure)   . COPD (chronic obstructive pulmonary disease)     Family History  Problem Relation Age of Onset  . Cancer Mother     stomach cancer  . Heart disease Brother   . Heart disease Brother   . Heart disease Brother      Medications: I have reviewed the patient's current medications.  ROS: History obtained from child and the patient  General ROS:  negative for - chills, fatigue, fever, night sweats, weight gain or weight loss Psychological ROS: negative for - behavioral disorder, hallucinations, memory difficulties, mood swings or suicidal ideation Ophthalmic ROS: negative for - blurry vision, double vision, eye pain or loss of vision ENT ROS: negative for - epistaxis, nasal discharge, oral lesions, sore throat, tinnitus or vertigo Allergy and Immunology ROS: negative for - hives or itchy/watery eyes Hematological and Lymphatic ROS: negative for - bleeding problems, bruising or swollen lymph nodes Endocrine ROS: negative for - galactorrhea, hair pattern changes, polydipsia/polyuria or temperature intolerance Respiratory ROS: negative for - cough, hemoptysis, shortness of breath or wheezing Cardiovascular ROS: negative for - chest pain, dyspnea on exertion, edema or irregular heartbeat Gastrointestinal ROS: negative for - abdominal pain, diarrhea, hematemesis, nausea/vomiting or stool incontinence Genito-Urinary ROS: negative for - dysuria, hematuria, incontinence or urinary frequency/urgency Musculoskeletal ROS: negative for - joint swelling or muscular weakness Neurological ROS: as noted in HPI Dermatological ROS: negative for rash and skin lesion changes  Physical Examination: Blood pressure 106/55, pulse 64, temperature 97.8 F (36.6 C), temperature source Oral, resp. rate 15, SpO2 99.00%.  Neurologic Examination: Mental Status: Alert, oriented, thought content appropriate.  Speech slightly slurred without evidence of aphasia. Able to follow commands without difficulty. Cranial Nerves: II-Visual fields were normal. III/IV/VI-Pupils were equal and reacted. Extraocular movements were full and  conjugate.    V/VII-no facial numbness; mild right lower facial weakness. VIII-normal. X-slightly slurred speech; symmetrical palatal movement.. Motor: Moderately severe weakness proximally and moderate weakness distally involving right upper  extremity; moderate proximal weakness of right lower extremity; normal strength of left extremities; normal muscle tone throughout. Sensory: Normal throughout. Deep Tendon Reflexes: Asymmetric with slightly greater responses elicited from right extremities compared to left extremities. Plantars: Mute bilaterally Cerebellar: Normal finger-to-nose testing with use of right upper extremity. Carotid auscultation: Normal  Dg Chest 2 View  06/11/2013   CLINICAL DATA:  Cough and shortness of breath.  EXAM: CHEST  2 VIEW  COMPARISON:  06/08/2013 and 05/03/2013  FINDINGS: Chronic elevation of the right hemidiaphragm. Again noted are postsurgical changes in the left shoulder consistent with a shoulder arthroplasty. Patient also has median sternotomy wires. Chronic linear densities in the right lung and anterior chest on the lateral view. No evidence for acute airspace disease. There is volume loss at the left lung base. Heart size is stable. Again noted is a compression fracture in the lower thoracic spine.  IMPRESSION: Chronic lung changes with left basilar atelectasis. Minimal change from the previous examination.   Electronically Signed   By: Markus Daft M.D.   On: 06/11/2013 12:12   Ct Head Wo Contrast  06/11/2013   CLINICAL DATA:  Right-sided weakness.  History of prior infarct.  EXAM: CT HEAD WITHOUT CONTRAST  TECHNIQUE: Contiguous axial images were obtained from the base of the skull through the vertex without intravenous contrast.  COMPARISON:  Brain MRI 07/30/2012.  Head CT scan 06/08/2013.  FINDINGS: Atrophy and chronic microvascular ischemic change are again seen. The left cerebral peduncle is hypoattenuating and swollen relative to the right consistent with subacute infarct. More inferiorly, there is hypoattenuation in the pons. These findings are unchanged since the most recent prior exam. The brain is atrophic with chronic microvascular ischemic change. No hemorrhage, midline shift or abnormal  extra-axial fluid collection is identified. There is no hydrocephalus or pneumocephalus. The calvarium is intact.  IMPRESSION: Findings consistent with subacute infarct in the left cerebral peduncle and pons. The appearance is not markedly changed since the most recent head CT. Negative for hemorrhage.   Electronically Signed   By: Inge Rise M.D.   On: 06/11/2013 12:50    Assessment: 80 y.o. male  with multiple risk factors for stroke including atrial fibrillation and arm Pradaxa and aspirin daily, presenting with subacute left cerebral peduncle and pontine infarction, with possible acute extension of existing stroke. Acute left cerebral infarction cannot be ruled out, as well.   Stroke Risk Factors - atrial fibrillation, hyperlipidemia and hypertension  Plan: 1. HgbA1c, fasting lipid panel 2. MRI, MRA  of the brain without contrast 3. PT consult, OT consult, Speech consult 4. Echocardiogram 5. Carotid dopplers 6. Prophylactic therapy-Pradaxa and aspirin 7. Risk factor modification 8. Telemetry monitoring   C.R. Nicole Kindred, MD [facility] [phone]   06/11/2013, 4:05 PM

## 2013-06-11 NOTE — ED Notes (Signed)
Family at bedside. 

## 2013-06-11 NOTE — ED Notes (Addendum)
Pt states he woke this am with slurred speech and R sided facial droop. His daugther states that hes had several episodes like this recently and his PCP diagnosed him with "mini strokes." denies pain. His speech is slurred but comprehensible now. He states his R leg feels weak also. He had a CT scan on Tuesday.

## 2013-06-11 NOTE — ED Notes (Signed)
Patient transported to X-ray 

## 2013-06-11 NOTE — ED Notes (Signed)
Paged Dr. Nicole Kindred to [phone]

## 2013-06-11 NOTE — H&P (Addendum)
[facility] History and Physical  Gabriel Wood KVQ:[id number] DOB: 1924-01-25 DOA: 06/11/2013  Referring physician: EDP PCP: Gabriel Huh, MD   Chief Complaint: Right-sided weakness  HPI: Gabriel Wood is a 80 y.o. male with past medical history significant for atrial fibrillation on chronic anticoagulation (aspirin and pradaxa), CAD, CHF, hypertension, TIA in the past and other medical problems as listed below who presents with above complaints. He states that about 2-3 weeks ago he first began having right-sided weakness which has progressively worsened to raise his unable to ambulate without assistance. He also states he's had some right facial weakness, slurred speech and drooling.He subsequently saw his PCP 3 days ago and a CT scan of his brain was ordered and it showed concern for left mid brain infarction. In the ED today CT showed findings consistent with subacute infarct in the left cerebral peduncle and pons. The patient denies visual changes, dysphagia, chest pain shortness of breath melena and no hematochezia. Neuro was consulted and patient is admitted for further evaluation and management.   Review of Systems The patient denies anorexia, fever, weight loss,, vision loss, decreased hearing, hoarseness, chest pain, syncope, dyspnea on exertion, peripheral edema, hemoptysis, abdominal pain, melena, hematochezia, severe indigestion/heartburn, hematuria, incontinence, genital sores, suspicious skin lesions, transient blindness, depression, unusual weight change, abnormal bleeding, enlarged lymph nodes, angioedema, and breast masses.   Past Medical History  Diagnosis Date  . History of colon polyps   . Anemia   . Chronic kidney disease     History of Kidney Stones  . Ulcer     history of Peptic Ulcer Disease  . IBS (irritable bowel syndrome)   . Anxiety   . BPH (benign prostatic hypertrophy)   . CAD (coronary artery disease)   . Atrial fibrillation   . AAA  (abdominal aortic aneurysm)   . Arthritis   . Chronic anticoagulation     On Coumadin therapy ( A Fib)  . Hypertension   . Hyperlipidemia   . History of DVT of lower extremity   . Cancer Sept. 2013    Melanoma,Right  posterior shoulder  . Anginal pain   . Shortness of breath   . Transient ischemic attack (TIA) 07/2012  . Fall at home Dec. 2014  . CHF (congestive heart failure)   . COPD (chronic obstructive pulmonary disease)    Past Surgical History  Procedure Laterality Date  . Appendectomy    . Cholecystectomy    . Tonsillectomy    . Rotator cuff repair      Right shoulder  . Coronary artery bypass graft    . Appendectomy    . Fracture surgery  2011    Left tibial ORIF by Dr. Sharol Given  . Ankle fusion  2011    Left ankle  . Lymph node biopsy    . Joint replacement Left Dec.  2014    Left shoulder replacement  . Kidney stones     Social History:  reports that he quit smoking about 17 years ago. He has never used smokeless tobacco. He reports that he does not drink alcohol or use illicit drugs.  Allergies  Allergen Reactions  . Morphine And Related Palpitations  . Codeine Nausea Only    Family History  Problem Relation Age of Onset  . Cancer Mother     stomach cancer  . Heart disease Brother   . Heart disease Brother   . Heart disease Brother      Prior to Admission medications  Medication Sig Start Date End Date Taking? Authorizing Provider  acetaminophen (TYLENOL) 650 MG CR tablet Take 1,300 mg by mouth every 8 (eight) hours as needed for pain.   Yes Historical Provider, MD  albuterol (PROVENTIL HFA;VENTOLIN HFA) 108 (90 BASE) MCG/ACT inhaler Inhale 2 puffs into the lungs every 6 (six) hours as needed for wheezing or shortness of breath.   Yes Historical Provider, MD  aspirin EC 81 MG tablet Take 81 mg by mouth daily.   Yes Historical Provider, MD  atorvastatin (LIPITOR) 40 MG tablet Take 40 mg by mouth daily.  02/12/11  Yes Historical Provider, MD   budesonide-formoterol (SYMBICORT) 160-4.5 MCG/ACT inhaler Inhale 2 puffs into the lungs 2 (two) times daily.   Yes Historical Provider, MD  citalopram (CELEXA) 10 MG tablet Take 10 mg by mouth daily.  03/07/11  Yes Historical Provider, MD  dabigatran (PRADAXA) 150 MG CAPS capsule Take 150 mg by mouth 2 (two) times daily.   Yes Historical Provider, MD  furosemide (LASIX) 20 MG tablet Take 20 mg by mouth as needed for fluid.   Yes Historical Provider, MD  isosorbide mononitrate (IMDUR) 60 MG 24 hr tablet Take 60 mg by mouth daily.   Yes Historical Provider, MD  meclizine (ANTIVERT) 25 MG tablet Take 25 mg by mouth daily.    Yes Historical Provider, MD  metoprolol succinate (TOPROL-XL) 25 MG 24 hr tablet Take 25 mg by mouth daily.  03/07/11  Yes Historical Provider, MD  Multiple Vitamin (MULTIVITAMIN WITH MINERALS) TABS Take 1 tablet by mouth every evening.   Yes Historical Provider, MD  nitroGLYCERIN (NITROSTAT) 0.4 MG SL tablet Place 0.4 mg under the tongue every 5 (five) minutes as needed for chest pain.   Yes Historical Provider, MD  promethazine (PHENERGAN) 25 MG tablet Take 1 tablet (25 mg total) by mouth every 6 (six) hours as needed for nausea. 07/30/12  Yes Cletus Gash, MD  feeding supplement (ENSURE COMPLETE) LIQD Take 237 mLs by mouth 2 (two) times daily between meals. Available over the counter 07/31/12   Ripudeep Krystal Eaton, MD   Physical Exam: Filed Vitals:   06/11/13 1721  BP: 132/77  Pulse: 79  Temp: 97.2 F (36.2 C)  Resp: 20    BP 132/77  Pulse 79  Temp(Src) 97.2 F (36.2 C) (Oral)  Resp 20  SpO2 100% Constitutional: Vital signs reviewed.  Patient is a well-developed and well-nourished  in no acute distress and cooperative with exam. Alert and oriented x3.  Head: Normocephalic and atraumatic with right facial asymmetry present Mouth: no erythema or exudates, MMM Eyes: PERRL, EOMI, conjunctivae normal, No scleral icterus.  Neck: Supple, Trachea midline normal ROM, No  JVD, mass, thyromegaly, or carotid bruit present.  Cardiovascular: RRR, S1 normal, S2 normal, no MRG, pulses symmetric and intact bilaterally Pulmonary/Chest: normal respiratory effort, CTAB, no wheezes, rales, or rhonchi Abdominal: Soft. Non-tender, non-distended, bowel sounds are normal, no masses, organomegaly, or guarding present.  Extremities: No cyanosis and no edema  Hematology: no cervical, inginal, or axillary adenopathy.  Neurological: A&O x3, right facial weakness cranial nerve II-XII are grossly intact, normal left-sided strength, right upper extremity 3+/5, right lower extremity 4/5 Skin: Warm, dry and intact. No rash, cyanosis, or clubbing.  Psychiatric: Normal mood and affect. speech and behavior is normal.               Labs on Admission:  Basic Metabolic Panel:  Recent Labs Lab 06/11/13 1137 06/11/13 1150  NA 141 140  K  3.5* 3.3*  CL 100 96  CO2 28  --   GLUCOSE 102* 102*  BUN 20 21  CREATININE 1.20 1.50*  CALCIUM 9.9  --    Liver Function Tests:  Recent Labs Lab 06/11/13 1137  AST 16  ALT 10  ALKPHOS 64  BILITOT 0.4  PROT 6.1  ALBUMIN 3.4*   No results found for this basename: LIPASE, AMYLASE,  in the last 168 hours No results found for this basename: AMMONIA,  in the last 168 hours CBC:  Recent Labs Lab 06/11/13 1137 06/11/13 1150  WBC 8.2  --   NEUTROABS 6.1  --   HGB 11.0* 12.2*  HCT 33.6* 36.0*  MCV 81.4  --   PLT 202  --    Cardiac Enzymes: No results found for this basename: CKTOTAL, CKMB, CKMBINDEX, TROPONINI,  in the last 168 hours  BNP (last 3 results)  Recent Labs  05/04/13 1930  PROBNP 604.2*   CBG:  Recent Labs Lab 06/11/13 1217  GLUCAP 85    Radiological Exams on Admission: Dg Chest 2 View  06/11/2013   CLINICAL DATA:  Cough and shortness of breath.  EXAM: CHEST  2 VIEW  COMPARISON:  06/08/2013 and 05/03/2013  FINDINGS: Chronic elevation of the right hemidiaphragm. Again noted are postsurgical changes in the  left shoulder consistent with a shoulder arthroplasty. Patient also has median sternotomy wires. Chronic linear densities in the right lung and anterior chest on the lateral view. No evidence for acute airspace disease. There is volume loss at the left lung base. Heart size is stable. Again noted is a compression fracture in the lower thoracic spine.  IMPRESSION: Chronic lung changes with left basilar atelectasis. Minimal change from the previous examination.   Electronically Signed   By: Markus Daft M.D.   On: 06/11/2013 12:12   Ct Head Wo Contrast  06/11/2013   CLINICAL DATA:  Right-sided weakness.  History of prior infarct.  EXAM: CT HEAD WITHOUT CONTRAST  TECHNIQUE: Contiguous axial images were obtained from the base of the skull through the vertex without intravenous contrast.  COMPARISON:  Brain MRI 07/30/2012.  Head CT scan 06/08/2013.  FINDINGS: Atrophy and chronic microvascular ischemic change are again seen. The left cerebral peduncle is hypoattenuating and swollen relative to the right consistent with subacute infarct. More inferiorly, there is hypoattenuation in the pons. These findings are unchanged since the most recent prior exam. The brain is atrophic with chronic microvascular ischemic change. No hemorrhage, midline shift or abnormal extra-axial fluid collection is identified. There is no hydrocephalus or pneumocephalus. The calvarium is intact.  IMPRESSION: Findings consistent with subacute infarct in the left cerebral peduncle and pons. The appearance is not markedly changed since the most recent head CT. Negative for hemorrhage.   Electronically Signed   By: Inge Rise M.D.   On: 06/11/2013 12:50    EKG: Independently reviewed.  Assessment/Plan Active Problems:   CVA (cerebral infarction) -As discussed above, patient presenting with subacute CVA on aspirin and the ducts at -Will admit for CVA workup-MRI/MRA, carotid Dopplers, echo,fasting lipid profile A1c and follow -PT OT SLP  consults -Will continue Pradaxa and aspirin as per neuro recommendations -Appreciate neuro assistance   Atrial fibrillation -Rate controlled, Continue metoprolol -Continue Pradaxa and aspirin   CAD (coronary artery disease) -His chest pain-free, continue outpatient medications   Hypertension - Continue outpatient medications   History of DVT of lower extremity -Continue pradaxa as above Hypokalemia -Replace potassium     Code  Status: full Family Communication: none at bedside Disposition Plan:  Time spent: >68mins  [facility] Pager [phone]

## 2013-06-11 NOTE — ED Provider Notes (Addendum)
CSN: [id number]     Arrival date & time 06/11/13  1033 History   First MD Initiated Contact with Patient 06/11/13 1112     Chief Complaint  Patient presents with  . Stroke Symptoms     (Consider location/radiation/quality/duration/timing/severity/associated sxs/prior Treatment) Patient is a 80 y.o. male presenting with weakness. The history is provided by the patient and a relative.  Weakness This is a new problem. Episode onset: worsening over the last 2-3 weeks. The problem occurs constantly. The problem has been gradually worsening. Associated symptoms comments: Slurred speech, right sided upper/lower ext weakness.  Worse at the end of the day and when he is tired.  He denies any recent falls.  No choking with eating.  Denies SOB but states has been coughing up mucous.  No fever, n/v/abd pain or urinary sx.. The symptoms are aggravated by walking. Nothing relieves the symptoms. He has tried rest for the symptoms. The treatment provided no relief.    Past Medical History  Diagnosis Date  . History of colon polyps   . Anemia   . Chronic kidney disease     History of Kidney Stones  . Ulcer     history of Peptic Ulcer Disease  . IBS (irritable bowel syndrome)   . Anxiety   . BPH (benign prostatic hypertrophy)   . CAD (coronary artery disease)   . Atrial fibrillation   . AAA (abdominal aortic aneurysm)   . Arthritis   . Chronic anticoagulation     On Coumadin therapy ( A Fib)  . Hypertension   . Hyperlipidemia   . History of DVT of lower extremity   . Cancer Sept. 2013    Melanoma,Right  posterior shoulder  . Anginal pain   . Shortness of breath   . Transient ischemic attack (TIA) 07/2012  . Fall at home Dec. 2014  . CHF (congestive heart failure)   . COPD (chronic obstructive pulmonary disease)    Past Surgical History  Procedure Laterality Date  . Appendectomy    . Cholecystectomy    . Tonsillectomy    . Rotator cuff repair      Right shoulder  . Coronary artery  bypass graft    . Appendectomy    . Fracture surgery  2011    Left tibial ORIF by Dr. Sharol Given  . Ankle fusion  2011    Left ankle  . Lymph node biopsy    . Joint replacement Left Dec.  2014    Left shoulder replacement  . Kidney stones     Family History  Problem Relation Age of Onset  . Cancer Mother     stomach cancer  . Heart disease Brother   . Heart disease Brother   . Heart disease Brother    History  Substance Use Topics  . Smoking status: Former Smoker    Quit date: 04/08/1996  . Smokeless tobacco: Never Used  . Alcohol Use: No     Comment: heavy alcohol use in the past    Review of Systems  Neurological: Positive for weakness.  All other systems reviewed and are negative.      Allergies  Morphine and related and Codeine  Home Medications   Current Outpatient Rx  Name  Route  Sig  Dispense  Refill  . acetaminophen (TYLENOL) 650 MG CR tablet   Oral   Take 1,300 mg by mouth every 8 (eight) hours as needed for pain.         Marland Kitchen albuterol (  PROVENTIL HFA;VENTOLIN HFA) 108 (90 BASE) MCG/ACT inhaler   Inhalation   Inhale 2 puffs into the lungs every 6 (six) hours as needed for wheezing or shortness of breath.         Marland Kitchen aspirin EC 81 MG tablet   Oral   Take 81 mg by mouth daily.         Marland Kitchen atorvastatin (LIPITOR) 40 MG tablet   Oral   Take 40 mg by mouth daily.          . budesonide-formoterol (SYMBICORT) 160-4.5 MCG/ACT inhaler   Inhalation   Inhale 2 puffs into the lungs 2 (two) times daily.         . citalopram (CELEXA) 10 MG tablet   Oral   Take 10 mg by mouth daily.          . dabigatran (PRADAXA) 150 MG CAPS capsule   Oral   Take 150 mg by mouth 2 (two) times daily.         . furosemide (LASIX) 20 MG tablet   Oral   Take 20 mg by mouth as needed for fluid.         . isosorbide mononitrate (IMDUR) 60 MG 24 hr tablet   Oral   Take 60 mg by mouth daily.         . meclizine (ANTIVERT) 25 MG tablet   Oral   Take 25 mg by  mouth daily.          . metoprolol succinate (TOPROL-XL) 25 MG 24 hr tablet   Oral   Take 25 mg by mouth daily.          . Multiple Vitamin (MULTIVITAMIN WITH MINERALS) TABS   Oral   Take 1 tablet by mouth every evening.         . nitroGLYCERIN (NITROSTAT) 0.4 MG SL tablet   Sublingual   Place 0.4 mg under the tongue every 5 (five) minutes as needed for chest pain.         . promethazine (PHENERGAN) 25 MG tablet   Oral   Take 1 tablet (25 mg total) by mouth every 6 (six) hours as needed for nausea.   20 tablet   0   . feeding supplement (ENSURE COMPLETE) LIQD   Oral   Take 237 mLs by mouth 2 (two) times daily between meals. Available over the counter   30 Bottle   3    BP 106/73  Pulse 93  Temp(Src) 97.8 F (36.6 C) (Oral)  Resp 91  SpO2 94% Physical Exam  Nursing note and vitals reviewed. Constitutional: He is oriented to person, place, and time. He appears well-developed and well-nourished. No distress.  HENT:  Head: Normocephalic and atraumatic.  Mouth/Throat: Oropharynx is clear and moist.  Eyes: EOM are normal. Pupils are equal, round, and reactive to light. Right eye exhibits discharge. Left eye exhibits discharge.  Injected bilateral conjunctiva   Neck: Normal range of motion. Neck supple.  Cardiovascular: Normal rate and intact distal pulses.  An irregularly irregular rhythm present.  Murmur heard. Pulmonary/Chest: Effort normal and breath sounds normal. No respiratory distress. He has no wheezes. He has no rales.  Abdominal: Soft. He exhibits no distension. There is no tenderness. There is no rebound and no guarding.  Musculoskeletal: Normal range of motion. He exhibits no edema and no tenderness.  Neurological: He is alert and oriented to person, place, and time.  Skin: Skin is warm and dry. No rash noted. No erythema.  Psychiatric: He has a normal mood and affect. His behavior is normal.    ED Course  Procedures (including critical care  time) Labs Review Labs Reviewed  CBC WITH DIFFERENTIAL - Abnormal; Notable for the following:    RBC 4.13 (*)    Hemoglobin 11.0 (*)    HCT 33.6 (*)    RDW 16.2 (*)    All other components within normal limits  PROTIME-INR - Abnormal; Notable for the following:    Prothrombin Time 15.5 (*)    All other components within normal limits  APTT - Abnormal; Notable for the following:    aPTT 46 (*)    All other components within normal limits  COMPREHENSIVE METABOLIC PANEL - Abnormal; Notable for the following:    Potassium 3.5 (*)    Glucose, Bld 102 (*)    Albumin 3.4 (*)    GFR calc non Af Amer 53 (*)    GFR calc Af Amer 61 (*)    All other components within normal limits  I-STAT CHEM 8, ED - Abnormal; Notable for the following:    Potassium 3.3 (*)    Creatinine, Ser 1.50 (*)    Glucose, Bld 102 (*)    Hemoglobin 12.2 (*)    HCT 36.0 (*)    All other components within normal limits  ETHANOL  URINE RAPID DRUG SCREEN (HOSP PERFORMED)  URINALYSIS, ROUTINE W REFLEX MICROSCOPIC  CBG MONITORING, ED  Randolm Idol, ED   Imaging Review Dg Chest 2 View  06/11/2013   CLINICAL DATA:  Cough and shortness of breath.  EXAM: CHEST  2 VIEW  COMPARISON:  06/08/2013 and 05/03/2013  FINDINGS: Chronic elevation of the right hemidiaphragm. Again noted are postsurgical changes in the left shoulder consistent with a shoulder arthroplasty. Patient also has median sternotomy wires. Chronic linear densities in the right lung and anterior chest on the lateral view. No evidence for acute airspace disease. There is volume loss at the left lung base. Heart size is stable. Again noted is a compression fracture in the lower thoracic spine.  IMPRESSION: Chronic lung changes with left basilar atelectasis. Minimal change from the previous examination.   Electronically Signed   By: Markus Daft M.D.   On: 06/11/2013 12:12   Ct Head Wo Contrast  06/11/2013   CLINICAL DATA:  Right-sided weakness.  History of prior  infarct.  EXAM: CT HEAD WITHOUT CONTRAST  TECHNIQUE: Contiguous axial images were obtained from the base of the skull through the vertex without intravenous contrast.  COMPARISON:  Brain MRI 07/30/2012.  Head CT scan 06/08/2013.  FINDINGS: Atrophy and chronic microvascular ischemic change are again seen. The left cerebral peduncle is hypoattenuating and swollen relative to the right consistent with subacute infarct. More inferiorly, there is hypoattenuation in the pons. These findings are unchanged since the most recent prior exam. The brain is atrophic with chronic microvascular ischemic change. No hemorrhage, midline shift or abnormal extra-axial fluid collection is identified. There is no hydrocephalus or pneumocephalus. The calvarium is intact.  IMPRESSION: Findings consistent with subacute infarct in the left cerebral peduncle and pons. The appearance is not markedly changed since the most recent head CT. Negative for hemorrhage.   Electronically Signed   By: Inge Rise M.D.   On: 06/11/2013 12:50     EKG Interpretation   Date/Time:  Friday June 11 2013 11:28:31 EST Ventricular Rate:  85 PR Interval:    QRS Duration: 97 QT Interval:  397 QTC Calculation: 472 R Axis:  17 Text Interpretation:  Atrial fibrillation Nonspecific T wave abnormality  No significant change since last tracing Confirmed by [facility]  MD,  Loree Fee ([id number]) on 06/11/2013 11:51:14 AM      MDM   Final diagnoses:  Stroke    Presenting with stroke like symptoms with right-sided facial droop, slurred speech and right upper/lower extremity weakness. Over the last 3 weeks patient has had progression of his symptoms and saw his PCP earlier this week who diagnosed him with a stroke. However her family states that patient continues to worsen at home and is now having difficulty eating and walking. He denies any choking with eating and denies shortness of breath, infectious symptoms, headache. Patient is tapered accident  where. He was not worked up for this stroke otherwise based on medical records. The patient needs a stroke evaluation and PT/OT.  EKG shows unchanged atrial fibrillation. CBG within normal limits. CBC, i-STAT, troponin, EtOH, CMP are within normal limits. Chest x-ray without acute change. Head CT pending to rule out secondary bleed.  1:26 PM Head CT neg for bleed with consistent subacute infarct of the left cerebral peduncle and pons.  Pt admitted for further care.  Blanchie Dessert, MD 06/11/13 [place], MD 06/11/13 1352

## 2013-06-11 NOTE — ED Notes (Signed)
Pt reports had stroke that resulted in right sided weakness a few weeks ago, states his symptoms have gotten much worse over the past 24 hours. Dr. Maryan Rued at bedside. Pt's son notes that his speech is worse upon first waking in the morning and had very poor coordination this morning while trying to walk.

## 2013-06-12 ENCOUNTER — Inpatient Hospital Stay (HOSPITAL_COMMUNITY): Payer: Medicare Other

## 2013-06-12 DIAGNOSIS — I359 Nonrheumatic aortic valve disorder, unspecified: Secondary | ICD-10-CM

## 2013-06-12 LAB — HEMOGLOBIN A1C
HEMOGLOBIN A1C: 5.4 % (ref <5.7)
Mean Plasma Glucose: 108 mg/dL (ref <117)

## 2013-06-12 LAB — LIPID PANEL
Cholesterol: 102 mg/dL (ref 0–200)
HDL: 42 mg/dL (ref >39)
LDL Cholesterol: 42 mg/dL (ref 0–99)
Total CHOL/HDL Ratio: 2.4 RATIO
Triglycerides: 90 mg/dL (ref <150)
VLDL: 18 mg/dL (ref 0–40)

## 2013-06-12 LAB — CBC
HCT: 33.2 % — ABNORMAL LOW (ref 39.0–52.0)
Hemoglobin: 10.6 g/dL — ABNORMAL LOW (ref 13.0–17.0)
MCH: 26.2 pg (ref 26.0–34.0)
MCHC: 31.9 g/dL (ref 30.0–36.0)
MCV: 82 fL (ref 78.0–100.0)
PLATELETS: 194 10*3/uL (ref 150–400)
RBC: 4.05 MIL/uL — ABNORMAL LOW (ref 4.22–5.81)
RDW: 16.3 % — ABNORMAL HIGH (ref 11.5–15.5)
WBC: 6.5 10*3/uL (ref 4.0–10.5)

## 2013-06-12 LAB — BASIC METABOLIC PANEL
BUN: 14 mg/dL (ref 6–23)
CALCIUM: 9.7 mg/dL (ref 8.4–10.5)
CO2: 27 meq/L (ref 19–32)
Chloride: 104 mEq/L (ref 96–112)
Creatinine, Ser: 0.98 mg/dL (ref 0.50–1.35)
GFR calc Af Amer: 83 mL/min — ABNORMAL LOW (ref >90)
GFR calc non Af Amer: 72 mL/min — ABNORMAL LOW (ref >90)
Glucose, Bld: 87 mg/dL (ref 70–99)
Potassium: 4.4 mEq/L (ref 3.7–5.3)
SODIUM: 142 meq/L (ref 137–147)

## 2013-06-12 MED ORDER — RESOURCE THICKENUP CLEAR PO POWD
ORAL | Status: DC | PRN
  Filled 2013-06-12: qty 125

## 2013-06-12 NOTE — Evaluation (Signed)
Occupational Therapy Evaluation Patient Details Name: Gabriel Wood MRN: [id number] DOB: [birth year] Today's Date: 06/12/2013 Time: 8416-6063 OT Time Calculation (min): 36 min  OT Assessment / Plan / Recommendation History of present illness Pt admit with left cerebral peduncle and pons infarct.     Clinical Impression   PT admitted with MRI revealing Lt pons infarct and cerebral peduncle . Pt currently with functional limitiations due to the deficits listed below (see OT problem list).  Pt will benefit from skilled OT to increase their independence and safety with adls and balance to allow discharge CIR. Physical therapy note states family requesting SNF.      OT Assessment  Patient needs continued OT Services    Follow Up Recommendations  CIR -family requesting SNF per PT note    Barriers to Discharge      Equipment Recommendations  Other (comment) (TBA)    Recommendations for Other Services Rehab consult  Frequency  Min 3X/week    Precautions / Restrictions Precautions Precautions: Fall Restrictions Weight Bearing Restrictions: No   Pertinent Vitals/Pain No pain reported at this time    ADL  Eating/Feeding: Minimal assistance (blue foam used to build up handle and pillows for positionin) Where Assessed - Eating/Feeding: Edge of bed Upper Body Bathing: Maximal assistance Where Assessed - Upper Body Bathing: Supported sitting Lower Body Bathing: Maximal assistance Where Assessed - Lower Body Bathing: Supported sitting Transfers/Ambulation Related to ADLs: pt completed supine<>sit EOB. pt with posterior lean initially. pt static sitting eob min guard (A) ADL Comments: Pt c/o visual changes recently and after questioning does report diplopia. Pt reports incr muscus discharge from Rt eye as of recent. pt states "this hand just wont do nothing right" Pt with ataxic claw hand movements to reach for cup. pt using LT UE to help improve accuracy of reach to cup. Pt provided  long straw and pitcher so pt can drink independently. Pt with cup of ice water on arrival. Ot filling cup with straw and noted to have coughing with drinking. pt states "i am sneaking this" Pt reports one of those other girls said I cough too much. SLP notified of risk for aspiration after this session . Water removed from patients reach until cleared. Pt provided blue foam for self feeding apple sauce. pt able to swallow apple sauce this session without deficits noted (no cough no watering eyes no voice tone change ). Pt return to supine. Please note visual changes below    OT Diagnosis: Generalized weakness;Hemiplegia dominant side  OT Problem List: Decreased strength;Decreased activity tolerance;Impaired balance (sitting and/or standing);Decreased safety awareness;Decreased knowledge of use of DME or AE;Decreased knowledge of precautions;Impaired UE functional use;Cardiopulmonary status limiting activity;Decreased coordination;Decreased range of motion OT Treatment Interventions: Self-care/ADL training;Therapeutic exercise;Neuromuscular education;DME and/or AE instruction;Therapeutic activities;Patient/family education;Balance training;Visual/perceptual remediation/compensation   OT Goals(Current goals can be found in the care plan section) Acute Rehab OT Goals Patient Stated Goal: to make this Rt UE work OT Goal Formulation: With patient Time For Goal Achievement: 06/26/13 Potential to Achieve Goals: Good  Visit Information  Last OT Received On: 06/12/13 Assistance Needed: +2 History of Present Illness: Pt admit with left cerebral peduncle and pons infarct.         Prior [place] expects to be discharged to:: Private residence Living Arrangements: Children Available Help at Discharge: Family;Available 24 hours/day Type of Home: House Home Access: Stairs to enter [employer] of Steps: 1 Entrance Stairs-Rails: None Home Layout: One  level Home Equipment: Cane - single point;Walker - 2 wheels;Shower seat - built in;Grab bars - tub/shower;Bedside commode Prior Function Level of Independence: Independent with assistive device(s) Comments: Has been using RW Communication Communication: No difficulties Dominant Hand: Right         Vision/Perception Vision - History Baseline Vision: No visual deficits Patient Visual Report: Diplopia;Blurring of vision Vision - Assessment Eye Alignment: Within Functional Limits Vision Assessment: Vision tested Ocular Range of Motion: Restricted on the right;Restricted looking up;Restricted looking down;Impaired-to be further tested in functional context Tracking/Visual Pursuits: Decreased smoothness of eye movement to RIGHT superior field;Decreased smoothness of eye movement to RIGHT inferior field;Requires cues, head turns, or add eye shifts to track;Unable to hold eye position out of midline;Impaired - to be further tested in functional context Additional Comments: pt noted to have dysfunction in the MLF affecting abduction of the Rt eye movement with nystagmus noted upwardly. Pt reports diplopia with reading paper at home. Pt also states "now that I think about it when I try to look to the right I see diplopia" pt demonstrating eye movements and Rt eye does not fully abduct laterally and lt eye WLF ROM. Pt noted to have nystagmus at this time.   Cognition  Cognition Arousal/Alertness: Awake/alert Behavior During Therapy: WFL for tasks assessed/performed Overall Cognitive Status: Within Functional Limits for tasks assessed    Extremity/Trunk Assessment Upper Extremity Assessment Upper Extremity Assessment: RUE deficits/detail;LUE deficits/detail RUE Deficits / Details: pt demonstrates claw hand positioning. pt with decr shoulder flexion 3- out 5 MMT Brunstrom IV. Pt with wrist stabilization is able to flex and extend digits in full ROM. pt demonstrates wrist flexion at rest and elbow  flexion.  Recommend evaluation for splint  to wrist for stabilization to incr functional use of the hand  RUE Sensation: decreased light touch RUE Coordination: decreased fine motor;decreased gross motor LUE Deficits / Details: previous shoulder surg and limited shoulder flexion ~30 decr, shoulder abduction ~20 degrees Lower Extremity Assessment Lower Extremity Assessment: Defer to PT evaluation Cervical / Trunk Assessment Cervical / Trunk Assessment: Kyphotic     Mobility Bed Mobility Overal bed mobility: Needs Assistance Bed Mobility: Supine to Sit;Sit to Supine Supine to sit: Max assist;HOB elevated Sit to supine: Max assist;HOB elevated General bed mobility comments: (A) to progress to EOB sitting. pt unable to push with RT UE. pt initiateing with head and core muscles     Exercise     Balance     End of Session OT - End of Session Activity Tolerance: Patient tolerated treatment well Patient left: in bed;with bed alarm set;with call bell/phone within reach Nurse Communication: Mobility status;Precautions  GO     Peri Maris 06/12/2013, 4:42 PM Pager: [phone]

## 2013-06-12 NOTE — Progress Notes (Signed)
PROGRESS NOTE  Gabriel Wood EQA:[id number] DOB: Jun 25, 1923 DOA: 06/11/2013 PCP: Gabriel Huh, MD  Assessment/Plan:  CVA (cerebral infarction)  -As discussed above, patient presenting with subacute CVA on aspirin and pradaxa -MRI/MRA, carotid Dopplers, echo lipid profile: LDL 42  A1c  -PT OT SLP consults  -Will continue Pradaxa and aspirin as per neuro recommendations  -neuro consult  Atrial fibrillation  -Rate controlled, Continue metoprolol  -Continue Pradaxa and aspirin   CAD (coronary artery disease)  -continue outpatient medications   Hypertension  - Continue outpatient medications   History of DVT of lower extremity  -Continue pradaxa as above   Hypokalemia  -Replace potassium      Code Status: full Family Communication:  Disposition Plan:    Consultants:  neuro  Procedures:    Antibiotics:    HPI/Subjective: Upset that he is NPO awaiting a swallow eval  Objective: Filed Vitals:   06/12/13 0610  BP: 116/61  Pulse: 74  Temp: 98 F (36.7 C)  Resp: 20    Intake/Output Summary (Last 24 hours) at 06/12/13 0848 Last data filed at 06/12/13 0600  Gross per 24 hour  Intake      0 ml  Output    975 ml  Net   -975 ml   There were no vitals filed for this visit.  Exam:   General:  A+Ox3, NAD  Cardiovascular: irr  Respiratory: clear anterior  Abdomen: +Bs, soft  Musculoskeletal: moves all 4 ext   Data Reviewed: Basic Metabolic Panel:  Recent Labs Lab 06/11/13 1137 06/11/13 1150 06/12/13 0543  NA 141 140 142  K 3.5* 3.3* 4.4  CL 100 96 104  CO2 28  --  27  GLUCOSE 102* 102* 87  BUN 20 21 14   CREATININE 1.20 1.50* 0.98  CALCIUM 9.9  --  9.7   Liver Function Tests:  Recent Labs Lab 06/11/13 1137  AST 16  ALT 10  ALKPHOS 64  BILITOT 0.4  PROT 6.1  ALBUMIN 3.4*   No results found for this basename: LIPASE, AMYLASE,  in the last 168 hours No results found for this basename: AMMONIA,  in the last 168  hours CBC:  Recent Labs Lab 06/11/13 1137 06/11/13 1150 06/12/13 0543  WBC 8.2  --  6.5  NEUTROABS 6.1  --   --   HGB 11.0* 12.2* 10.6*  HCT 33.6* 36.0* 33.2*  MCV 81.4  --  82.0  PLT 202  --  194   Cardiac Enzymes: No results found for this basename: CKTOTAL, CKMB, CKMBINDEX, TROPONINI,  in the last 168 hours BNP (last 3 results)  Recent Labs  05/04/13 1930  PROBNP 604.2*   CBG:  Recent Labs Lab 06/11/13 1217  GLUCAP 85    No results found for this or any previous visit (from the past 240 hour(s)).   Studies: Dg Chest 2 View  06/11/2013   CLINICAL DATA:  Cough and shortness of breath.  EXAM: CHEST  2 VIEW  COMPARISON:  06/08/2013 and 05/03/2013  FINDINGS: Chronic elevation of the right hemidiaphragm. Again noted are postsurgical changes in the left shoulder consistent with a shoulder arthroplasty. Patient also has median sternotomy wires. Chronic linear densities in the right lung and anterior chest on the lateral view. No evidence for acute airspace disease. There is volume loss at the left lung base. Heart size is stable. Again noted is a compression fracture in the lower thoracic spine.  IMPRESSION: Chronic lung changes with left basilar atelectasis. Minimal change from  the previous examination.   Electronically Signed   By: Gabriel Wood M.D.   On: 06/11/2013 12:12   Ct Head Wo Contrast  06/11/2013   CLINICAL DATA:  Right-sided weakness.  History of prior infarct.  EXAM: CT HEAD WITHOUT CONTRAST  TECHNIQUE: Contiguous axial images were obtained from the base of the skull through the vertex without intravenous contrast.  COMPARISON:  Brain MRI 07/30/2012.  Head CT scan 06/08/2013.  FINDINGS: Atrophy and chronic microvascular ischemic change are again seen. The left cerebral peduncle is hypoattenuating and swollen relative to the right consistent with subacute infarct. More inferiorly, there is hypoattenuation in the pons. These findings are unchanged since the most recent prior  exam. The brain is atrophic with chronic microvascular ischemic change. No hemorrhage, midline shift or abnormal extra-axial fluid collection is identified. There is no hydrocephalus or pneumocephalus. The calvarium is intact.  IMPRESSION: Findings consistent with subacute infarct in the left cerebral peduncle and pons. The appearance is not markedly changed since the most recent head CT. Negative for hemorrhage.   Electronically Signed   By: Gabriel Wood M.D.   On: 06/11/2013 12:50    Scheduled Meds: . aspirin EC  81 mg Oral Daily  . atorvastatin  40 mg Oral Daily  . budesonide-formoterol  2 puff Inhalation BID  . citalopram  10 mg Oral Daily  . dabigatran  150 mg Oral BID  . isosorbide mononitrate  60 mg Oral Daily  . meclizine  25 mg Oral Daily  . metoprolol succinate  25 mg Oral Daily  . multivitamin with minerals  1 tablet Oral QPM   Continuous Infusions: . sodium chloride 1,000 mL (06/11/13 1944)   Antibiotics Given (last 72 hours)   None      Active Problems:   Abdominal aneurysm without mention of rupture   Atrial fibrillation   CAD (coronary artery disease)   Hypertension   History of DVT of lower extremity   Stroke   CVA (cerebral infarction)    Time spent: 35 min    Gabriel Wood  [facility] Pager [phone] If 7PM-7AM, please contact night-coverage at [email or web address], password [facility] 06/12/2013, 8:48 AM  LOS: 1 day

## 2013-06-12 NOTE — Evaluation (Signed)
Physical Therapy Evaluation Patient Details Name: Gabriel Wood MRN: [id number] DOB: [birth year] Today's Date: 06/12/2013 Time: 2951-8841 PT Time Calculation (min): 22 min  PT Assessment / Plan / Recommendation History of Present Illness  Pt admit with left cerebral peduncle and pons infarct.    Clinical Impression  Pt admitted with above. Pt currently with functional limitations due to the deficits listed below (see PT Problem List).  Pt will benefit from skilled PT to increase their independence and safety with mobility to allow discharge to the venue listed below.     PT Assessment  Patient needs continued PT services    Follow Up Recommendations  SNF;Supervision/Assistance - 24 hour (Pt and family desire [facility] in [place])    Does the patient have the potential to tolerate intense rehabilitation      Barriers to Discharge        Equipment Recommendations  None recommended by PT    Recommendations for Other Services     Frequency Min 3X/week    Precautions / Restrictions Precautions Precautions: Fall Restrictions Weight Bearing Restrictions: No   Pertinent Vitals/Pain VSS, No pain      Mobility  Transfers Overall transfer level: Needs assistance Equipment used: Rolling walker (2 wheeled) Transfers: Sit to/from Stand Sit to Stand: Mod assist;+2 physical assistance General transfer comment: Pt needs cues for hand placement and assist for sit to stand secondary to poor ability for anterior lean as well as decr ability to use UEs due to left shoulder replacement and left melanoma removed as well as now right hemibody weakness.   Ambulation/Gait Ambulation/Gait assistance: +2 safety/equipment;Mod assist Ambulation Distance (Feet): 170 Feet (85 feet x 2) Assistive device: Rolling walker (2 wheeled) Gait Pattern/deviations: Step-through pattern;Decreased stride length;Wide base of support Gait velocity interpretation: at or above normal speed for  age/gender General Gait Details: Pt ambulates with RW with assist for steering RW as well as sequencing steps.  Pt at times with right knee buckling per family however no buckling today.  Pt impulsive and with poor safety awareness needing constant cues to slow down and attend to task.  Pt becomes more flexed in posture the longer he ambulates.  Pt limited using UEs secondary to previous total shoulder replacement on left UE and right hemibody weakness.   Modified Rankin (Stroke Patients Only) Pre-Morbid Rankin Score: Moderately severe disability Modified Rankin: Severe disability    Exercises     PT Diagnosis: Generalized weakness  PT Problem List: Decreased strength;Decreased activity tolerance;Decreased balance;Decreased mobility;Decreased knowledge of use of DME;Decreased safety awareness;Decreased knowledge of precautions PT Treatment Interventions: DME instruction;Gait training;Functional mobility training;Therapeutic activities;Therapeutic exercise;Balance training;Patient/family education     PT Goals(Current goals can be found in the care plan section) Acute Rehab PT Goals Patient Stated Goal: to go home after therapy PT Goal Formulation: With patient Time For Goal Achievement: 06/19/13 Potential to Achieve Goals: Good  Visit Information  Last PT Received On: 06/12/13 Assistance Needed: +2 History of Present Illness: Pt admit with left cerebral peduncle and pons infarct.         Prior [place] expects to be discharged to:: Private residence Living Arrangements: Children Available Help at Discharge: Family;Available 24 hours/day Type of Home: House Home Access: Stairs to enter [employer] of Steps: 1 Entrance Stairs-Rails: None Home Layout: One level Home Equipment: Cane - single point;Walker - 2 wheels;Shower seat - built in;Grab bars - tub/shower;Bedside commode (home O2) Prior Function Level of Independence: Independent with  assistive  device(s) Comments: Has been using RW Communication Communication: No difficulties Dominant Hand: Right    Cognition  Cognition Arousal/Alertness: Awake/alert Behavior During Therapy: WFL for tasks assessed/performed Overall Cognitive Status: Within Functional Limits for tasks assessed    Extremity/Trunk Assessment Upper Extremity Assessment Upper Extremity Assessment: Defer to OT evaluation Lower Extremity Assessment Lower Extremity Assessment: Generalized weakness;RLE deficits/detail RLE Deficits / Details: grossly 3-/5 Cervical / Trunk Assessment Cervical / Trunk Assessment: Kyphotic   Balance Balance Overall balance assessment: Needs assistance;History of Falls Standing balance support: Bilateral upper extremity supported;During functional activity Standing balance-Leahy Scale: Poor Standing balance comment: Needs bil UE support for balance.   End of Session PT - End of Session Equipment Utilized During Treatment: Gait belt;Oxygen Activity Tolerance: Patient limited by fatigue Patient left: with call bell/phone within reach;in bed;Other (comment);with family/visitor present (with Echo tech in room) Nurse Communication: Mobility status  GP     INGOLD,Laityn Bensen 06/12/2013, 1:06 PM [facility] Acute Rehabilitation [phone] (pager)

## 2013-06-12 NOTE — Evaluation (Signed)
Clinical/Bedside Swallow Evaluation Patient Details  Name: Gabriel Wood MRN: [id number] Date of Birth: [birth year]  Today's Date: 06/12/2013 Time: 0867-6195 SLP Time Calculation (min): 20 min  Past Medical History:  Past Medical History  Diagnosis Date  . History of colon polyps   . Anemia   . Chronic kidney disease     History of Kidney Stones  . Ulcer     history of Peptic Ulcer Disease  . IBS (irritable bowel syndrome)   . Anxiety   . BPH (benign prostatic hypertrophy)   . CAD (coronary artery disease)   . Atrial fibrillation   . AAA (abdominal aortic aneurysm)   . Arthritis   . Chronic anticoagulation     On Coumadin therapy ( A Fib)  . Hypertension   . Hyperlipidemia   . History of DVT of lower extremity   . Cancer Sept. 2013    Melanoma,Right  posterior shoulder  . Anginal pain   . Shortness of breath   . Transient ischemic attack (TIA) 07/2012  . Fall at home Dec. 2014  . CHF (congestive heart failure)   . COPD (chronic obstructive pulmonary disease)    Past Surgical History:  Past Surgical History  Procedure Laterality Date  . Appendectomy    . Cholecystectomy    . Tonsillectomy    . Rotator cuff repair      Right shoulder  . Coronary artery bypass graft    . Appendectomy    . Fracture surgery  2011    Left tibial ORIF by Dr. Sharol Given  . Ankle fusion  2011    Left ankle  . Lymph node biopsy    . Joint replacement Left Dec.  2014    Left shoulder replacement  . Kidney stones     HPI:  80 yo male adm with right sided weakness, facial weakness, slurred speech and drooling.  Pt with h/o CHF, CAD, HTN, afib.  Imaging found subacute left cerebral peduncle and pons subacute cva.  CXR showed chronic lung changes, bilateral atx.  BSE ordered as pt failed RN stroke swallow screen. Pt is dysarthric and will benefit from speech evaluation as well.  Daughter and pt report pt with some chronic right side weakness and speech deficits that are worse now.      Assessment / Plan / Recommendation Clinical Impression  Pt presents with clinical indications of moderate oropharyngeal dysphagia with likely aspiration of thin liquids via tsp, cup and straw- even with chin tuck posture.  Suspect premature spillage of liquids into pharynx/larynx, timing of swallow/respiration and decreased laryngeal elevation contributing to aspiration.  Suspect pharyngeal swallow is strong without indications of stasis.  Single ice chips, nectar thick liquids with CHIN TUCK, applesauce and soft solids managed without s/s of aspiration. Slow mastication with pt impulsivity increase asp risk with hard solids.  Pt and daughter report h/o coughing and associated it to COPD.  SLP advised to suspicion for neuro impact as well due to location of CVA.    Recommend soft/nectar thick with chin tuck with liquids and single ice chips with strict precautions.  Skilled intervention included educating pt/family to findings and reiforcement of effective clinical strategies.  SLP to follow for speech *pt with dysarthria* and swallow rehabiliation - MBS may be indicated if pt does not tolerate modified diet or can not be advanced clinically.  CXR negative at this time.  MD informed and orders received.     Aspiration Risk  Mild (with strict precautions)  Diet Recommendation Dysphagia 2 (Fine chop);Nectar-thick liquid;Ice chips PRN after oral care   Liquid Administration via: Straw Medication Administration: Whole meds with puree Supervision: Patient able to self feed;Full supervision/cueing for compensatory strategies Compensations: Slow rate;Small sips/bites Postural Changes and/or Swallow Maneuvers: Seated upright 90 degrees;Upright 30-60 min after meal;Chin tuck (tuck with sips, rest break if short of breath or coughing with po)    Other  Recommendations Oral Care Recommendations: Oral care before and after PO (due to level of oral deficits) Other Recommendations: Order thickener from  pharmacy   Follow Up Recommendations  24 hour supervision/assistance    Frequency and Duration min 2x/week  2 weeks   Pertinent Vitals/Pain Afebrile, decreased     Swallow Study Prior Functional Status   see hhx    General Date of Onset: 06/12/13 HPI: 80 yo male adm with right sided weakness, facial weakness, slurred speech and drooling.  Pt with h/o CHF, CAD, HTN, afib.  Imaging found subacute left cerebral peduncle and pons subacute cva.  CXR showed chronic lung changes, bilateral atx.  BSE ordered as pt failed RN stroke swallow screen. Pt is dysarthric and will benefit from speech evaluation as well.  Daughter and pt report pt with some chronic right side weakness and speech deficits that are worse now.   Type of Study: Bedside swallow evaluation Diet Prior to this Study: NPO Temperature Spikes Noted: No Respiratory Status: Nasal cannula History of Recent Intubation: No Behavior/Cognition: Alert;Cooperative (frustrated due to he states being told slp would see him last night) Oral Cavity - Dentition: Dentures, top;Dentures, bottom Self-Feeding Abilities: Able to feed self Patient Positioning: Upright in chair Baseline Vocal Quality: Low vocal intensity;Hoarse Volitional Cough: Strong Volitional Swallow: Able to elicit (with delay)    Oral/Motor/Sensory Function Overall Oral Motor/Sensory Function: Impaired (pt states deficits are exacerbation of baseline) Labial ROM: Reduced right Labial Symmetry: Abnormal symmetry right Labial Strength: Reduced Lingual Strength: Reduced Facial ROM: Reduced right Facial Symmetry: Right droop;Right drooping eyelid Facial Strength: Reduced Facial Sensation: Reduced Velum: Within Functional Limits Mandible: Within Functional Limits   Ice Chips Ice chips: Impaired Oral Phase Impairments: Reduced labial seal;Impaired anterior to posterior transit;Reduced lingual movement/coordination Oral Phase Functional Implications: Prolonged oral  transit Pharyngeal Phase Impairments: Suspected delayed Swallow   Thin Liquid Thin Liquid: Impaired Presentation: Cup;Straw;Spoon Oral Phase Impairments: Reduced lingual movement/coordination;Impaired anterior to posterior transit Oral Phase Functional Implications: Prolonged oral transit Pharyngeal  Phase Impairments: Cough - Immediate;Multiple swallows;Decreased hyoid-laryngeal movement;Throat Clearing - Delayed Other Comments: pt and daughter attribute cough to "secretions" from his COPD - however suspect multifactorial and neuro due to pons CVA    Nectar Thick Nectar Thick Liquid: Impaired Presentation: Cup;Spoon;Straw Oral Phase Impairments: Impaired anterior to posterior transit;Reduced lingual movement/coordination Oral phase functional implications: Prolonged oral transit Pharyngeal Phase Impairments: Decreased hyoid-laryngeal movement;Cough - Immediate Other Comments: chin tuck posture using straw prevented pt having s/s of aspiration   Honey Thick Honey Thick Liquid: Not tested   Puree Puree: Within functional limits Presentation: Self Fed;Spoon Other Comments: pt takes large boluses but no s/s of aspiration or oral stasis noted   Solid   GO    Solid: Impaired Presentation: Self Fed Oral Phase Impairments: Impaired mastication;Impaired anterior to posterior transit;Reduced lingual movement/coordination Pharyngeal Phase Impairments: Suspected delayed Elonda Husky, [facility] SLP [phone]

## 2013-06-12 NOTE — Progress Notes (Signed)
Stroke Team Progress Note  HISTORY Gabriel Wood is an 80 y.o. male with a history of atrial fibrillation, hypertension, hyperlipidemia, TIA, congestive heart failure and COPD, presenting with progressive weakness involving right extremities over the past couple of weeks. Patient had a CT scan of his head 3 days prior to admission  which showed findings consistent with acute stroke involving left cerebral peduncle and pons. He had gotten progressively weaker to the point that he was unable to walk without assistance on the morning of admission and was unable to use his right upper extremity at all. Repeat CT scan 06/11/2013 showed no significant change from 06/08/2013. NIH stroke score was 7.   LSN: Unclear  tPA Given: No: Unclear onset of acute stroke.  MRankin: 3  SUBJECTIVE Multiple family members present. The patient feels he is improving. The patient's family notes that his speech is better.  OBJECTIVE Most recent Vital Signs: Filed Vitals:   06/12/13 0008 06/12/13 0210 06/12/13 0400 06/12/13 0610  BP: 128/63 121/57 117/63 116/61  Pulse: 88 76 82 74  Temp: 98.1 F (36.7 C) 98.4 F (36.9 C) 98.2 F (36.8 C) 98 F (36.7 C)  TempSrc: Oral Oral Oral Oral  Resp: 20 20 18 20   SpO2: 100% 99% 99% 99%   CBG (last 3)   Recent Labs  06/11/13 1217  GLUCAP 85    IV Fluid Intake:   . sodium chloride 1,000 mL (06/11/13 1944)    MEDICATIONS  . aspirin EC  81 mg Oral Daily  . atorvastatin  40 mg Oral Daily  . budesonide-formoterol  2 puff Inhalation BID  . citalopram  10 mg Oral Daily  . dabigatran  150 mg Oral BID  . isosorbide mononitrate  60 mg Oral Daily  . meclizine  25 mg Oral Daily  . metoprolol succinate  25 mg Oral Daily  . multivitamin with minerals  1 tablet Oral QPM   PRN:  acetaminophen, acetaminophen, albuterol, furosemide, nitroGLYCERIN, promethazine, senna-docusate  Diet:  NPO no liquids Activity:  Up with assistance DVT Prophylaxis:  Pradaxa  CLINICALLY  SIGNIFICANT STUDIES Basic Metabolic Panel:  Recent Labs Lab 06/11/13 1137 06/11/13 1150 06/12/13 0543  NA 141 140 142  K 3.5* 3.3* 4.4  CL 100 96 104  CO2 28  --  27  GLUCOSE 102* 102* 87  BUN 20 21 14   CREATININE 1.20 1.50* 0.98  CALCIUM 9.9  --  9.7   Liver Function Tests:  Recent Labs Lab 06/11/13 1137  AST 16  ALT 10  ALKPHOS 64  BILITOT 0.4  PROT 6.1  ALBUMIN 3.4*   CBC:  Recent Labs Lab 06/11/13 1137 06/11/13 1150 06/12/13 0543  WBC 8.2  --  6.5  NEUTROABS 6.1  --   --   HGB 11.0* 12.2* 10.6*  HCT 33.6* 36.0* 33.2*  MCV 81.4  --  82.0  PLT 202  --  194   Coagulation:  Recent Labs Lab 06/11/13 1137  LABPROT 15.5*  INR 1.26   Cardiac Enzymes: No results found for this basename: CKTOTAL, CKMB, CKMBINDEX, TROPONINI,  in the last 168 hours Urinalysis:  Recent Labs Lab 06/11/13 1304  COLORURINE YELLOW  LABSPEC 1.023  PHURINE 6.0  GLUCOSEU NEGATIVE  HGBUR NEGATIVE  BILIRUBINUR NEGATIVE  KETONESUR NEGATIVE  PROTEINUR NEGATIVE  UROBILINOGEN 0.2  NITRITE NEGATIVE  LEUKOCYTESUR NEGATIVE   Lipid Panel    Component Value Date/Time   CHOL 102 06/12/2013 0543   TRIG 90 06/12/2013 0543   HDL 42  06/12/2013 0543   CHOLHDL 2.4 06/12/2013 0543   VLDL 18 06/12/2013 0543   LDLCALC 42 06/12/2013 0543   HgbA1C  Lab Results  Component Value Date   HGBA1C 5.4 07/30/2012    Urine Drug Screen:     Component Value Date/Time   LABOPIA NONE DETECTED 06/11/2013 1304   COCAINSCRNUR NONE DETECTED 06/11/2013 1304   LABBENZ NONE DETECTED 06/11/2013 1304   AMPHETMU NONE DETECTED 06/11/2013 1304   THCU NONE DETECTED 06/11/2013 1304   LABBARB NONE DETECTED 06/11/2013 1304    Alcohol Level:  Recent Labs Lab 06/11/13 1137  ETH <11    Dg Chest 2 View 06/11/2013    Chronic lung changes with left basilar atelectasis. Minimal change from the previous examination.      Ct Head Wo Contrast 06/11/2013    Findings consistent with subacute infarct in the left cerebral peduncle and pons.  The appearance is not markedly changed since the most recent head CT. Negative for hemorrhage.     MRI of the brain  pending  MRA of the brain  pending  2D Echocardiogram  pending  Carotid Doppler  pending   EKG Atrial fibrillation. Ventricular response 85 beats per minute. For complete results please see formal report.   Therapy Recommendations pending  Physical Exam   Neurologic Examination:  Mental Status:  Alert, oriented, thought content appropriate. Speech slightly slurred without evidence of aphasia. Able to follow commands without difficulty.  Cranial Nerves:  II-Visual fields were normal.  III/IV/VI-Pupils were equal and reacted. Extraocular movements were full and conjugate.  V/VII-no facial numbness; mild right lower facial weakness.  VIII-normal.  X-slightly slurred speech; symmetrical palatal movement..  Motor: Moderately severe weakness proximally and moderate weakness distally involving right upper extremity; moderate proximal weakness of right lower extremity; normal strength of left extremities; normal muscle tone throughout.  Sensory: Normal throughout.  Deep Tendon Reflexes: Asymmetric with slightly greater responses elicited from right extremities compared to left extremities.  Plantars: Mute bilaterally  Cerebellar: Normal finger-to-nose testing with use of right upper extremity.  Carotid auscultation: Normal  ASSESSMENT [name] is a 80 y.o. male presenting with progressive right hemiparesis. TPA was not initiated secondary to recent stroke. A CT scan revealed findings consistent with subacute infarct in the left cerebral peduncle and pons. An MRI/MRA is pending. Infarct felt to be thrombotic secondary to small vessel disease. On aspirin 81 mg orally every day and Pradaxa prior to admission. Now on aspirin 81 mg orally every day and Pradaxa for secondary stroke prevention. Patient with resultant right hemiparesis. Stroke work up  underway.   Atrial fibrillation - Pradaxa and aspirin 81 mg daily prior to admission  Cholesterol 102 LDL 42 - on Lipitor prior to admission  Coronary artery disease / CHF  Hypertension - mildly low blood pressures  History of left lower extremity DVT  Anemia   Hospital day # 1  TREATMENT/PLAN  Continue aspirin 81 mg orally every day and Pradaxa for secondary stroke prevention.  Await MRI/MRA, 2-D echo, and carotid Dopplers  Await therapy evaluations   Mikey Bussing PA-C [facility] Pager [phone] 06/12/2013, 9:21 AM  I have personally obtained a history, examined the patient, evaluated imaging results, and formulated the assessment and plan of care. I agree with the above.  con't Pradaxa and ASA 81. Anticoagulation reduces risk of embolic strokes but does not eliminate them. No evidence that one anticoagulation is better then another.  Leotis Pain

## 2013-06-12 NOTE — Progress Notes (Signed)
Echocardiogram 2D Echocardiogram has been performed.  Gabriel Wood 06/12/2013, 11:50 AM

## 2013-06-13 MED ORDER — METOPROLOL SUCCINATE ER 25 MG PO TB24
25.0000 mg | ORAL_TABLET | Freq: Every day | ORAL | Status: DC
  Administered 2013-06-14 – 2013-06-17 (×4): 25 mg via ORAL
  Filled 2013-06-13 (×5): qty 1

## 2013-06-13 NOTE — Progress Notes (Signed)
Stroke Team Progress Note  HISTORY Gabriel Wood is an 80 y.o. male with a history of atrial fibrillation, hypertension, hyperlipidemia, TIA, congestive heart failure and COPD, presenting with progressive weakness involving right extremities over the past couple of weeks. Patient had a CT scan of his head 3 days prior to admission  which showed findings consistent with acute stroke involving left cerebral peduncle and pons. He had gotten progressively weaker to the point that he was unable to walk without assistance on the morning of admission and was unable to use his right upper extremity at all. Repeat CT scan 06/11/2013 showed no significant change from 06/08/2013. NIH stroke score was 7.   LSN: Unclear  tPA Given: No: Unclear onset of acute stroke.  MRankin: 3  SUBJECTIVE No family members present this morning. The patient voices no complaints.  OBJECTIVE Most recent Vital Signs: Filed Vitals:   06/12/13 2123 06/13/13 0105 06/13/13 0530 06/13/13 0901  BP: 108/66 121/57 105/55   Pulse: 78 81 90   Temp: 98.1 F (36.7 C) 98.9 F (37.2 C) 97.7 F (36.5 C)   TempSrc: Oral Oral Oral   Resp: 20 20 20 20   Height:   5\' 3"  (1.6 m)   Weight:   67.6 kg (149 lb 0.5 oz)   SpO2: 96% 97% 96% 96%   CBG (last 3)   Recent Labs  06/11/13 1217  GLUCAP 85    IV Fluid Intake:      MEDICATIONS  . aspirin EC  81 mg Oral Daily  . atorvastatin  40 mg Oral Daily  . budesonide-formoterol  2 puff Inhalation BID  . citalopram  10 mg Oral Daily  . dabigatran  150 mg Oral BID  . isosorbide mononitrate  60 mg Oral Daily  . meclizine  25 mg Oral Daily  . metoprolol succinate  25 mg Oral Daily  . multivitamin with minerals  1 tablet Oral QPM   PRN:  acetaminophen, acetaminophen, albuterol, furosemide, nitroGLYCERIN, promethazine, RESOURCE THICKENUP CLEAR, senna-docusate  Diet:  Dysphagia II nectar thick liquids Activity:  Up with assistance DVT Prophylaxis:  Pradaxa  CLINICALLY  SIGNIFICANT STUDIES Basic Metabolic Panel:   Recent Labs Lab 06/11/13 1137 06/11/13 1150 06/12/13 0543  NA 141 140 142  K 3.5* 3.3* 4.4  CL 100 96 104  CO2 28  --  27  GLUCOSE 102* 102* 87  BUN 20 21 14   CREATININE 1.20 1.50* 0.98  CALCIUM 9.9  --  9.7   Liver Function Tests:   Recent Labs Lab 06/11/13 1137  AST 16  ALT 10  ALKPHOS 64  BILITOT 0.4  PROT 6.1  ALBUMIN 3.4*   CBC:   Recent Labs Lab 06/11/13 1137 06/11/13 1150 06/12/13 0543  WBC 8.2  --  6.5  NEUTROABS 6.1  --   --   HGB 11.0* 12.2* 10.6*  HCT 33.6* 36.0* 33.2*  MCV 81.4  --  82.0  PLT 202  --  194   Coagulation:   Recent Labs Lab 06/11/13 1137  LABPROT 15.5*  INR 1.26   Cardiac Enzymes: No results found for this basename: CKTOTAL, CKMB, CKMBINDEX, TROPONINI,  in the last 168 hours Urinalysis:   Recent Labs Lab 06/11/13 1304  COLORURINE YELLOW  LABSPEC 1.023  PHURINE 6.0  GLUCOSEU NEGATIVE  HGBUR NEGATIVE  BILIRUBINUR NEGATIVE  KETONESUR NEGATIVE  PROTEINUR NEGATIVE  UROBILINOGEN 0.2  NITRITE NEGATIVE  LEUKOCYTESUR NEGATIVE   Lipid Panel    Component Value Date/Time   CHOL 102  06/12/2013 0543   TRIG 90 06/12/2013 0543   HDL 42 06/12/2013 0543   CHOLHDL 2.4 06/12/2013 0543   VLDL 18 06/12/2013 0543   LDLCALC 42 06/12/2013 0543   HgbA1C  Lab Results  Component Value Date   HGBA1C 5.4 06/12/2013    Urine Drug Screen:     Component Value Date/Time   LABOPIA NONE DETECTED 06/11/2013 1304   COCAINSCRNUR NONE DETECTED 06/11/2013 1304   LABBENZ NONE DETECTED 06/11/2013 1304   AMPHETMU NONE DETECTED 06/11/2013 1304   THCU NONE DETECTED 06/11/2013 1304   LABBARB NONE DETECTED 06/11/2013 1304    Alcohol Level:   Recent Labs Lab 06/11/13 1137  ETH <11    Dg Chest 2 View 06/11/2013    Chronic lung changes with left basilar atelectasis. Minimal change from the previous examination.      Ct Head Wo Contrast 06/11/2013    Findings consistent with subacute infarct in the left cerebral  peduncle and pons. The appearance is not markedly changed since the most recent head CT. Negative for hemorrhage.     MRI / MRA of the brain  06/12/2013 1. 2 cm heterogeneous lesion involving the left upper pons/lower midbrain with surrounding edema. Findings may reflect a subacute infarct, however postcontrast imaging and follow-up MRI are recommended to exclude a mass.  2. Mild chronic small vessel ischemic disease.  3. Motion degraded MRA without evidence of new intracranial arterial occlusion. 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   2D Echocardiogram  ejection fraction 55-60%. No cardiac source of emboli identified.  Carotid Doppler  pending   EKG Atrial fibrillation. Ventricular response 85 beats per minute. For complete results please see formal report.   Therapy Recommendations SNF vs CIR. Apparently family is requesting SNF.  Physical Exam   Neurologic Examination:  Mental Status:  Alert, oriented, thought content appropriate. Speech slightly slurred without evidence of aphasia. Able to follow commands without difficulty.  Cranial Nerves:  II-Visual fields were normal.  III/IV/VI-Pupils were equal and reacted. Extraocular movements were full and conjugate.  V/VII-no facial numbness; mild right lower facial weakness.  VIII-normal.  X-slightly slurred speech; symmetrical palatal movement..  Motor: Moderately severe weakness proximally and moderate weakness distally involving right upper extremity; moderate proximal weakness of right lower extremity; normal strength of left extremities; normal muscle tone throughout.  Sensory: Normal throughout.  Deep Tendon Reflexes: Asymmetric with slightly greater responses elicited from right extremities compared to left extremities.  Plantars: Mute bilaterally  Cerebellar: Normal finger-to-nose testing with use of right upper extremity.  Carotid auscultation:  Normal  ASSESSMENT [name] is a 80 y.o. male presenting with progressive right hemiparesis. TPA was not initiated secondary to recent stroke. A CT scan revealed findings consistent with subacute infarct in the left cerebral peduncle and pons. An MRI/MRA is pending. Infarct felt to be thrombotic secondary to small vessel disease. On aspirin 81 mg orally every day and Pradaxa prior to admission. Now on aspirin 81 mg orally every day and Pradaxa for secondary stroke prevention. Patient with resultant right hemiparesis. Stroke work up underway.   Atrial fibrillation - Pradaxa and aspirin 81 mg daily prior to admission  Cholesterol 102 LDL 42 - on Lipitor prior to admission  Coronary artery disease / CHF  Hypertension - mildly low blood pressures  History of left lower extremity DVT  Anemia   Hospital day # 2  TREATMENT/PLAN  Continue aspirin 81 mg orally every  day and Pradaxa for secondary stroke prevention.  Await carotid Dopplers  Therapists - SNF vs CIR. Apparently family is requesting SNF.  Followup MRI to exclude mass recommended by radiology.  I spoke with the patient's daughter this afternoon who reported that the patient had a large melanoma removed from his right shoulder at [facility] in February of 2014. The patient will no doubt need further imaging to rule out a mass as recommended by the radiologist - please see MRI report above.   Mikey Bussing PA-C [facility] Pager [phone] 06/13/2013, 9:21 AM  I have personally obtained a history, examined the patient, evaluated imaging results, and formulated the assessment and plan of care. I agree with the above.  MRI, lesion in pons and midbrain. Since there is hx of melanoma would repeat MRI with contrast to see if there is any enhancement.  Leotis Pain .

## 2013-06-13 NOTE — Progress Notes (Signed)
VASCULAR LAB PRELIMINARY  PRELIMINARY  PRELIMINARY  PRELIMINARY  Carotid Dopplers completed.    Preliminary report:  1-39% ICA stenosis.  Vertebral artery flow is antegrade.  Nnamdi Dacus, RVT 06/13/2013, 10:57 AM

## 2013-06-13 NOTE — Progress Notes (Signed)
Rehab Admissions Coordinator Note:  Patient was screened by Cleatrice Burke for appropriateness for an Inpatient Acute Rehab Consult.  At this time, we are recommending Inpatient Rehab consult if family would like to consider other rehab venue besides [facility]. Please order if family would like to pursue CIR.  Cleatrice Burke 06/13/2013, 6:40 PM  I can be reached at [phone].

## 2013-06-13 NOTE — Progress Notes (Signed)
06/13/13 0901 patient was sitting  In the chair. He was off his oxygen. I asked if he was suppose to be on it. He stated yes but his ear was hurting and he was not going to put it back on. I place the [place] in his lap. Flow meter was on 2L, I told him to put on if he got short of breath. I spoke to [employer].She said she would look out for him.

## 2013-06-13 NOTE — Progress Notes (Signed)
PROGRESS NOTE  Gabriel Wood [id number] DOB: [birth year] DOA: 06/11/2013 PCP: Simona Huh, MD  Assessment/Plan:  CVA (cerebral infarction)  -As discussed above, patient presenting with subacute CVA on aspirin and pradaxa -MRI/MRA:  2 cm heterogeneous lesion involving the left upper pons/lower midbrain with surrounding edema. Findings may reflect a subacute infarct, however postcontrast imaging and follow-up MRI are recommended to exclude a mass.  carotid Dopplers Echo: normal EF, mild AS lipid profile: LDL 42  A1c: 5.4 -PT OT SLP consults  -Will continue Pradaxa and aspirin as per neuro recommendations  -neuro consult -PT- SNF -DYS diet  Atrial fibrillation  -Rate controlled, Continue metoprolol  -Continue Pradaxa and aspirin   CAD (coronary artery disease)  -continue outpatient medications   Hypertension  - Continue outpatient medications - with holding parameters  History of DVT of lower extremity  -Continue pradaxa as above   Hypokalemia  -Replace potassium      Code Status: full Family Communication:  Disposition Plan:    Consultants:  neuro  Procedures:    Antibiotics:    HPI/Subjective: Doing better today No new c/o No overnight events  Objective: Filed Vitals:   06/13/13 0901  BP:   Pulse:   Temp:   Resp: 20   No intake or output data in the 24 hours ending 06/13/13 0910 Filed Weights   06/13/13 0530  Weight: 67.6 kg (149 lb 0.5 oz)    Exam:   General:  A+Ox3, NAD  Cardiovascular: irr  Respiratory: clear anterior  Abdomen: +Bs, soft  Musculoskeletal: moves all 4 ext   Data Reviewed: Basic Metabolic Panel:  Recent Labs Lab 06/11/13 1137 06/11/13 1150 06/12/13 0543  NA 141 140 142  K 3.5* 3.3* 4.4  CL 100 96 104  CO2 28  --  27  GLUCOSE 102* 102* 87  BUN 20 21 14   CREATININE 1.20 1.50* 0.98  CALCIUM 9.9  --  9.7   Liver Function Tests:  Recent Labs Lab 06/11/13 1137  AST 16  ALT 10    ALKPHOS 64  BILITOT 0.4  PROT 6.1  ALBUMIN 3.4*   No results found for this basename: LIPASE, AMYLASE,  in the last 168 hours No results found for this basename: AMMONIA,  in the last 168 hours CBC:  Recent Labs Lab 06/11/13 1137 06/11/13 1150 06/12/13 0543  WBC 8.2  --  6.5  NEUTROABS 6.1  --   --   HGB 11.0* 12.2* 10.6*  HCT 33.6* 36.0* 33.2*  MCV 81.4  --  82.0  PLT 202  --  194   Cardiac Enzymes: No results found for this basename: CKTOTAL, CKMB, CKMBINDEX, TROPONINI,  in the last 168 hours BNP (last 3 results)  Recent Labs  05/04/13 1930  PROBNP 604.2*   CBG:  Recent Labs Lab 06/11/13 1217  GLUCAP 85    No results found for this or any previous visit (from the past 240 hour(s)).   Studies: Dg Chest 2 View  06/11/2013   CLINICAL DATA:  Cough and shortness of breath.  EXAM: CHEST  2 VIEW  COMPARISON:  06/08/2013 and 05/03/2013  FINDINGS: Chronic elevation of the right hemidiaphragm. Again noted are postsurgical changes in the left shoulder consistent with a shoulder arthroplasty. Patient also has median sternotomy wires. Chronic linear densities in the right lung and anterior chest on the lateral view. No evidence for acute airspace disease. There is volume loss at the left lung base. Heart size is stable. Again noted is a compression  fracture in the lower thoracic spine.  IMPRESSION: Chronic lung changes with left basilar atelectasis. Minimal change from the previous examination.   Electronically Signed   By: Markus Daft M.D.   On: 06/11/2013 12:12   Ct Head Wo Contrast  06/11/2013   CLINICAL DATA:  Right-sided weakness.  History of prior infarct.  EXAM: CT HEAD WITHOUT CONTRAST  TECHNIQUE: Contiguous axial images were obtained from the base of the skull through the vertex without intravenous contrast.  COMPARISON:  Brain MRI 07/30/2012.  Head CT scan 06/08/2013.  FINDINGS: Atrophy and chronic microvascular ischemic change are again seen. The left cerebral peduncle is  hypoattenuating and swollen relative to the right consistent with subacute infarct. More inferiorly, there is hypoattenuation in the pons. These findings are unchanged since the most recent prior exam. The brain is atrophic with chronic microvascular ischemic change. No hemorrhage, midline shift or abnormal extra-axial fluid collection is identified. There is no hydrocephalus or pneumocephalus. The calvarium is intact.  IMPRESSION: Findings consistent with subacute infarct in the left cerebral peduncle and pons. The appearance is not markedly changed since the most recent head CT. Negative for hemorrhage.   Electronically Signed   By: Inge Rise M.D.   On: 06/11/2013 12:50   Mr Brain Wo Contrast  06/12/2013   CLINICAL DATA:  Right-sided weakness, right facial weakness, slurred speech. Stroke.  EXAM: MRI HEAD WITHOUT CONTRAST  MRA HEAD WITHOUT CONTRAST  TECHNIQUE: Multiplanar, multiecho pulse sequences of the brain and surrounding structures were obtained without intravenous contrast. Angiographic images of the head were obtained using MRA technique without contrast.  COMPARISON:  Head CT 06/11/2013 and MRI/ MRA 07/30/2012  FINDINGS: MRI HEAD FINDINGS  There is a 2.1 x 1.9 cm focus of heterogeneous, predominantly hyperintense T2 signal centered in the left upper pons/lower midbrain. There is a thin rim of slightly increased diffusion weighted signal at its periphery which may demonstrate minimally restricted diffusion. There is facilitated diffusion in the central portion of the lesion. There is no intracranial hemorrhage. The left pons is mildly expanded, and there is T2/FLAIR hyperintensity throughout the pons and extending into the left middle cerebellar peduncle and left greater than right cerebral peduncle is compatible with edema. There is a small amount of edema in the posterior limb of internal capsule on the left as well.  There is moderate cerebral atrophy. Small, scattered foci of T2 hyperintensity  are present in the subcortical and deep cerebral white matter bilaterally, not significantly changed and compatible with mild chronic small vessel ischemic disease. There is no midline shift or extra-axial fluid collection. Orbits are unremarkable. Paranasal sinuses and mastoid air cells are clear.  MRA HEAD FINDINGS  Images are moderately degraded by motion. Flow is again not identified in the distal right vertebral artery, consistent with proximal occlusion. The visualized distal left vertebral artery is patent without high-grade stenosis. PICAs are not well visualized. Basilar artery is patent without evidence of significant stenosis. SCA origins are grossly patent. There is a fetal origin of the right PCA. PCAs are otherwise grossly unremarkable.  Internal carotid arteries are patent from skullbase to carotid termini. Bilateral carotid siphon irregularity is again seen. There is again evidence of high-grade stenosis in the anterior left cavernous ICA. The left A1 segment is again noted to be dominant with the azygos ACA anatomy. Relatively symmetric flow is identified within the MCAs with mild branch vessel irregularity bilaterally. No intracranial aneurysm is identified.  IMPRESSION: 1. 2 cm heterogeneous lesion involving the  left upper pons/lower midbrain with surrounding edema. Findings may reflect a subacute infarct, however postcontrast imaging and follow-up MRI are recommended to exclude a mass. 2. Mild chronic small vessel ischemic disease. 3. Motion degraded MRA without evidence of new intracranial arterial occlusion. 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   Electronically Signed   By: Logan Bores   On: 06/12/2013 14:37   Mr Jodene Nam Head/brain Wo Cm  06/12/2013   CLINICAL DATA:  Right-sided weakness, right facial weakness, slurred speech. Stroke.  EXAM: MRI HEAD WITHOUT CONTRAST  MRA HEAD WITHOUT CONTRAST  TECHNIQUE: Multiplanar,  multiecho pulse sequences of the brain and surrounding structures were obtained without intravenous contrast. Angiographic images of the head were obtained using MRA technique without contrast.  COMPARISON:  Head CT 06/11/2013 and MRI/ MRA 07/30/2012  FINDINGS: MRI HEAD FINDINGS  There is a 2.1 x 1.9 cm focus of heterogeneous, predominantly hyperintense T2 signal centered in the left upper pons/lower midbrain. There is a thin rim of slightly increased diffusion weighted signal at its periphery which may demonstrate minimally restricted diffusion. There is facilitated diffusion in the central portion of the lesion. There is no intracranial hemorrhage. The left pons is mildly expanded, and there is T2/FLAIR hyperintensity throughout the pons and extending into the left middle cerebellar peduncle and left greater than right cerebral peduncle is compatible with edema. There is a small amount of edema in the posterior limb of internal capsule on the left as well.  There is moderate cerebral atrophy. Small, scattered foci of T2 hyperintensity are present in the subcortical and deep cerebral white matter bilaterally, not significantly changed and compatible with mild chronic small vessel ischemic disease. There is no midline shift or extra-axial fluid collection. Orbits are unremarkable. Paranasal sinuses and mastoid air cells are clear.  MRA HEAD FINDINGS  Images are moderately degraded by motion. Flow is again not identified in the distal right vertebral artery, consistent with proximal occlusion. The visualized distal left vertebral artery is patent without high-grade stenosis. PICAs are not well visualized. Basilar artery is patent without evidence of significant stenosis. SCA origins are grossly patent. There is a fetal origin of the right PCA. PCAs are otherwise grossly unremarkable.  Internal carotid arteries are patent from skullbase to carotid termini. Bilateral carotid siphon irregularity is again seen. There is  again evidence of high-grade stenosis in the anterior left cavernous ICA. The left A1 segment is again noted to be dominant with the azygos ACA anatomy. Relatively symmetric flow is identified within the MCAs with mild branch vessel irregularity bilaterally. No intracranial aneurysm is identified.  IMPRESSION: 1. 2 cm heterogeneous lesion involving the left upper pons/lower midbrain with surrounding edema. Findings may reflect a subacute infarct, however postcontrast imaging and follow-up MRI are recommended to exclude a mass. 2. Mild chronic small vessel ischemic disease. 3. Motion degraded MRA without evidence of new intracranial arterial occlusion. 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   Electronically Signed   By: Logan Bores   On: 06/12/2013 14:37    Scheduled Meds: . aspirin EC  81 mg Oral Daily  . atorvastatin  40 mg Oral Daily  . budesonide-formoterol  2 puff Inhalation BID  . citalopram  10 mg Oral Daily  . dabigatran  150 mg Oral BID  . isosorbide mononitrate  60 mg Oral Daily  . meclizine  25 mg Oral Daily  .  metoprolol succinate  25 mg Oral Daily  . multivitamin with minerals  1 tablet Oral QPM   Continuous Infusions: . sodium chloride 50 mL/hr at 06/12/13 2119   Antibiotics Given (last 72 hours)   None      Active Problems:   Atrial fibrillation   CAD (coronary artery disease)   Hypertension   Hyperlipidemia   History of DVT of lower extremity   Stroke   CVA (cerebral infarction)    Time spent: 35 min    Gabriel Wood  [facility] Pager [phone] If 7PM-7AM, please contact night-coverage at [email or web address], password [facility] 06/13/2013, 9:10 AM  LOS: 2 days

## 2013-06-14 NOTE — Progress Notes (Signed)
Occupational Therapy Treatment Patient Details Name: Gabriel Wood MRN: [id number] DOB: May 06, 1923 Today's Date: 06/14/2013 Time: 9811-9147 OT Time Calculation (min): 42 min  OT Assessment / Plan / Recommendation  History of present illness Pt admit with left cerebral peduncle and pons infarct.     OT comments  Pt with improving balance and Rt. UE function.  He continues with weakness Rt. Wrist and finger extension with exception of Rt. Middle finger.   Pt reports diplopia is only present with Lt. Gaze and has been present for at least a month or longer.   Follow Up Recommendations  CIR    Barriers to Discharge       Equipment Recommendations  None recommended by OT    Recommendations for Other Services Rehab consult  Frequency Min 3X/week   Progress towards OT Goals Progress towards OT goals: Progressing toward goals  Plan Discharge plan remains appropriate    Precautions / Restrictions Precautions Precautions: Fall Restrictions Weight Bearing Restrictions: No   Pertinent Vitals/Pain     ADL  Grooming: Wash/dry hands;Teeth care;Minimal assistance Lower Body Dressing: Moderate assistance Where Assessed - Lower Body Dressing: Supported sit to Lobbyist: Minimal assistance Toilet Transfer Method: Sit to stand;Stand pivot Toilet Transfer Equipment: Comfort height toilet Transfers/Ambulation Related to ADLs: Min A for ambulation with walker ADL Comments: Pt indicates that diplopia has been present with Lt. gaze > 46mos.   Pt indicates that Rt. UE shoulder weakness has been present for several months or longer.  Pt with essentially only ~20* shoulder flexion.  He demonstrates weakness of Rt wrist and finger extensors with ~30-40* exentsor lag, with the exception of Rt. middle finger which demonstrates full extension.  Ataxia note Rt. UE.  Pt fatigues with ADLs tasks and had to sit after brushing teeth    OT Diagnosis:    OT Problem List:   OT Treatment  Interventions:     OT Goals(current goals can now be found in the care plan section) ADL Goals Pt Will Perform Eating: with set-up;with adaptive utensils;sitting Pt Will Perform Grooming: with set-up;with adaptive equipment;sitting Pt Will Perform Upper Body Bathing: with min assist;sitting;with adaptive equipment Pt Will Transfer to Toilet: with max assist;bedside commode Additional ADL Goal #1: Pt will demonstrate functional use of RT UE for self feeding 50% of meal  Visit Information  Last OT Received On: 06/14/13 Assistance Needed: +1 History of Present Illness: Pt admit with left cerebral peduncle and pons infarct.      Subjective Data      Prior Functioning       Cognition  Cognition Arousal/Alertness: Awake/alert Behavior During Therapy: WFL for tasks assessed/performed Overall Cognitive Status: Within Functional Limits for tasks assessed    Mobility  Bed Mobility Overal bed mobility: Needs Assistance Bed Mobility: Sit to Supine Supine to sit: Min assist Sit to supine: Min guard General bed mobility comments: A to support trunk. Cues for positioning Transfers Overall transfer level: Needs assistance Equipment used: Rolling walker (2 wheeled) Transfers: Sit to/from [facility] Sit to Stand: Min assist Stand pivot transfers: Min assist General transfer comment: Pt has to rock forward multiple times in order to stand     Exercises  General Exercises - Lower Extremity Long Arc Quad: AROM;Both;15 reps Hip Flexion/Marching: AROM;Both;15 reps Other Exercises Other Exercises: Pt was instructed in active finger extension and wrist exercises and demonstrated understanding    Balance Balance Overall balance assessment: Needs assistance Standing balance support: Single extremity supported Standing balance-Leahy Scale:  Fair  End of Session OT - End of Session Equipment Utilized During Treatment: Rolling walker Activity Tolerance: Patient tolerated  treatment well Patient left: in bed;with call bell/phone within reach;with bed alarm set Nurse Communication: Mobility status;Precautions  [place], [place] Luppino M 06/14/2013, 4:54 PM

## 2013-06-14 NOTE — Progress Notes (Signed)
   CARE MANAGEMENT NOTE 06/14/2013  Patient:  Gabriel Wood, Gabriel Wood   Account Number:  [id number]  Date Initiated:  06/14/2013  Documentation initiated by:  Olga Coaster  Subjective/Objective Assessment:   ADMITTED FOR STROKE     Action/Plan:   CM FOLLOWING FOR DCP   Anticipated DC Date:  06/18/2013   Anticipated DC Plan:  SKILLED NURSING FACILITY  In-house referral  Clinical Social Worker      DC Planning Services  CM consult         Status of service:  In process, will continue to follow Medicare Important Message given?   (If response is "NO", the following Medicare IM given date fields will be blank)  Per UR Regulation:  Reviewed for med. necessity/level of care/duration of stay  Comments:  3/9/2015Mindi Slicker RN,BSN,MHA [phone]

## 2013-06-14 NOTE — Clinical Social Work Psychosocial (Signed)
Clinical Social Work Department BRIEF PSYCHOSOCIAL ASSESSMENT 06/14/2013  Patient:  GIANNIS, CORPUZ     Account Number:  [id number]     Admit date:  06/11/2013  Clinical Social Worker:  Donna Christen  Date/Time:  06/14/2013 12:34 PM  Referred by:  Physician  Date Referred:  06/14/2013 Referred for  SNF Placement   Other Referral:   none.   Interview type:  Patient Other interview type:   none.    PSYCHOSOCIAL DATA Living Status:  FAMILY Admitted from facility:   Level of care:   Primary support name:  Trevaughn Schear Primary support relationship to patient:  CHILD, ADULT Degree of support available:   Strong support system.    CURRENT CONCERNS Current Concerns  Post-Acute Placement   Other Concerns:   none.    SOCIAL WORK ASSESSMENT / PLAN CSW met with pt at bedside to discuss discharge disposition. CSW explained CSW role with [facility]. Pt was agreeable to speaking with CSW. Per pt, he was living at home with his son and daughter-in-law prior to admission to [facility]. Pt stated that his son and daughter-in-law are home throughout the day, so he is very rarely left home unsupervised. Pt stated that he was agreeable to SNF placement if he could be accepted to [facility] in [place]. CSW to continue to follow and assist with discharge planning needs.   Assessment/plan status:  Psychosocial Support/Ongoing Assessment of Needs Other assessment/ plan:   none.   Information/referral to community resources:   [employer] bed offers.    PATIENTS/FAMILYS RESPONSE TO PLAN OF CARE: Pt is agreeable to CSW plan of care if he is admitted to [facility] in [place]. CSW has called and left message with admissions liaison at [facility] her of referral sent.       Pati Gallo, [place] Social Worker [phone]

## 2013-06-14 NOTE — Progress Notes (Signed)
Stroke Team Progress Note  HISTORY Gabriel Wood is an 80 y.o. male with a history of atrial fibrillation, hypertension, hyperlipidemia, TIA, congestive heart failure and COPD, presenting with progressive weakness involving right extremities over the past couple of weeks. Patient had a CT scan of his head 3 days prior to admission  which showed findings consistent with acute stroke involving left cerebral peduncle and pons. He had gotten progressively weaker to the point that he was unable to walk without assistance on the morning of admission and was unable to use his right upper extremity at all. Repeat CT scan 06/11/2013 showed no significant change from 06/08/2013. NIH stroke score was 7.  LSN: Unclear tPA Given: No: Unclear onset of acute stroke.   SUBJECTIVE No one at the bedside. He reports abnormal balance for 2-3 weeks, he cannot remember when his slurred speech started.  OBJECTIVE Most recent Vital Signs: Filed Vitals:   06/13/13 2145 06/14/13 0150 06/14/13 0523 06/14/13 0935  BP: 118/65 112/74 143/80 109/67  Pulse: 100 75 89 89  Temp: 97.8 F (36.6 C) 98 F (36.7 C) 97.9 F (36.6 C) 98.1 F (36.7 C)  TempSrc: Oral Oral Oral Oral  Resp: 18 18 18 18   Height:      Weight:      SpO2: 95% 94% 100% 100%   CBG (last 3)   Recent Labs  06/11/13 1217  GLUCAP 85    IV Fluid Intake:      MEDICATIONS  . aspirin EC  81 mg Oral Daily  . atorvastatin  40 mg Oral Daily  . budesonide-formoterol  2 puff Inhalation BID  . citalopram  10 mg Oral Daily  . dabigatran  150 mg Oral BID  . isosorbide mononitrate  60 mg Oral Daily  . meclizine  25 mg Oral Daily  . metoprolol succinate  25 mg Oral Daily  . multivitamin with minerals  1 tablet Oral QPM   PRN:  acetaminophen, acetaminophen, albuterol, furosemide, nitroGLYCERIN, promethazine, RESOURCE THICKENUP CLEAR, senna-docusate  Diet:  Dysphagia II nectar thick liquids Activity:  Up with assistance DVT Prophylaxis:   Pradaxa  CLINICALLY SIGNIFICANT STUDIES Basic Metabolic Panel:   Recent Labs Lab 06/11/13 1137 06/11/13 1150 06/12/13 0543  NA 141 140 142  K 3.5* 3.3* 4.4  CL 100 96 104  CO2 28  --  27  GLUCOSE 102* 102* 87  BUN 20 21 14   CREATININE 1.20 1.50* 0.98  CALCIUM 9.9  --  9.7   Liver Function Tests:   Recent Labs Lab 06/11/13 1137  AST 16  ALT 10  ALKPHOS 64  BILITOT 0.4  PROT 6.1  ALBUMIN 3.4*   CBC:   Recent Labs Lab 06/11/13 1137 06/11/13 1150 06/12/13 0543  WBC 8.2  --  6.5  NEUTROABS 6.1  --   --   HGB 11.0* 12.2* 10.6*  HCT 33.6* 36.0* 33.2*  MCV 81.4  --  82.0  PLT 202  --  194   Coagulation:   Recent Labs Lab 06/11/13 1137  LABPROT 15.5*  INR 1.26   Cardiac Enzymes: No results found for this basename: CKTOTAL, CKMB, CKMBINDEX, TROPONINI,  in the last 168 hours Urinalysis:   Recent Labs Lab 06/11/13 1304  COLORURINE YELLOW  LABSPEC 1.023  PHURINE 6.0  GLUCOSEU NEGATIVE  HGBUR NEGATIVE  BILIRUBINUR NEGATIVE  KETONESUR NEGATIVE  PROTEINUR NEGATIVE  UROBILINOGEN 0.2  NITRITE NEGATIVE  LEUKOCYTESUR NEGATIVE   Lipid Panel    Component Value Date/Time   CHOL 102  06/12/2013 0543   TRIG 90 06/12/2013 0543   HDL 42 06/12/2013 0543   CHOLHDL 2.4 06/12/2013 0543   VLDL 18 06/12/2013 0543   LDLCALC 42 06/12/2013 0543   HgbA1C  Lab Results  Component Value Date   HGBA1C 5.4 06/12/2013    Urine Drug Screen:     Component Value Date/Time   LABOPIA NONE DETECTED 06/11/2013 1304   COCAINSCRNUR NONE DETECTED 06/11/2013 1304   LABBENZ NONE DETECTED 06/11/2013 1304   AMPHETMU NONE DETECTED 06/11/2013 1304   THCU NONE DETECTED 06/11/2013 1304   LABBARB NONE DETECTED 06/11/2013 1304    Alcohol Level:   Recent Labs Lab 06/11/13 1137  ETH <11    Dg Chest 2 View 06/11/2013    Chronic lung changes with left basilar atelectasis. Minimal change from the previous examination.     Ct Head Wo Contrast 06/11/2013    Findings consistent with subacute infarct in the  left cerebral peduncle and pons. The appearance is not markedly changed since the most recent head CT. Negative for hemorrhage.    MRI / MRA of the brain  06/12/2013 1. 2 cm heterogeneous lesion involving the left upper pons/lower midbrain with surrounding edema. Findings may reflect a subacute infarct, however postcontrast imaging and follow-up MRI are recommended to exclude a mass.  2. Mild chronic small vessel ischemic disease.  3. Motion degraded MRA without evidence of new intracranial arterial occlusion. 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  2D Echocardiogram  ejection fraction 55-60%. No cardiac source of emboli identified.  Carotid Doppler  No evidence of hemodynamically significant internal carotid artery stenosis. Vertebral artery flow is antegrade.  EKG Atrial fibrillation. Ventricular response 85 beats per minute. For complete results please see formal report.   Therapy Recommendations SNF vs CIR. Apparently family is requesting SNF.  Physical Exam   Neurologic Examination:  Mental Status:  Alert, oriented, thought content appropriate. Speech slightly slurred without evidence of aphasia. Able to follow commands without difficulty.  Cranial Nerves:  II-Visual fields were normal.  III/IV/VI-Pupils were equal and reacted. Extraocular movements were full and conjugate.  V/VII-no facial numbness; mild right lower facial weakness.  VIII-normal.  X-slightly slurred speech; symmetrical palatal movement..  Motor: Moderately severe weakness proximally and moderate weakness distally involving right upper extremity; moderate proximal weakness of right lower extremity; normal strength of left extremities; normal muscle tone throughout.  Sensory: Normal throughout.  Deep Tendon Reflexes: Asymmetric with slightly greater responses elicited from right extremities compared to left extremities.  Plantars: Mute  bilaterally  Cerebellar: Normal finger-to-nose testing with use of right upper extremity.  Carotid auscultation: Normal  ASSESSMENT Gabriel Wood is a 80 y.o. male presenting with progressive right hemiparesis. TPA was not initiated secondary to recent stroke. Pons/lower mid brain mass lesion with surrounding edema. Radiographic findings not completely consistent with subacute stroke. Concern for possible underlying tumor/metastasis versus stroke. On aspirin 81 mg orally every day and Pradaxa prior to admission. Now on aspirin 81 mg orally every day and Pradaxa for secondary stroke prevention. Patient with resultant right hemiparesis. Stroke work up underway.   Atrial fibrillation - Pradaxa and aspirin 81 mg daily prior to admission  Cholesterol 102 LDL 42 - on Lipitor prior to admission and now  Coronary artery disease / CHF  Hypertension - mildly low blood pressures  History of left lower extremity DVT  Anemia  Large right shoulder melanoma removed [facility]a, Feb 2014  Hx LE DVT, pradaxa PTA   Hospital day # 3  TREATMENT/PLAN  Continue aspirin 81 mg orally every day and Pradaxa for secondary stroke prevention. It is not typical to give aspirin in addition to Pradaxa. We may consider altering meds pending test results.  MRI brain with contrast to exclude tumor  Continue statin at discharge  Disposition:  SNF   Burnetta Sabin, MSN, RN, ANVP-BC, ANP-BC, GNP-BC [facility] Pager: [phone] 06/14/2013 11:54 AM  I have personally obtained a history, examined the patient, evaluated imaging results, and formulated the assessment and plan of care. I agree with the above. Antony Contras, MD

## 2013-06-14 NOTE — Progress Notes (Signed)
Physical Therapy Treatment Patient Details Name: Gabriel Wood MRN: [id number] DOB: [birth year] Today's Date: 06/14/2013 Time: 8338-2505 PT Time Calculation (min): 28 min  PT Assessment / Plan / Recommendation  History of Present Illness Pt admit with left cerebral peduncle and pons infarct.     PT Comments   Patient is progressing well this session. Appears to be more safe with ambulation. Patient did require some rest breaks with ambulation this session. Patient planning to DC to [facility] nursing [facility] at discharge  Follow Up Recommendations  SNF;Supervision/Assistance - 24 hour     Does the patient have the potential to tolerate intense rehabilitation     Barriers to Discharge        Equipment Recommendations  None recommended by PT    Recommendations for Other Services    Frequency Min 3X/week   Progress towards PT Goals Progress towards PT goals: Progressing toward goals  Plan Current plan remains appropriate    Precautions / Restrictions Precautions Precautions: Fall   Pertinent Vitals/Pain no apparent distress     Mobility  Bed Mobility Overal bed mobility: Needs Assistance Bed Mobility: Supine to Sit Supine to sit: Min assist General bed mobility comments: A to support trunk. Cues for positioning Transfers Overall transfer level: Needs assistance Equipment used: Rolling walker (2 wheeled) Sit to Stand: Mod assist General transfer comment: Pt needs cues for hand placement and assist for anterior weight shift into standing Ambulation/Gait Ambulation/Gait assistance: Mod assist Ambulation Distance (Feet): 200 Feet Assistive device: Rolling walker (2 wheeled) Gait Pattern/deviations: Step-through pattern;Wide base of support;Decreased stride length Gait velocity interpretation: at or above normal speed for age/gender General Gait Details: Patient requires cues and assistance with Rw as he tends to walk around it instead of within it but appears to be doing  better with attending to task. Patient required 3 sitting rest breaks Modified Rankin (Stroke Patients Only) Pre-Morbid Rankin Score: Moderately severe disability Modified Rankin: Moderately severe disability    Exercises General Exercises - Lower Extremity Long Arc Quad: AROM;Both;15 reps Hip Flexion/Marching: AROM;Both;15 reps   PT Diagnosis:    PT Problem List:   PT Treatment Interventions:     PT Goals (current goals can now be found in the care plan section)    Visit Information  Last PT Received On: 06/14/13 Assistance Needed: +1 History of Present Illness: Pt admit with left cerebral peduncle and pons infarct.      Subjective Data      Cognition  Cognition Arousal/Alertness: Awake/alert Behavior During Therapy: WFL for tasks assessed/performed Overall Cognitive Status: Within Functional Limits for tasks assessed    Balance     End of Session PT - End of Session Equipment Utilized During Treatment: Gait belt;Oxygen Activity Tolerance: Patient tolerated treatment well Patient left: in chair;with call bell/phone within reach;with chair alarm set Nurse Communication: Mobility status   GP     Jacqualyn Posey 06/14/2013, 2:08 PM 06/14/2013 Jacqualyn Posey PTA [phone] pager [phone] office

## 2013-06-14 NOTE — Progress Notes (Signed)
PROGRESS NOTE  Gabriel Wood TIR:[id number] DOB: 07/25/1923 DOA: 06/11/2013 PCP: Simona Huh, MD  Assessment/Plan:  CVA (cerebral infarction)  -As discussed above, patient presenting with subacute CVA on aspirin and pradaxa -MRI/MRA:  2 cm heterogeneous lesion involving the left upper pons/lower midbrain with surrounding edema. Findings may reflect a subacute infarct, however postcontrast imaging and follow-up MRI are recommended to exclude a mass. MRI ordered with contrast-  carotid Dopplers: 1-39% ICA stenosis. Vertebral artery flow is antegrade Echo: normal EF, mild AS lipid profile: LDL 42  A1c: 5.4 -PT OT SLP consults  -Will continue Pradaxa and aspirin as per neuro recommendations  -neuro consult -PT- SNF -DYS diet  Atrial fibrillation  -Rate controlled, Continue metoprolol  -Continue Pradaxa and aspirin   CAD (coronary artery disease)  -continue outpatient medications   Hypertension  - Continue outpatient medications - with holding parameters  History of DVT of lower extremity  -Continue pradaxa as above   Hypokalemia  -Replace potassium      Code Status: full Family Communication:  Disposition Plan:    Consultants:  neuro  Procedures:    Antibiotics:    HPI/Subjective: No SOB, no CP No fever, no chills  Objective: Filed Vitals:   06/14/13 0935  BP: 109/67  Pulse: 89  Temp: 98.1 F (36.7 C)  Resp: 18    Intake/Output Summary (Last 24 hours) at 06/14/13 1355 Last data filed at 06/14/13 0852  Gross per 24 hour  Intake    120 ml  Output    200 ml  Net    -80 ml   Filed Weights   06/13/13 0530  Weight: 67.6 kg (149 lb 0.5 oz)    Exam:   General:  A+Ox3, NAD  Cardiovascular: irr  Respiratory: clear anterior  Abdomen: +Bs, soft  Musculoskeletal: moves all 4 ext   Data Reviewed: Basic Metabolic Panel:  Recent Labs Lab 06/11/13 1137 06/11/13 1150 06/12/13 0543  NA 141 140 142  K 3.5* 3.3* 4.4  CL 100 96  104  CO2 28  --  27  GLUCOSE 102* 102* 87  BUN 20 21 14   CREATININE 1.20 1.50* 0.98  CALCIUM 9.9  --  9.7   Liver Function Tests:  Recent Labs Lab 06/11/13 1137  AST 16  ALT 10  ALKPHOS 64  BILITOT 0.4  PROT 6.1  ALBUMIN 3.4*   No results found for this basename: LIPASE, AMYLASE,  in the last 168 hours No results found for this basename: AMMONIA,  in the last 168 hours CBC:  Recent Labs Lab 06/11/13 1137 06/11/13 1150 06/12/13 0543  WBC 8.2  --  6.5  NEUTROABS 6.1  --   --   HGB 11.0* 12.2* 10.6*  HCT 33.6* 36.0* 33.2*  MCV 81.4  --  82.0  PLT 202  --  194   Cardiac Enzymes: No results found for this basename: CKTOTAL, CKMB, CKMBINDEX, TROPONINI,  in the last 168 hours BNP (last 3 results)  Recent Labs  05/04/13 1930  PROBNP 604.2*   CBG:  Recent Labs Lab 06/11/13 1217  GLUCAP 85    No results found for this or any previous visit (from the past 240 hour(s)).   Studies: Mr Brain Wo Contrast  06/12/2013   CLINICAL DATA:  Right-sided weakness, right facial weakness, slurred speech. Stroke.  EXAM: MRI HEAD WITHOUT CONTRAST  MRA HEAD WITHOUT CONTRAST  TECHNIQUE: Multiplanar, multiecho pulse sequences of the brain and surrounding structures were obtained without intravenous contrast. Angiographic images of  the head were obtained using MRA technique without contrast.  COMPARISON:  Head CT 06/11/2013 and MRI/ MRA 07/30/2012  FINDINGS: MRI HEAD FINDINGS  There is a 2.1 x 1.9 cm focus of heterogeneous, predominantly hyperintense T2 signal centered in the left upper pons/lower midbrain. There is a thin rim of slightly increased diffusion weighted signal at its periphery which may demonstrate minimally restricted diffusion. There is facilitated diffusion in the central portion of the lesion. There is no intracranial hemorrhage. The left pons is mildly expanded, and there is T2/FLAIR hyperintensity throughout the pons and extending into the left middle cerebellar peduncle  and left greater than right cerebral peduncle is compatible with edema. There is a small amount of edema in the posterior limb of internal capsule on the left as well.  There is moderate cerebral atrophy. Small, scattered foci of T2 hyperintensity are present in the subcortical and deep cerebral white matter bilaterally, not significantly changed and compatible with mild chronic small vessel ischemic disease. There is no midline shift or extra-axial fluid collection. Orbits are unremarkable. Paranasal sinuses and mastoid air cells are clear.  MRA HEAD FINDINGS  Images are moderately degraded by motion. Flow is again not identified in the distal right vertebral artery, consistent with proximal occlusion. The visualized distal left vertebral artery is patent without high-grade stenosis. PICAs are not well visualized. Basilar artery is patent without evidence of significant stenosis. SCA origins are grossly patent. There is a fetal origin of the right PCA. PCAs are otherwise grossly unremarkable.  Internal carotid arteries are patent from skullbase to carotid termini. Bilateral carotid siphon irregularity is again seen. There is again evidence of high-grade stenosis in the anterior left cavernous ICA. The left A1 segment is again noted to be dominant with the azygos ACA anatomy. Relatively symmetric flow is identified within the MCAs with mild branch vessel irregularity bilaterally. No intracranial aneurysm is identified.  IMPRESSION: 1. 2 cm heterogeneous lesion involving the left upper pons/lower midbrain with surrounding edema. Findings may reflect a subacute infarct, however postcontrast imaging and follow-up MRI are recommended to exclude a mass. 2. Mild chronic small vessel ischemic disease. 3. Motion degraded MRA without evidence of new intracranial arterial occlusion. 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   Electronically Signed   By:  Logan Bores   On: 06/12/2013 14:37   Mr Jodene Nam Head/brain Wo Cm  06/12/2013   CLINICAL DATA:  Right-sided weakness, right facial weakness, slurred speech. Stroke.  EXAM: MRI HEAD WITHOUT CONTRAST  MRA HEAD WITHOUT CONTRAST  TECHNIQUE: Multiplanar, multiecho pulse sequences of the brain and surrounding structures were obtained without intravenous contrast. Angiographic images of the head were obtained using MRA technique without contrast.  COMPARISON:  Head CT 06/11/2013 and MRI/ MRA 07/30/2012  FINDINGS: MRI HEAD FINDINGS  There is a 2.1 x 1.9 cm focus of heterogeneous, predominantly hyperintense T2 signal centered in the left upper pons/lower midbrain. There is a thin rim of slightly increased diffusion weighted signal at its periphery which may demonstrate minimally restricted diffusion. There is facilitated diffusion in the central portion of the lesion. There is no intracranial hemorrhage. The left pons is mildly expanded, and there is T2/FLAIR hyperintensity throughout the pons and extending into the left middle cerebellar peduncle and left greater than right cerebral peduncle is compatible with edema. There is a small amount of edema in the posterior limb of internal capsule on the left as well.  There is moderate  cerebral atrophy. Small, scattered foci of T2 hyperintensity are present in the subcortical and deep cerebral white matter bilaterally, not significantly changed and compatible with mild chronic small vessel ischemic disease. There is no midline shift or extra-axial fluid collection. Orbits are unremarkable. Paranasal sinuses and mastoid air cells are clear.  MRA HEAD FINDINGS  Images are moderately degraded by motion. Flow is again not identified in the distal right vertebral artery, consistent with proximal occlusion. The visualized distal left vertebral artery is patent without high-grade stenosis. PICAs are not well visualized. Basilar artery is patent without evidence of significant stenosis.  SCA origins are grossly patent. There is a fetal origin of the right PCA. PCAs are otherwise grossly unremarkable.  Internal carotid arteries are patent from skullbase to carotid termini. Bilateral carotid siphon irregularity is again seen. There is again evidence of high-grade stenosis in the anterior left cavernous ICA. The left A1 segment is again noted to be dominant with the azygos ACA anatomy. Relatively symmetric flow is identified within the MCAs with mild branch vessel irregularity bilaterally. No intracranial aneurysm is identified.  IMPRESSION: 1. 2 cm heterogeneous lesion involving the left upper pons/lower midbrain with surrounding edema. Findings may reflect a subacute infarct, however postcontrast imaging and follow-up MRI are recommended to exclude a mass. 2. Mild chronic small vessel ischemic disease. 3. Motion degraded MRA without evidence of new intracranial arterial occlusion. 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   Electronically Signed   By: Logan Bores   On: 06/12/2013 14:37    Scheduled Meds: . aspirin EC  81 mg Oral Daily  . atorvastatin  40 mg Oral Daily  . budesonide-formoterol  2 puff Inhalation BID  . citalopram  10 mg Oral Daily  . dabigatran  150 mg Oral BID  . isosorbide mononitrate  60 mg Oral Daily  . meclizine  25 mg Oral Daily  . metoprolol succinate  25 mg Oral Daily  . multivitamin with minerals  1 tablet Oral QPM   Continuous Infusions:   Antibiotics Given (last 72 hours)   None      Active Problems:   Atrial fibrillation   CAD (coronary artery disease)   Hypertension   Hyperlipidemia   History of DVT of lower extremity   Stroke   CVA (cerebral infarction)    Time spent: 35 min    Nicolus Ose  [facility] Pager [phone] If 7PM-7AM, please contact night-coverage at [email or web address], password [facility] 06/14/2013, 1:55 PM  LOS: 3 days

## 2013-06-14 NOTE — Clinical Social Work Placement (Signed)
Clinical Social Work Department CLINICAL SOCIAL WORK PLACEMENT NOTE 06/14/2013  Patient:  Gabriel Wood, Gabriel Wood  Account Number:  [id number] Admit date:  06/11/2013  Clinical Social Worker:  Raquel Sarna [place], LCSWA  Date/time:  06/14/2013 12:41 PM  Clinical Social Work is seeking post-discharge placement for this patient at the following level of care:   [place]   (*CSW will update this form in Epic as items are completed)   06/14/2013  Patient/family provided with [place] Department of Clinical Social Works list of facilities offering this level of care within the geographic area requested by the patient (or if unable, by the patients family).  06/14/2013  Patient/family informed of their freedom to choose among providers that offer the needed level of care, that participate in Medicare, Medicaid or managed care program needed by the patient, have an available bed and are willing to accept the patient.  06/14/2013  Patient/family informed of [facility] ownership interest in [facility], as well as of the fact that they are under no obligation to receive care at this facility.  PASARR submitted to EDS on 06/14/2013 PASARR number received from EDS on   FL2 transmitted to all facilities in geographic area requested by pt/family on  06/14/2013 FL2 transmitted to all facilities within larger geographic area on   Patient informed that his/her managed care company has contracts with or will negotiate with  certain facilities, including the following:     Patient/family informed of bed offers received:   Patient chooses bed at  Physician recommends and patient chooses bed at    Patient to be transferred to  on   Patient to be transferred to facility by   The following physician request were entered in Epic:   Additional Comments:   Pati Gallo, [place] Worker [phone]

## 2013-06-15 ENCOUNTER — Inpatient Hospital Stay (HOSPITAL_COMMUNITY): Payer: Medicare Other

## 2013-06-15 DIAGNOSIS — Z86718 Personal history of other venous thrombosis and embolism: Secondary | ICD-10-CM

## 2013-06-15 MED ORDER — DIPHENHYDRAMINE HCL 12.5 MG/5ML PO ELIX
12.5000 mg | ORAL_SOLUTION | Freq: Once | ORAL | Status: AC
  Administered 2013-06-15: 12.5 mg via ORAL
  Filled 2013-06-15: qty 5

## 2013-06-15 MED ORDER — GADOBENATE DIMEGLUMINE 529 MG/ML IV SOLN
15.0000 mL | Freq: Once | INTRAVENOUS | Status: AC
  Administered 2013-06-15: 14 mL via INTRAVENOUS

## 2013-06-15 NOTE — Progress Notes (Signed)
Stroke Team Progress Note  HISTORY Gabriel Wood is an 80 y.o. male with a history of atrial fibrillation, hypertension, hyperlipidemia, TIA, congestive heart failure and COPD, presenting with progressive weakness involving right extremities over the past couple of weeks. Patient had a CT scan of his head 3 days prior to admission  which showed findings consistent with acute stroke involving left cerebral peduncle and pons. He had gotten progressively weaker to the point that he was unable to walk without assistance on the morning of admission and was unable to use his right upper extremity at all. Repeat CT scan 06/11/2013 showed no significant change from 06/08/2013. NIH stroke score was 7.  LSN: Unclear tPA Given: No: Unclear onset of acute stroke.   SUBJECTIVE Daughter at bedside. She had noted a slow progression of of right hemiparesis. She reports no acute stroke symptoms.  OBJECTIVE Most recent Vital Signs: Filed Vitals:   06/15/13 0112 06/15/13 0543 06/15/13 0910 06/15/13 0935  BP: 134/65 110/62 90/50   Pulse: 86 94 93   Temp: 97.9 F (36.6 C) 97.4 F (36.3 C) 97.5 F (36.4 C)   TempSrc: Oral Oral Oral   Resp: 18 18 18    Height:      Weight:      SpO2: 97% 91% 96% 97%   CBG (last 3)  No results found for this basename: GLUCAP,  in the last 72 hours  IV Fluid Intake:      MEDICATIONS  . aspirin EC  81 mg Oral Daily  . atorvastatin  40 mg Oral Daily  . budesonide-formoterol  2 puff Inhalation BID  . citalopram  10 mg Oral Daily  . dabigatran  150 mg Oral BID  . isosorbide mononitrate  60 mg Oral Daily  . meclizine  25 mg Oral Daily  . metoprolol succinate  25 mg Oral Daily  . multivitamin with minerals  1 tablet Oral QPM   PRN:  acetaminophen, acetaminophen, albuterol, furosemide, nitroGLYCERIN, promethazine, RESOURCE THICKENUP CLEAR, senna-docusate  Diet:  Dysphagia II nectar thick liquids Activity:  Up with assistance DVT Prophylaxis:  Pradaxa  CLINICALLY  SIGNIFICANT STUDIES Basic Metabolic Panel:   Recent Labs Lab 06/11/13 1137 06/11/13 1150 06/12/13 0543  NA 141 140 142  K 3.5* 3.3* 4.4  CL 100 96 104  CO2 28  --  27  GLUCOSE 102* 102* 87  BUN 20 21 14   CREATININE 1.20 1.50* 0.98  CALCIUM 9.9  --  9.7   Liver Function Tests:   Recent Labs Lab 06/11/13 1137  AST 16  ALT 10  ALKPHOS 64  BILITOT 0.4  PROT 6.1  ALBUMIN 3.4*   CBC:   Recent Labs Lab 06/11/13 1137 06/11/13 1150 06/12/13 0543  WBC 8.2  --  6.5  NEUTROABS 6.1  --   --   HGB 11.0* 12.2* 10.6*  HCT 33.6* 36.0* 33.2*  MCV 81.4  --  82.0  PLT 202  --  194   Coagulation:   Recent Labs Lab 06/11/13 1137  LABPROT 15.5*  INR 1.26   Cardiac Enzymes: No results found for this basename: CKTOTAL, CKMB, CKMBINDEX, TROPONINI,  in the last 168 hours Urinalysis:   Recent Labs Lab 06/11/13 1304  COLORURINE YELLOW  LABSPEC 1.023  PHURINE 6.0  GLUCOSEU NEGATIVE  HGBUR NEGATIVE  BILIRUBINUR NEGATIVE  KETONESUR NEGATIVE  PROTEINUR NEGATIVE  UROBILINOGEN 0.2  NITRITE NEGATIVE  LEUKOCYTESUR NEGATIVE   Lipid Panel    Component Value Date/Time   CHOL 102 06/12/2013 0543  TRIG 90 06/12/2013 0543   HDL 42 06/12/2013 0543   CHOLHDL 2.4 06/12/2013 0543   VLDL 18 06/12/2013 0543   LDLCALC 42 06/12/2013 0543   HgbA1C  Lab Results  Component Value Date   HGBA1C 5.4 06/12/2013    Urine Drug Screen:     Component Value Date/Time   LABOPIA NONE DETECTED 06/11/2013 1304   COCAINSCRNUR NONE DETECTED 06/11/2013 1304   LABBENZ NONE DETECTED 06/11/2013 1304   AMPHETMU NONE DETECTED 06/11/2013 1304   THCU NONE DETECTED 06/11/2013 1304   LABBARB NONE DETECTED 06/11/2013 1304    Alcohol Level:   Recent Labs Lab 06/11/13 1137  ETH <11    Dg Chest 2 View 06/11/2013    Chronic lung changes with left basilar atelectasis. Minimal change from the previous examination.     Ct Head Wo Contrast 06/11/2013    Findings consistent with subacute infarct in the left cerebral peduncle  and pons. The appearance is not markedly changed since the most recent head CT. Negative for hemorrhage.    MRI / MRA of the brain  06/12/2013 1. 2 cm heterogeneous lesion involving the left upper pons/lower midbrain with surrounding edema. Findings may reflect a subacute infarct, however postcontrast imaging and follow-up MRI are recommended to exclude a mass.  2. Mild chronic small vessel ischemic disease.  3. Motion degraded MRA without evidence of new intracranial arterial occlusion. 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  MRI with contrast 3/ /2015   2D Echocardiogram  ejection fraction 55-60%. No cardiac source of emboli identified.  Carotid Doppler  No evidence of hemodynamically significant internal carotid artery stenosis. Vertebral artery flow is antegrade.  EKG Atrial fibrillation. Ventricular response 85 beats per minute. For complete results please see formal report.   Therapy Recommendations SNF vs CIR. Apparently family is requesting SNF.  Physical Exam   Neurologic Examination:  Mental Status:  Alert, oriented, thought content appropriate. Speech slightly slurred without evidence of aphasia. Able to follow commands without difficulty.  Cranial Nerves:  II-Visual fields were normal.  III/IV/VI-Pupils were equal and reacted. Extraocular movements were full and conjugate.  V/VII-no facial numbness; mild right lower facial weakness.  VIII-normal.  X-slightly slurred speech; symmetrical palatal movement..  Motor: Moderately severe weakness proximally and moderate weakness distally involving right upper extremity; moderate proximal weakness of right lower extremity; normal strength of left extremities; normal muscle tone throughout.  Sensory: Normal throughout.  Deep Tendon Reflexes: Asymmetric with slightly greater responses elicited from right extremities compared to left extremities.  Plantars:  Mute bilaterally  Cerebellar: Normal finger-to-nose testing with use of right upper extremity.  Carotid auscultation: Normal  ASSESSMENT [name] is a 80 y.o. male presenting with progressive right hemiparesis. TPA was not initiated secondary to recent stroke. Pons/lower mid brain mass lesion with surrounding edema. Radiographic findings not completely consistent with subacute stroke. Concern for possible underlying tumor/metastasis versus stroke.  Needs MRI to confirm. On aspirin 81 mg orally every day and Pradaxa prior to admission. Now on aspirin 81 mg orally every day and Pradaxa for secondary stroke prevention. Patient with resultant right hemiparesis. Stroke work up underway.   Atrial fibrillation - Pradaxa and aspirin 81 mg daily prior to admission  Cholesterol 102 LDL 42 - on Lipitor prior to admission and now  Coronary artery disease / CHF  Hypertension - mildly low blood pressures  History of left lower extremity DVT  Anemia  Large right  shoulder melanoma removed Gaspar Cola, Feb 2014  Hx LE DVT, pradaxa PTA  Hospital day # 4  TREATMENT/PLAN  Recommend discontinuing aspirin. Would continue Pradaxa for secondary stroke prevention. It is not typical to give aspirin in addition to Pradaxa.   MRI brain with contrast to exclude tumor is pending prior to discharge. Neurosurgery consult if contrast MRI confirms mass  D/w Family and Dr Eliseo Squires  Disposition:  SNF placement   Burnetta Sabin, MSN, RN, ANVP-BC, ANP-BC, GNP-BC [facility] Pager: [phone] 06/15/2013 12:25 PM  I have personally obtained a history, examined the patient, evaluated imaging results, and formulated the assessment and plan of care. I agree with the above.  Antony Contras, MD

## 2013-06-15 NOTE — Progress Notes (Signed)
Patient scheduled for MRI today.  If cleared by MD- will plan d/c to [facility] today. Bed is available per Ebony Hail at [facility].  CSW spoke with patient's daughter Katharine Look. She is available to sign papers at facility but wants to talk to MD about MRI prior to going to facility. She has left a message for Ebony Hail, Admissions at [facility] to arrange to meet for admission paperwork. CSW will arrange for EMS transport once d/c has been confirmed.  Lorie Phenix. [place], [place]

## 2013-06-15 NOTE — Progress Notes (Signed)
Physical Therapy Treatment Patient Details Name: Gabriel Wood MRN: [id number] DOB: 03/03/1924 Today's Date: 06/15/2013 Time: 2025-4270 PT Time Calculation (min): 25 min  PT Assessment / Plan / Recommendation  History of Present Illness Pt admit with left cerebral peduncle and pons infarct.     PT Comments   Patient continues to be very motivated to work on ambulation. Patient was planning to DC today, however going for newer MRI. Plan is to go to [facility] on Wednesday. Daughter present this session   Follow Up Recommendations  SNF;Supervision/Assistance - 24 hour     Does the patient have the potential to tolerate intense rehabilitation     Barriers to Discharge        Equipment Recommendations  None recommended by PT    Recommendations for Other Services    Frequency Min 3X/week   Progress towards PT Goals Progress towards PT goals: Progressing toward goals  Plan Current plan remains appropriate    Precautions / Restrictions Precautions Precautions: Fall   Pertinent Vitals/Pain no apparent distress     Mobility  Bed Mobility Overal bed mobility: Needs Assistance Bed Mobility: Sit to Supine Supine to sit: Min guard Sit to supine: Min guard Transfers Equipment used: Rolling walker (2 wheeled) Sit to Stand: Min assist General transfer comment: Pt has to rock forward multiple times in order to stand. Cues for hand placement Ambulation/Gait Ambulation/Gait assistance: Min assist Ambulation Distance (Feet): 250 Feet Assistive device: Rolling walker (2 wheeled) Gait Pattern/deviations: Step-through pattern;Decreased stride length Gait velocity interpretation: at or above normal speed for age/gender General Gait Details: Patient requires to need several rest breaks. Cues to stand within RW.     Exercises     PT Diagnosis:    PT Problem List:   PT Treatment Interventions:     PT Goals (current goals can now be found in the care plan section)    Visit  Information  Last PT Received On: 06/15/13 Assistance Needed: +1 History of Present Illness: Pt admit with left cerebral peduncle and pons infarct.      Subjective Data      Cognition  Cognition Arousal/Alertness: Awake/alert Behavior During Therapy: WFL for tasks assessed/performed Overall Cognitive Status: Within Functional Limits for tasks assessed    Balance     End of Session PT - End of Session Equipment Utilized During Treatment: Gait belt;Oxygen Activity Tolerance: Patient tolerated treatment well Patient left: in chair;with call bell/phone within reach;with chair alarm set Nurse Communication: Mobility status   GP     Jacqualyn Posey 06/15/2013, 2:29 PM 06/15/2013 Jacqualyn Posey PTA [phone] pager [phone] office

## 2013-06-15 NOTE — Discharge Instructions (Signed)
Information on my medicine - Pradaxa (dabigatran)  This medication education was reviewed with me or my healthcare representative as part of my discharge preparation.  The pharmacist that spoke with me during my hospital stay was:  Saundra Shelling, [facility]  Why was Pradaxa prescribed for you? Pradaxa was prescribed for you to reduce the risk of forming blood clots that cause a stroke if you have a medical condition called atrial fibrillation (a type of irregular heartbeat).    What do you Need to know about PradAXa? Take your Pradaxa TWICE DAILY - one capsule in the morning and one tablet in the evening with or without food.  It would be best to take the doses about the same time each day.  The capsules should not be broken, chewed or opened - they must be swallowed whole.  Do not store Pradaxa in other medication containers - once the bottle is opened the Pradaxa should be used within FOUR months; throw away any capsules that havent been by that time.  Take Pradaxa exactly as prescribed by your doctor.  DO NOT stop taking Pradaxa without talking to the doctor who prescribed the medication.  Stopping without other stroke prevention medication to take the place of Pradaxa may increase your risk of developing a clot that causes a stroke.  Refill your prescription before you run out.  After discharge, you should have regular check-up appointments with your healthcare provider that is prescribing your Pradaxa.  In the future your dose may need to be changed if your kidney function or weight changes by a significant amount.  What do you do if you miss a dose? If you miss a dose, take it as soon as you remember on the same day.  If your next dose is less than 6 hours away, skip the missed dose.  Do not take two doses of PRADAXA at the same time.  Important Safety Information A possible side effect of Pradaxa is bleeding. You should call your healthcare provider right away if you experience any  of the following:   Bleeding from an injury or your nose that does not stop.   Unusual colored urine (red or dark brown) or unusual colored stools (red or black).   Unusual bruising for unknown reasons.   A serious fall or if you hit your head (even if there is no bleeding).  Some medicines may interact with Pradaxa and might increase your risk of bleeding or clotting while on Pradaxa. To help avoid this, consult your healthcare provider or pharmacist prior to using any new prescription or non-prescription medications, including herbals, vitamins, non-steroidal anti-inflammatory drugs (NSAIDs) and supplements.  This website has more information on Pradaxa (dabigatran): www.RuleEn[employer].cz.

## 2013-06-15 NOTE — Progress Notes (Signed)
PROGRESS NOTE  Gabriel Wood RCV:[id number] DOB: Jun 23, 1923 DOA: 06/11/2013 PCP: Simona Huh, MD  Gabriel Wood is a 80 y.o. male with past medical history significant for atrial fibrillation on chronic anticoagulation (aspirin and pradaxa), CAD, CHF, hypertension, TIA in the past and other medical problems as listed below who presents with above complaints.  About 2-3 weeks ago he first began having right-sided weakness which has progressively worsened to raise his unable to ambulate without assistance. He also states he's had some right facial weakness, slurred speech and drooling.He subsequently saw his PCP 3 days ago and a CT scan of his brain was ordered and it showed concern for left mid brain infarction. In the ED today CT showed findings consistent with subacute infarct in the left cerebral peduncle and pons.   Assessment/Plan:  CVA (cerebral infarction)  -As discussed above, patient presenting with subacute CVA on aspirin and pradaxa -MRI/MRA:  2 cm heterogeneous lesion involving the left upper pons/lower midbrain with surrounding edema. Findings may reflect a subacute infarct, however postcontrast imaging and follow-up MRI are recommended to exclude a mass. MRI ordered with contrast per Dr. Leonie Man- if shows tumor then neurosurgery consult  carotid Dopplers: 1-39% ICA stenosis. Vertebral artery flow is antegrade Echo: normal EF, mild AS lipid profile: LDL 42  A1c: 5.4 -PT OT SLP consults- SNF- patient has bed at [facility]  -Will continue Pradaxa and aspirin as per neuro recommendations  -neuro consult -DYS diet  Atrial fibrillation  -Rate controlled, Continue metoprolol  -Continue Pradaxa and aspirin   CAD (coronary artery disease)  -continue outpatient medications   Hypertension  - Continue outpatient medications - with holding parameters  History of DVT of lower extremity  -Continue pradaxa as above   Hypokalemia  -Replace potassium      Code Status:  full Family Communication:  Disposition Plan: SNF once MRI back   Consultants:  neuro  Procedures:    Antibiotics:    HPI/Subjective: No SOB, no CP No fever, no chills  Objective: Filed Vitals:   06/15/13 0910  BP: 90/50  Pulse: 93  Temp: 97.5 F (36.4 C)  Resp: 18    Intake/Output Summary (Last 24 hours) at 06/15/13 1309 Last data filed at 06/15/13 0900  Gross per 24 hour  Intake     60 ml  Output    300 ml  Net   -240 ml   Filed Weights   06/13/13 0530  Weight: 67.6 kg (149 lb 0.5 oz)    Exam:   General:  A+Ox3, NAD  Cardiovascular: irr  Respiratory: clear anterior  Abdomen: +Bs, soft  Musculoskeletal: moves all 4 ext   Data Reviewed: Basic Metabolic Panel:  Recent Labs Lab 06/11/13 1137 06/11/13 1150 06/12/13 0543  NA 141 140 142  K 3.5* 3.3* 4.4  CL 100 96 104  CO2 28  --  27  GLUCOSE 102* 102* 87  BUN 20 21 14   CREATININE 1.20 1.50* 0.98  CALCIUM 9.9  --  9.7   Liver Function Tests:  Recent Labs Lab 06/11/13 1137  AST 16  ALT 10  ALKPHOS 64  BILITOT 0.4  PROT 6.1  ALBUMIN 3.4*   No results found for this basename: LIPASE, AMYLASE,  in the last 168 hours No results found for this basename: AMMONIA,  in the last 168 hours CBC:  Recent Labs Lab 06/11/13 1137 06/11/13 1150 06/12/13 0543  WBC 8.2  --  6.5  NEUTROABS 6.1  --   --  HGB 11.0* 12.2* 10.6*  HCT 33.6* 36.0* 33.2*  MCV 81.4  --  82.0  PLT 202  --  194   Cardiac Enzymes: No results found for this basename: CKTOTAL, CKMB, CKMBINDEX, TROPONINI,  in the last 168 hours BNP (last 3 results)  Recent Labs  05/04/13 1930  PROBNP 604.2*   CBG:  Recent Labs Lab 06/11/13 1217  GLUCAP 85    No results found for this or any previous visit (from the past 240 hour(s)).   Studies: No results found.  Scheduled Meds: . aspirin EC  81 mg Oral Daily  . atorvastatin  40 mg Oral Daily  . budesonide-formoterol  2 puff Inhalation BID  . citalopram  10 mg  Oral Daily  . dabigatran  150 mg Oral BID  . isosorbide mononitrate  60 mg Oral Daily  . meclizine  25 mg Oral Daily  . metoprolol succinate  25 mg Oral Daily  . multivitamin with minerals  1 tablet Oral QPM   Continuous Infusions:   Antibiotics Given (last 72 hours)   None      Active Problems:   Atrial fibrillation   CAD (coronary artery disease)   Hypertension   Hyperlipidemia   History of DVT of lower extremity   Stroke   CVA (cerebral infarction)    Time spent: 35 min    Classie Weng  [facility] Pager [phone] If 7PM-7AM, please contact night-coverage at [email or web address], password [facility] 06/15/2013, 1:09 PM  LOS: 4 days

## 2013-06-16 ENCOUNTER — Encounter (HOSPITAL_COMMUNITY): Payer: Self-pay | Admitting: Radiology

## 2013-06-16 ENCOUNTER — Inpatient Hospital Stay (HOSPITAL_COMMUNITY): Payer: Medicare Other

## 2013-06-16 ENCOUNTER — Ambulatory Visit
Admit: 2013-06-16 | Discharge: 2013-06-16 | Disposition: A | Discharge status: Home / Self Care | Payer: Medicare Other | Attending: Radiation Oncology | Admitting: Radiation Oncology

## 2013-06-16 DIAGNOSIS — G939 Disorder of brain, unspecified: Secondary | ICD-10-CM

## 2013-06-16 DIAGNOSIS — G9389 Other specified disorders of brain: Secondary | ICD-10-CM | POA: Diagnosis present

## 2013-06-16 DIAGNOSIS — I2510 Atherosclerotic heart disease of native coronary artery without angina pectoris: Secondary | ICD-10-CM

## 2013-06-16 DIAGNOSIS — C717 Malignant neoplasm of brain stem: Secondary | ICD-10-CM

## 2013-06-16 LAB — BASIC METABOLIC PANEL
BUN: 11 mg/dL (ref 6–23)
CO2: 28 mEq/L (ref 19–32)
Calcium: 9.6 mg/dL (ref 8.4–10.5)
Chloride: 101 mEq/L (ref 96–112)
Creatinine, Ser: 0.89 mg/dL (ref 0.50–1.35)
GFR calc Af Amer: 87 mL/min — ABNORMAL LOW (ref >90)
GFR, EST NON AFRICAN AMERICAN: 75 mL/min — AB (ref >90)
Glucose, Bld: 82 mg/dL (ref 70–99)
Potassium: 3.9 mEq/L (ref 3.7–5.3)
Sodium: 139 mEq/L (ref 137–147)

## 2013-06-16 LAB — CBC
HEMATOCRIT: 34.7 % — AB (ref 39.0–52.0)
HEMOGLOBIN: 11 g/dL — AB (ref 13.0–17.0)
MCH: 25.6 pg — ABNORMAL LOW (ref 26.0–34.0)
MCHC: 31.7 g/dL (ref 30.0–36.0)
MCV: 80.9 fL (ref 78.0–100.0)
Platelets: 190 10*3/uL (ref 150–400)
RBC: 4.29 MIL/uL (ref 4.22–5.81)
RDW: 16.3 % — ABNORMAL HIGH (ref 11.5–15.5)
WBC: 6.2 10*3/uL (ref 4.0–10.5)

## 2013-06-16 MED ORDER — DEXAMETHASONE SODIUM PHOSPHATE 4 MG/ML IJ SOLN
4.0000 mg | Freq: Two times a day (BID) | INTRAMUSCULAR | Status: DC
  Administered 2013-06-16 – 2013-06-17 (×3): 4 mg via INTRAVENOUS
  Filled 2013-06-16 (×5): qty 1

## 2013-06-16 MED ORDER — IOHEXOL 300 MG/ML  SOLN
100.0000 mL | Freq: Once | INTRAMUSCULAR | Status: AC | PRN
  Administered 2013-06-16: 100 mL via INTRAVENOUS

## 2013-06-16 MED ORDER — DEXAMETHASONE SODIUM PHOSPHATE 4 MG/ML IJ SOLN
4.0000 mg | Freq: Four times a day (QID) | INTRAMUSCULAR | Status: DC
  Administered 2013-06-16: 4 mg via INTRAVENOUS

## 2013-06-16 MED ORDER — IOHEXOL 300 MG/ML  SOLN
25.0000 mL | INTRAMUSCULAR | Status: AC
  Administered 2013-06-16 (×2): 25 mL via ORAL

## 2013-06-16 MED ORDER — ASPIRIN EC 81 MG PO TBEC
81.0000 mg | DELAYED_RELEASE_TABLET | Freq: Every day | ORAL | Status: DC
  Administered 2013-06-17: 81 mg via ORAL
  Filled 2013-06-16 (×3): qty 1

## 2013-06-16 NOTE — Progress Notes (Signed)
OT Cancellation Note  Patient Details Name: Gabriel Wood MRN: [id number] DOB: 03-05-1924   Cancelled Treatment:    Reason Eval/Treat Not Completed: Patient at procedure or test/ unavailable. Will check back tomorrow.   Lucille Passy M [phone] 06/16/2013, 4:59 PM

## 2013-06-16 NOTE — Progress Notes (Signed)
Speech Language Pathology Treatment: Dysphagia  Patient Details Name: Gabriel Wood MRN: [id number] DOB: 03/15/1924 Today's Date: 06/16/2013 Time: 2706-2376 SLP Time Calculation (min): 15 min  Assessment / Plan / Recommendation Clinical Impression  Pt seen to assess tolerance of po diet (dys3/nectar) - found with pitcher full of thin water with large straw on table which pt/daughter states pt has been consuming without difficulties.  SLP observed pt consume 5 boluses of thin water with post-swallow cough after 3/5.  Pt verbalizes that he it is due to "secretions on voice cords" that he needs to clear.  Pt dislikes taste of thickened drinks (reports he doesn't mind cranberry nor apple) and has been consuming thin water throughout weekend/week per pt with apparent tolerance given all vitals stable.    Options reviewed include:    1. Advance diet to allow all thin liquids even with possible increased aspiration risk especially given h/o premorbid cough with liquids OR 2. Continue nectar liquids except thin water between meals after mouth care (water protocol)  Do not recommend MBS as anticipate pt will not comply with very strict modifications regardless given current intake.  Defer to MD to decide plan of care.  Regardless would recommend monitor swallow ability closely due to medical dx resulting in possible/probable changes in swallowing ability.     HPI HPI: 80 yo male adm with right sided weakness, facial weakness, slurred speech and drooling.  Pt with h/o CHF, CAD, HTN, afib.  Imaging found subacute left cerebral peduncle and pons subacute cva.  CXR showed chronic lung changes, bilateral atx.  BSE ordered as pt failed RN stroke swallow screen. Pt is dysarthric and will benefit from speech evaluation as well.  Daughter and pt report pt with some chronic right side weakness and speech deficits.  BSE completed with recommendations for modified diet and SLE indicated.     Pertinent Vitals  Afebrile, decreased  SLP Plan  Continue with current plan of care    Recommendations Diet recommendations: Dysphagia 3 (mechanical soft);Nectar-thick liquid;Thin liquid Liquids provided via: Cup;Straw Medication Administration: Whole meds with puree Supervision: Patient able to self feed;Intermittent supervision to cue for compensatory strategies Compensations: Slow rate;Small sips/bites Postural Changes and/or Swallow Maneuvers: Seated upright 90 degrees;Upright 30-60 min after meal;Chin tuck              Oral Care Recommendations: Oral care BID Follow up Recommendations: Skilled Nursing facility Plan: Continue with current plan of care    [place], [employer] [facility] SLP [phone]

## 2013-06-16 NOTE — Progress Notes (Signed)
Stroke Team Progress Note  HISTORY Gabriel Wood is an 80 y.o. male with a history of atrial fibrillation, hypertension, hyperlipidemia, TIA, congestive heart failure and COPD, presenting with progressive weakness involving right extremities over the past couple of weeks. Patient had a CT scan of his head 3 days prior to admission  which showed findings consistent with acute stroke involving left cerebral peduncle and pons. He had gotten progressively weaker to the point that he was unable to walk without assistance on the morning of admission and was unable to use his right upper extremity at all. Repeat CT scan 06/11/2013 showed no significant change from 06/08/2013. NIH stroke score was 7.  LSN: Unclear tPA Given: No: Unclear onset of acute stroke.   SUBJECTIVE Daughter at bedside. She plans to be here most of the day.  OBJECTIVE Most recent Vital Signs: Filed Vitals:   06/15/13 2111 06/16/13 0236 06/16/13 0630 06/16/13 0922  BP:  116/57 113/64 126/68  Pulse:  86 82 84  Temp:  97.9 F (36.6 C) 98.3 F (36.8 C) 97.9 F (36.6 C)  TempSrc:  Oral Oral Oral  Resp:  18 18 18   Height:      Weight:      SpO2: 95% 90% 90% 90%   CBG (last 3)  No results found for this basename: GLUCAP,  in the last 72 hours  IV Fluid Intake:      MEDICATIONS  . aspirin EC  81 mg Oral Daily  . atorvastatin  40 mg Oral Daily  . budesonide-formoterol  2 puff Inhalation BID  . citalopram  10 mg Oral Daily  . dabigatran  150 mg Oral BID  . dexamethasone  4 mg Intravenous 4 times per day  . isosorbide mononitrate  60 mg Oral Daily  . meclizine  25 mg Oral Daily  . metoprolol succinate  25 mg Oral Daily  . multivitamin with minerals  1 tablet Oral QPM   PRN:  acetaminophen, acetaminophen, albuterol, furosemide, nitroGLYCERIN, promethazine, RESOURCE THICKENUP CLEAR, senna-docusate  Diet:  Dysphagia II nectar thick liquids Activity:  Up with assistance DVT Prophylaxis:  Pradaxa  CLINICALLY  SIGNIFICANT STUDIES Basic Metabolic Panel:   Recent Labs Lab 06/12/13 0543 06/16/13 0641  NA 142 139  K 4.4 3.9  CL 104 101  CO2 27 28  GLUCOSE 87 82  BUN 14 11  CREATININE 0.98 0.89  CALCIUM 9.7 9.6   Liver Function Tests:   Recent Labs Lab 06/11/13 1137  AST 16  ALT 10  ALKPHOS 64  BILITOT 0.4  PROT 6.1  ALBUMIN 3.4*   CBC:   Recent Labs Lab 06/11/13 1137  06/12/13 0543 06/16/13 0641  WBC 8.2  --  6.5 6.2  NEUTROABS 6.1  --   --   --   HGB 11.0*  < > 10.6* 11.0*  HCT 33.6*  < > 33.2* 34.7*  MCV 81.4  --  82.0 80.9  PLT 202  --  194 190  < > = values in this interval not displayed. Coagulation:   Recent Labs Lab 06/11/13 1137  LABPROT 15.5*  INR 1.26   Cardiac Enzymes: No results found for this basename: CKTOTAL, CKMB, CKMBINDEX, TROPONINI,  in the last 168 hours Urinalysis:   Recent Labs Lab 06/11/13 1304  COLORURINE YELLOW  LABSPEC 1.023  PHURINE 6.0  GLUCOSEU NEGATIVE  HGBUR NEGATIVE  BILIRUBINUR NEGATIVE  KETONESUR NEGATIVE  PROTEINUR NEGATIVE  UROBILINOGEN 0.2  NITRITE NEGATIVE  LEUKOCYTESUR NEGATIVE   Lipid Panel  Component Value Date/Time   CHOL 102 06/12/2013 0543   TRIG 90 06/12/2013 0543   HDL 42 06/12/2013 0543   CHOLHDL 2.4 06/12/2013 0543   VLDL 18 06/12/2013 0543   LDLCALC 42 06/12/2013 0543   HgbA1C  Lab Results  Component Value Date   HGBA1C 5.4 06/12/2013    Urine Drug Screen:     Component Value Date/Time   LABOPIA NONE DETECTED 06/11/2013 1304   COCAINSCRNUR NONE DETECTED 06/11/2013 1304   LABBENZ NONE DETECTED 06/11/2013 1304   AMPHETMU NONE DETECTED 06/11/2013 1304   THCU NONE DETECTED 06/11/2013 1304   LABBARB NONE DETECTED 06/11/2013 1304    Alcohol Level:   Recent Labs Lab 06/11/13 1137  ETH <11    Dg Chest 2 View 06/11/2013    Chronic lung changes with left basilar atelectasis. Minimal change from the previous examination.     Ct Head Wo Contrast 06/11/2013    Findings consistent with subacute infarct in the left  cerebral peduncle and pons. The appearance is not markedly changed since the most recent head CT. Negative for hemorrhage.    MRI / MRA of the brain  06/12/2013 1. 2 cm heterogeneous lesion involving the left upper pons/lower midbrain with surrounding edema. Findings may reflect a subacute infarct, however postcontrast imaging and follow-up MRI are recommended to exclude a mass.  2. Mild chronic small vessel ischemic disease.  3. Motion degraded MRA without evidence of new intracranial arterial occlusion. 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  MRI with contrast 3/10 /2015 1. 2.1 x 2.1 x 1.7 cm peripherally enhancing mass lesion. 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 Metastatic disease is also considered. 2. Atrophy and white matter disease is again noted.   2D Echocardiogram  ejection fraction 55-60%. No cardiac source of emboli identified.  Carotid Doppler  No evidence of hemodynamically significant internal carotid artery stenosis. Vertebral artery flow is antegrade.  EKG Atrial fibrillation. Ventricular response 85 beats per minute. For complete results please see formal report.   Therapy Recommendations SNF vs CIR. Apparently family is requesting SNF.  Physical Exam   Neurologic Examination:  Mental Status:  Alert, oriented, thought content appropriate. Speech slightly slurred without evidence of aphasia. Able to follow commands without difficulty.  Cranial Nerves:  II-Visual fields were normal.  III/IV/VI-Pupils were equal and reacted. Extraocular movements were full and conjugate.  V/VII-no facial numbness; mild right lower facial weakness.  VIII-normal.  X-slightly slurred speech; symmetrical palatal movement..  Motor: Moderately severe weakness proximally and moderate weakness distally involving right upper extremity; moderate proximal  weakness of right lower extremity; normal strength of left extremities; normal muscle tone throughout.  Sensory: Normal throughout.  Deep Tendon Reflexes: Asymmetric with slightly greater responses elicited from right extremities compared to left extremities.  Plantars: Mute bilaterally  Cerebellar: Normal finger-to-nose testing with use of right upper extremity.  Carotid auscultation: Normal  ASSESSMENT [name] is a 80 y.o. male presenting with progressive right hemiparesis. TPA was not initiated secondary to recent stroke. Pons/lower mid brain mass lesion with surrounding edema. Radiographic findings not completely consistent with subacute stroke. 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 On aspirin 81 mg orally every day and Pradaxa prior to admission. Now on aspirin 81 mg orally every day and Pradaxa for secondary stroke prevention. Patient with resultant right hemiparesis. Neurosurgeon will be consulted.   Atrial  fibrillation - Pradaxa and aspirin 81 mg daily prior to admission  Cholesterol 102 LDL 42 - on Lipitor prior to admission and now  Coronary artery disease / CHF  Hypertension - mildly low blood pressures  History of left lower extremity DVT  Anemia  Large right shoulder melanoma removed [facility]a, Feb 2014  Hx LE DVT, pradaxa PTA  Hospital day # 5  TREATMENT/PLAN  Continue aspirin for now per Dr. Leonie Man via Sherwood Gambler. Will discontinue  Pradaxa given newly identified tumor.  D/w Family at bedside and Dr Grandville Silos  Neurosurgeon consulted. Dr. Leonie Man discussed with Dr. Sherwood Gambler over the phone.  Disposition:  SNF placement  Stroke team will signoff. Call for questions.   Burnetta Sabin, MSN, RN, ANVP-BC, ANP-BC, [facility] Pager: [phone] 06/16/2013 11:12 AM  I have personally obtained a history, examined the patient, evaluated imaging results, and formulated the assessment and plan of care. I agree  with the above.   Antony Contras, MD

## 2013-06-16 NOTE — Evaluation (Signed)
Speech Language Pathology Evaluation Patient Details Name: Gabriel Wood MRN: [id number] DOB: 1923-11-25 Today's Date: 06/16/2013 Time: 5638-7564 SLP Time Calculation (min): 9 min  Problem List:  Patient Active Problem List   Diagnosis Date Noted  . Stroke 06/11/2013  . CVA (cerebral infarction) 06/11/2013  . Melanoma of neck 08/06/2012  . Unstable angina 08/06/2012  . TIA (transient ischemic attack) 07/30/2012  . Atrial fibrillation 07/30/2012  . CAD (coronary artery disease)   . Chronic anticoagulation   . Hypertension   . Hyperlipidemia   . History of DVT of lower extremity   . Abdominal aneurysm without mention of rupture 03/16/2012   Past Medical History:  Past Medical History  Diagnosis Date  . History of colon polyps   . Anemia   . Chronic kidney disease     History of Kidney Stones  . Ulcer     history of Peptic Ulcer Disease  . IBS (irritable bowel syndrome)   . Anxiety   . BPH (benign prostatic hypertrophy)   . CAD (coronary artery disease)   . Atrial fibrillation   . AAA (abdominal aortic aneurysm)   . Arthritis   . Chronic anticoagulation     On Coumadin therapy ( A Fib)  . Hypertension   . Hyperlipidemia   . History of DVT of lower extremity   . Cancer Sept. 2013    Melanoma,Right  posterior shoulder  . Anginal pain   . Shortness of breath   . Transient ischemic attack (TIA) 07/2012  . Fall at home Dec. 2014  . CHF (congestive heart failure)   . COPD (chronic obstructive pulmonary disease)    Past Surgical History:  Past Surgical History  Procedure Laterality Date  . Appendectomy    . Cholecystectomy    . Tonsillectomy    . Rotator cuff repair      Right shoulder  . Coronary artery bypass graft    . Appendectomy    . Fracture surgery  2011    Left tibial ORIF by Dr. Sharol Given  . Ankle fusion  2011    Left ankle  . Lymph node biopsy    . Joint replacement Left Dec.  2014    Left shoulder replacement  . Kidney stones     HPI:  80 yo  male adm with right sided weakness, facial weakness, slurred speech and drooling.  Pt with h/o CHF, CAD, HTN, afib.  Imaging found subacute left cerebral peduncle and pons subacute cva.  CXR showed chronic lung changes, bilateral atx.  BSE ordered as pt failed RN stroke swallow screen. Pt is dysarthric and will benefit from speech evaluation as well.  Daughter and pt report pt with some chronic right side weakness and speech deficits.  BSE completed with recommendations for modified diet and SLE indicated.     Assessment / Plan / Recommendation Clinical Impression  Pt presents with intact cognitive linguistic skills for current environment, AxO x4, receptive and expressive language is fluent and pt demonstrates good awareness to physical changes with neuro event.  He does have mild dysarthria impacting articulation abilities but is amenable to compensation strategies.  SLP educated pt and daughter Katharine Look to recommendations for follow up speech pathology services at rehab for dysarthria and dysphagia management.  Pt agreeable to plan.  Using return demonstration, SLP provided compensation strategies to pt.      SLP Assessment  Patient needs continued Speech Lanaguage Pathology Services    Follow Up Recommendations  Skilled Nursing facility  Frequency and Duration min 1 x/week  1 week   Pertinent Vitals/Pain Afebrile, decreased   SLP Goals  SLP Goals Potential to Achieve Goals: Good Potential Considerations: Medical prognosis  SLP Evaluation Prior Functioning  Type of Home: House Available Help at Discharge: Family;Available 24 hours/day   Cognition  Overall Cognitive Status: Within Functional Limits for tasks assessed Arousal/Alertness: Awake/alert Orientation Level: Oriented X4 Attention: Sustained;Selective Sustained Attention: Appears intact Selective Attention: Appears intact Memory:  (suspect age related memory loss, eg:  self correct daughters current home state) Awareness:  Appears intact (to physical, speech changes) Problem Solving: Appears intact Safety/Judgment: Appears intact    Comprehension  Auditory Comprehension Overall Auditory Comprehension: Appears within functional limits for tasks assessed Yes/No Questions: Not tested Commands: Within Functional Limits Conversation: Complex Interfering Components: Hearing Visual Recognition/Discrimination Discrimination: Not tested Reading Comprehension Reading Status: Within funtional limits (for clock on wall)    Expression Expression Primary Mode of Expression: Verbal Verbal Expression Overall Verbal Expression: Appears within functional limits for tasks assessed Initiation: No impairment Level of Generative/Spontaneous Verbalization: Conversation Repetition: No impairment Naming: Not tested Pragmatics: No impairment Written Expression Dominant Hand: Right Written Expression: Not tested   Oral / Motor Oral Motor/Sensory Function Overall Oral Motor/Sensory Function: Impaired (pt states deficits are exacerbation of baseline) Labial ROM: Reduced right Labial Symmetry: Abnormal symmetry right Labial Strength: Reduced Lingual Strength: Reduced Facial ROM: Reduced right Facial Symmetry: Right droop;Right drooping eyelid Facial Strength: Reduced Facial Sensation: Reduced Velum: Within Functional Limits Mandible: Within Functional Limits Motor Speech Overall Motor Speech: Impaired Respiration: Within functional limits Phonation: Normal Resonance: Within functional limits Articulation: Impaired Level of Impairment: Word (multisyllabic word level) Intelligibility: Intelligibility reduced Word: 75-100% accurate Phrase: 75-100% accurate Sentence: 50-74% accurate Conversation: 50-74% accurate Motor Planning: Witnin functional limits Motor Speech Errors: Not applicable Interfering Components: Premorbid status;Hearing loss Effective Techniques: Over-articulate;Slow rate   [place], [place] [facility] SLP [phone]

## 2013-06-16 NOTE — Consult Note (Signed)
Reason for Consult:  Brain tumor Referring Physician:  Irine Seal, MD  Gabriel Wood is an 80 y.o. right-handed male.  HPI: Patient is an elderly male who is admitted 5 days ago with what was suspected to be a stroke syndrome. He explains that he had a 2 to three-week history of progressively worsening right-sided weakness. The weakness is worst in the right upper extremity, but also present in the right lower extremity and the right side of the face.  Patient was admitted to the triad hospitalist service and has been evaluated by the stroke neurology service. An extensive stroke workup was performed, and finally an MRI was done yesterday and revealed an enhancing mass lesion in the left midbrain, and neurosurgical consultation was requested by Dr. Grandville Silos.   The patient notes diplopia with left lateral gaze for the past 2-3 weeks, and some rare mild nausea. However he denies any headache, vomiting, or seizures.  Past history is notable for having had a melanoma excised from his right shoulder at [facility] by Dr. Andi Devon, of the surgical oncology section, in February of 2014. He also had some lymph nodes excised, and subsequently had another lesion from there the base of his neck excised a couple months later. Eventually he was treated at [facility] with radiation therapy in June of 2014, to the area of the right shoulder to the base of this neck. Receiving treatments 5 days a week for 4 weeks.  Neither he nor his family believe that he has consulted with a medical oncologist.  No metastatic workup has been performed.  Past Medical History:  Past Medical History  Diagnosis Date  . History of colon polyps   . Anemia   . Chronic kidney disease     History of Kidney Stones  . Ulcer     history of Peptic Ulcer Disease  . IBS (irritable bowel syndrome)   . Anxiety   . BPH (benign prostatic hypertrophy)   . CAD (coronary artery disease)   . Atrial fibrillation   . AAA  (abdominal aortic aneurysm)   . Arthritis   . Chronic anticoagulation     On Coumadin therapy ( A Fib)  . Hypertension   . Hyperlipidemia   . History of DVT of lower extremity   . Cancer Sept. 2013    Melanoma,Right  posterior shoulder  . Anginal pain   . Shortness of breath   . Transient ischemic attack (TIA) 07/2012  . Fall at home Dec. 2014  . CHF (congestive heart failure)   . COPD (chronic obstructive pulmonary disease)     Past Surgical History:  Past Surgical History  Procedure Laterality Date  . Appendectomy    . Cholecystectomy    . Tonsillectomy    . Rotator cuff repair      Right shoulder  . Coronary artery bypass graft    . Appendectomy    . Fracture surgery  2011    Left tibial ORIF by Dr. Sharol Given  . Ankle fusion  2011    Left ankle  . Lymph node biopsy    . Joint replacement Left Dec.  2014    Left shoulder replacement  . Kidney stones      Family History:  Family History  Problem Relation Age of Onset  . Cancer Mother     stomach cancer  . Heart disease Brother   . Heart disease Brother   . Heart disease Brother     Social  History:  reports that he quit smoking about 17 years ago. He has never used smokeless tobacco. He reports that he does not drink alcohol or use illicit drugs.  Allergies:  Allergies  Allergen Reactions  . Morphine And Related Palpitations  . Codeine Nausea Only    Medications: I have reviewed the patient's current medications.  ROS:  Notable for those difficulties described in his history of present illness, and past medical history, but is otherwise unremarkable.  Physical Examination: Patient well-developed well-nourished elderly male, in no acute distress. Blood pressure 126/68, pulse 84, temperature 97.9 F (36.6 C), temperature source Oral, resp. rate 18, height 5' 3"  (1.6 m), weight 67.6 kg (149 lb 0.5 oz), SpO2 90.00%. Lungs:  Clear to auscultation, symmetrical respiratory excursion. Heart:  Irregular rhythm, no  murmur. Abdomen:  Soft, nondistended, bowel sounds present. Extremity:  No clubbing, cyanosis, or edema.  Neurological Examination: Mental Status Examination:  Patient is awake, alert, oriented to his name, [facility] hospital, and March 2015. Speech is fluent. He has good comprehension. He follows commands. Cranial Nerve Examination:  Pupils equal, round, reactive to light. EOMI. Gaze appears conjugate. Right facial weakness. Hearing present bilaterally. Palatal movement symmetrical. Shoulder shrug symmetrical. Tongue is midline. Motor Examination:  Right hemiparesis, right upper extremity weaker than right lower extremity. Sensory Examination:  Intact to pinprick. Reflex Examination:   Symmetrical, toes are equivocal. Gait and Stance Examination:  Mild unsteadiness, favoring the right lower extremity.   Results for orders placed during the hospital encounter of 06/11/13 (from the past 48 hour(s))  CBC     Status: Abnormal   Collection Time    06/16/13  6:41 AM      Result Value Ref Range   WBC 6.2  4.0 - 10.5 K/uL   RBC 4.29  4.22 - 5.81 MIL/uL   Hemoglobin 11.0 (*) 13.0 - 17.0 g/dL   HCT 34.7 (*) 39.0 - 52.0 %   MCV 80.9  78.0 - 100.0 fL   MCH 25.6 (*) 26.0 - 34.0 pg   MCHC 31.7  30.0 - 36.0 g/dL   RDW 16.3 (*) 11.5 - 15.5 %   Platelets 190  150 - 400 K/uL  BASIC METABOLIC PANEL     Status: Abnormal   Collection Time    06/16/13  6:41 AM      Result Value Ref Range   Sodium 139  137 - 147 mEq/L   Potassium 3.9  3.7 - 5.3 mEq/L   Chloride 101  96 - 112 mEq/L   CO2 28  19 - 32 mEq/L   Glucose, Bld 82  70 - 99 mg/dL   BUN 11  6 - 23 mg/dL   Creatinine, Ser 0.89  0.50 - 1.35 mg/dL   Calcium 9.6  8.4 - 10.5 mg/dL   GFR calc non Af Amer 75 (*) >90 mL/min   GFR calc Af Amer 87 (*) >90 mL/min   Comment: (NOTE)     The eGFR has been calculated using the CKD EPI equation.     This calculation has not been validated in all clinical situations.     eGFR's persistently <90 mL/min  signify possible Chronic Kidney     Disease.    [name] Contrast  06/15/2013   CLINICAL DATA Brainstem mass.  EXAM MRI HEAD WITH CONTRAST  TECHNIQUE Multiplanar, multiecho pulse sequences of the brain and surrounding structures were obtained with intravenous contrast.  COMPARISON Noncontrast MRI brain 06/12/2013  CONTRAST 71m MULTIHANCE GADOBENATE  DIMEGLUMINE 529 MG/ML IV SOLN  FINDINGS The lesion in the left midbrain demonstrates predominantly peripheral enhancement measuring 2.1 x 2.1 x 1.7 cm. Surrounding vasogenic edema is again seen. The edema extends superiorly in the left cerebral peduncle and along white matter tracts. There is also edema extending inferiorly in the brainstem and left cerebellar peduncle. The enhancement characteristics are most typical of a mass. 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 A focal metastasis is also considered. This does not have the appearance of an infarct. Scattered white matter changes are again noted bilaterally.  IMPRESSION 1. 2.1 x 2.1 x 1.7 cm peripherally enhancing mass lesion. 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 Metastatic disease is also considered. 2. Atrophy and white matter disease is again noted.  SIGNATURE  Electronically Signed   By: Lawrence Santiago M.D.   On: 06/15/2013 15:54     Assessment/Plan: Patient presents with a 2-3 week history of progressively worsening right-sided weakness and left gaze diplopia, and found to have an enhancing mass lesion in the left ventral brainstem. Patient has a notable history of melanoma, treated at [facility] last year. Neurosurgical consultation was requested for further recommendations.  I have reviewed the MRI with Dr. Rolla Flatten from the neuroradiology section, and he does feel that the mass lesion certainly could represent a metastatic lesion, although he notes that there is no  evidence of melanin or hemorrhage within the lesion. He feels that if it is a metastasis from the patient's melanoma, it would be amelanotic, which certainly can occur.  I spoke with the patient, his daughter Gus Height, and is son and daughter-in-law Dominica Severin and Raylyn Speckman, at length, and reviewed his MRI with him at his bedside.  I've explained that the brainstem mass lesion is not resectable, but is potentially treatable with stereotactic radiosurgery. I further explained that we need to assess his overall metastatic disease status. The patient and his family are in agreement with proceeding with further workup. With this in mind I've requested 3T SRS protocol MRI of the brain with gadolinium.  I discussed the case with Dr. Irine Seal, and requested that he obtain a CT of the chest/abdomen/pelvis from assess systemic metastatic status. I've also discussed with Dr. Grandville Silos as well as with Dr. Antony Contras that in light of the brain mass I would favor discontinuation of both Pradaxa and aspirin to reduce the risk of catastrophic intracranial hemorrhage.  I've contacted the [facility] radiation oncology department, and discussed the case with Dr. Kyung Rudd and Ms. Mont Dutton, the [facility] navigator. I've requested consultation from Dr. Lisbeth Renshaw, and we have tentatively scheduled the patient for treatment next week, pending the results of the 3T SRS protocol MRI and CT of the chest/abdomen/pelvis. I have also requested medical oncology consultation from Dr. Marcy Panning, also from the [facility] medical oncology department, who will see the patient.  Hosie Spangle, MD 06/16/2013, 2:08 PM

## 2013-06-16 NOTE — Progress Notes (Signed)
[facility] PROGRESS NOTE  Gabriel Wood OQH:[id number] DOB: 1924-01-21 DOA: 06/11/2013 PCP: Simona Huh, MD  Assessment/Plan: #1 brain mass Probable likely etiology of patient's symptoms. Patient does have a prior history of melanoma question as to whether this is metastatic disease. Will check a CT of the chest, CT of the abdomen and pelvis to rule out metastatic disease. Patient has been seen by neurosurgery who awoke on that oncology and radiation oncology for further evaluation. Will discontinue Pradaxa and aspirin per neurosurgical recommendations. Will place on Decadron 4 mg twice a day. Neurosurgery following and appreciate input and recommendations.  #2 progressive right hemiparesis Likely secondary to problem #1. Stroke workup was done with 2-D echo with no source of emboli. Carotid Dopplers with no significant hemodynamic carotid stenosis. Pradaxa has been discontinued. We'll also discontinue aspirin per neurosurgical recommendations secondary to concern for hemorrhagic conversion of problem #1. PT/OT.  #3 atrial fibrillation Continue metoprolol for rate control. Will discontinue Pradaxa and aspirin secondary to problem #1.  #4 coronary artery disease Stable. Continue Lipitor, imdur, metoprolol.  #5. HTN Blood pressure is borderline. Decrease imdur to half home dose. Follow.  #6 history of lower extremity DVT Patient with history of lower extremity DVT was on Pradaxa, however this is being discontinued secondary to problem #1. We'll need to find out when patient's DVT was and that greater than a year we'll monitor for now. If recent may need IVC filter.  Code Status: Full Family Communication: Updated patient and daughter at bedside. Disposition Plan: Pending recommendations from neurosurgery and oncology.   Consultants:  Neurosurgery: Dr. no dominant 06/16/2013  Neurology: Dr. Nicole Kindred 06/11/2013  Procedures:  MRI with contrast brain 06/15/2013  MRI  without contrast MRA head 06/12/2013  Carotid Dopplers 06/13/2013  2-D echo 06/12/2013  CT head 06/11/2013  CXR 06/11/2013  Antibiotics:  None  HPI/Subjective: Patient with no complaints. Patient states feeling somewhat better.  Objective: Filed Vitals:   06/16/13 1730  BP: 97/60  Pulse: 91  Temp: 98.8 F (37.1 C)  Resp: 18    Intake/Output Summary (Last 24 hours) at 06/16/13 1817 Last data filed at 06/16/13 0820  Gross per 24 hour  Intake    240 ml  Output      0 ml  Net    240 ml   Filed Weights   06/13/13 0530  Weight: 67.6 kg (149 lb 0.5 oz)    Exam:   General:  NAD  Cardiovascular: RRR  Respiratory: CTAB  Abdomen: Soft/NT/ND/+BS  Musculoskeletal: No c/c/e   Data Reviewed: Basic Metabolic Panel:  Recent Labs Lab 06/11/13 1137 06/11/13 1150 06/12/13 0543 06/16/13 0641  NA 141 140 142 139  K 3.5* 3.3* 4.4 3.9  CL 100 96 104 101  CO2 28  --  27 28  GLUCOSE 102* 102* 87 82  BUN 20 21 14 11   CREATININE 1.20 1.50* 0.98 0.89  CALCIUM 9.9  --  9.7 9.6   Liver Function Tests:  Recent Labs Lab 06/11/13 1137  AST 16  ALT 10  ALKPHOS 64  BILITOT 0.4  PROT 6.1  ALBUMIN 3.4*   No results found for this basename: LIPASE, AMYLASE,  in the last 168 hours No results found for this basename: AMMONIA,  in the last 168 hours CBC:  Recent Labs Lab 06/11/13 1137 06/11/13 1150 06/12/13 0543 06/16/13 0641  WBC 8.2  --  6.5 6.2  NEUTROABS 6.1  --   --   --   HGB 11.0* 12.2* 10.6*  11.0*  HCT 33.6* 36.0* 33.2* 34.7*  MCV 81.4  --  82.0 80.9  PLT 202  --  194 190   Cardiac Enzymes: No results found for this basename: CKTOTAL, CKMB, CKMBINDEX, TROPONINI,  in the last 168 hours BNP (last 3 results)  Recent Labs  05/04/13 1930  PROBNP 604.2*   CBG:  Recent Labs Lab 06/11/13 1217  GLUCAP 85    No results found for this or any previous visit (from the past 240 hour(s)).   Studies: [name] Contrast  06/15/2013   CLINICAL DATA  Brainstem mass.  EXAM MRI HEAD WITH CONTRAST  TECHNIQUE Multiplanar, multiecho pulse sequences of the brain and surrounding structures were obtained with intravenous contrast.  COMPARISON Noncontrast MRI brain 06/12/2013  CONTRAST 64mL MULTIHANCE GADOBENATE DIMEGLUMINE 529 MG/ML IV SOLN  FINDINGS The lesion in the left midbrain demonstrates predominantly peripheral enhancement measuring 2.1 x 2.1 x 1.7 cm. Surrounding vasogenic edema is again seen. The edema extends superiorly in the left cerebral peduncle and along white matter tracts. There is also edema extending inferiorly in the brainstem and left cerebellar peduncle. The enhancement characteristics are most typical of a mass. 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 A focal metastasis is also considered. This does not have the appearance of an infarct. Scattered white matter changes are again noted bilaterally.  IMPRESSION 1. 2.1 x 2.1 x 1.7 cm peripherally enhancing mass lesion. 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 Metastatic disease is also considered. 2. Atrophy and white matter disease is again noted.  SIGNATURE  Electronically Signed   By: Lawrence Santiago M.D.   On: 06/15/2013 15:54    Scheduled Meds: . aspirin EC  81 mg Oral Daily  . atorvastatin  40 mg Oral Daily  . budesonide-formoterol  2 puff Inhalation BID  . citalopram  10 mg Oral Daily  . dexamethasone  4 mg Intravenous Q12H  . isosorbide mononitrate  60 mg Oral Daily  . meclizine  25 mg Oral Daily  . metoprolol succinate  25 mg Oral Daily  . multivitamin with minerals  1 tablet Oral QPM   Continuous Infusions:   Principal Problem:   Brain mass Active Problems:   Atrial fibrillation   CAD (coronary artery disease)   Hypertension   Hyperlipidemia   History of DVT of lower extremity   Stroke   CVA (cerebral infarction)    Time spent: [place] MD [facility] Pager [phone]. If 7PM-7AM, please contact night-coverage at [email or web address], password [facility] 06/16/2013, 6:17 PM  LOS: 5 days

## 2013-06-17 ENCOUNTER — Inpatient Hospital Stay (HOSPITAL_COMMUNITY): Payer: Medicare Other

## 2013-06-17 LAB — BASIC METABOLIC PANEL
BUN: 16 mg/dL (ref 6–23)
CALCIUM: 9.8 mg/dL (ref 8.4–10.5)
CHLORIDE: 96 meq/L (ref 96–112)
CO2: 24 mEq/L (ref 19–32)
CREATININE: 0.83 mg/dL (ref 0.50–1.35)
GFR calc Af Amer: 89 mL/min — ABNORMAL LOW (ref >90)
GFR calc non Af Amer: 77 mL/min — ABNORMAL LOW (ref >90)
GLUCOSE: 126 mg/dL — AB (ref 70–99)
Potassium: 4.4 mEq/L (ref 3.7–5.3)
Sodium: 135 mEq/L — ABNORMAL LOW (ref 137–147)

## 2013-06-17 LAB — CBC
HEMATOCRIT: 34.8 % — AB (ref 39.0–52.0)
HEMOGLOBIN: 11.3 g/dL — AB (ref 13.0–17.0)
MCH: 26 pg (ref 26.0–34.0)
MCHC: 32.5 g/dL (ref 30.0–36.0)
MCV: 80.2 fL (ref 78.0–100.0)
PLATELETS: 222 10*3/uL (ref 150–400)
RBC: 4.34 MIL/uL (ref 4.22–5.81)
RDW: 16.2 % — ABNORMAL HIGH (ref 11.5–15.5)
WBC: 6.9 10*3/uL (ref 4.0–10.5)

## 2013-06-17 MED ORDER — GADOBENATE DIMEGLUMINE 529 MG/ML IV SOLN
15.0000 mL | Freq: Once | INTRAVENOUS | Status: AC
  Administered 2013-06-17: 14 mL via INTRAVENOUS

## 2013-06-17 MED ORDER — METOPROLOL SUCCINATE 12.5 MG HALF TABLET
12.5000 mg | ORAL_TABLET | Freq: Every day | ORAL | Status: DC
  Filled 2013-06-17: qty 1

## 2013-06-17 MED ORDER — GI COCKTAIL ~~LOC~~
30.0000 mL | Freq: Four times a day (QID) | ORAL | Status: DC | PRN
  Administered 2013-06-17: 30 mL via ORAL
  Filled 2013-06-17: qty 30

## 2013-06-17 NOTE — Consult Note (Signed)
Ulyses Southward SN [facility] [employer] Resa Miner [employer]

## 2013-06-17 NOTE — Progress Notes (Signed)
Subjective: Patient sitting up in chair without complaints. CT of chest/abdomen/pelvis done last night at midnight, but not yet read by radiologist. 3T SRS protocol MRI of brain with contrast not yet done. Radiation oncology consultation requested of Dr. Lisbeth Renshaw, medical oncology consultation requested from Dr. Humphrey Rolls.  Objective: Vital signs in last 24 hours: Filed Vitals:   06/16/13 2139 06/16/13 2200 06/17/13 0128 06/17/13 0500  BP:  117/72 130/63 103/55  Pulse:  80 87 87  Temp:  98.3 F (36.8 C) 97.8 F (36.6 C) 97.3 F (36.3 C)  TempSrc:  Oral Oral Oral  Resp:  16 22 18   Height:      Weight:      SpO2: 98% 94% 100% 99%    Intake/Output from previous day: 03/11 0701 - 03/12 0700 In: 480 [P.O.:480] Out: 2225 [Urine:2225] Intake/Output this shift:    Physical Exam:  Awake and alert, fully oriented. Right hemiparesis is without significant change.  CBC  Recent Labs  06/16/13 0641 06/17/13 0513  WBC 6.2 6.9  HGB 11.0* 11.3*  HCT 34.7* 34.8*  PLT 190 222   BMET  Recent Labs  06/16/13 0641 06/17/13 0513  NA 139 135*  K 3.9 4.4  CL 101 96  CO2 28 24  GLUCOSE 82 126*  BUN 11 16  CREATININE 0.89 0.83  CALCIUM 9.6 9.8    Studies/Results: Mr Brain W Contrast  06/15/2013   CLINICAL DATA Brainstem mass.  EXAM MRI HEAD WITH CONTRAST  TECHNIQUE Multiplanar, multiecho pulse sequences of the brain and surrounding structures were obtained with intravenous contrast.  COMPARISON Noncontrast MRI brain 06/12/2013  CONTRAST 19mL MULTIHANCE GADOBENATE DIMEGLUMINE 529 MG/ML IV SOLN  FINDINGS The lesion in the left midbrain demonstrates predominantly peripheral enhancement measuring 2.1 x 2.1 x 1.7 cm. Surrounding vasogenic edema is again seen. The edema extends superiorly in the left cerebral peduncle and along white matter tracts. There is also edema extending inferiorly in the brainstem and left cerebellar peduncle. The enhancement characteristics are most typical of a mass. In the  absence of other focal lesions, of this is most concerning for a primary astrocytoma. A focal metastasis is also considered. This does not have the appearance of an infarct. Scattered white matter changes are again noted bilaterally.  IMPRESSION 1. 2.1 x 2.1 x 1.7 cm peripherally enhancing mass lesion. 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 Metastatic disease is also considered. 2. Atrophy and white matter disease is again noted.  SIGNATURE  Electronically Signed   By: Lawrence Santiago M.D.   On: 06/15/2013 15:54    Assessment/Plan: Discussed case with Dr. Irine Seal this morning. Will need patient transferred early tomorrow morning [facility], so that he can undergo radiation oncology simulation by midday tomorrow.  Await interpretation of CT C/A/P and MRI today.   Hosie Spangle, MD 06/17/2013, 8:11 AM

## 2013-06-17 NOTE — Progress Notes (Signed)
[facility] PROGRESS NOTE  Gabriel Wood WUJ:[id number] DOB: 05-21-1923 DOA: 06/11/2013 PCP: Simona Huh, MD  Assessment/Plan: #1 brain mass Probable likely etiology of patient's symptoms. Patient does have a prior history of melanoma question as to whether this is metastatic disease. CT of the chest negative for mets.,CT of the abdomen and pelvis with osseous and intra-abdominal metastatic disease. Patient has been seen by neurosurgery who has consulted oncology and radiation oncology for further evaluation. Discontinued Pradaxa.  3T SRS protocol MRI of the brain with gadolinium is currently pending. Continue Decadron 4 mg twice a day. Will probably transfer patient to [facility] tomorrow morning so they can undergo radiation oncology simulation. Neurosurgery following and appreciate input and recommendations.  #2 progressive right hemiparesis Likely secondary to problem #1. Stroke workup was done with 2-D echo with no source of emboli. Carotid Dopplers with no significant hemodynamic carotid stenosis. Pradaxa has been discontinued. PT/OT.  #3 atrial fibrillation Continue metoprolol for rate control. Aspirin.   #4 coronary artery disease Stable. Continue Lipitor. Will discontinue imdur. Decrease metoprolol dose secondary to borderline blood pressure.  #5. HTN Blood pressure is borderline. Will discontinue imdur and decrease metoprolol.   #6 ???? history of lower extremity DVT Patient with history of lower extremity DVT was on Pradaxa epic. Patient and daughter however state does not recall ever having a DVT. Pradaxa has been discontinued. Follow.  Code Status: Full Family Communication: Updated patient and daughter at bedside. Disposition Plan: Pending recommendations from neurosurgery and oncology.   Consultants:  Neurosurgery: Dr. no dominant 06/16/2013  Neurology: Dr. Nicole Kindred 06/11/2013  Oncology pending  Radiation oncology: Dr. Lisbeth Renshaw  Procedures:  MRI  with contrast brain 06/15/2013  MRI without contrast MRA head 06/12/2013  Carotid Dopplers 06/13/2013  2-D echo 06/12/2013  CT head 06/11/2013  CXR 06/11/2013  CT abdomen pelvis and chest 06/16/2013  Antibiotics:  None  HPI/Subjective: Patient with no complaints. Patient states feeling somewhat better.  Objective: Filed Vitals:   06/17/13 0845  BP: 110/63  Pulse: 88  Temp: 97.2 F (36.2 C)  Resp: 24    Intake/Output Summary (Last 24 hours) at 06/17/13 0955 Last data filed at 06/17/13 0200  Gross per 24 hour  Intake    240 ml  Output   2225 ml  Net  -1985 ml   Filed Weights   06/13/13 0530  Weight: 67.6 kg (149 lb 0.5 oz)    Exam:   General:  NAD  Cardiovascular: RRR  Respiratory: CTAB  Abdomen: Soft/NT/ND/+BS  Musculoskeletal: No c/c/e   Data Reviewed: Basic Metabolic Panel:  Recent Labs Lab 06/11/13 1137 06/11/13 1150 06/12/13 0543 06/16/13 0641 06/17/13 0513  NA 141 140 142 139 135*  K 3.5* 3.3* 4.4 3.9 4.4  CL 100 96 104 101 96  CO2 28  --  27 28 24   GLUCOSE 102* 102* 87 82 126*  BUN 20 21 14 11 16   CREATININE 1.20 1.50* 0.98 0.89 0.83  CALCIUM 9.9  --  9.7 9.6 9.8   Liver Function Tests:  Recent Labs Lab 06/11/13 1137  AST 16  ALT 10  ALKPHOS 64  BILITOT 0.4  PROT 6.1  ALBUMIN 3.4*   No results found for this basename: LIPASE, AMYLASE,  in the last 168 hours No results found for this basename: AMMONIA,  in the last 168 hours CBC:  Recent Labs Lab 06/11/13 1137 06/11/13 1150 06/12/13 0543 06/16/13 0641 06/17/13 0513  WBC 8.2  --  6.5 6.2 6.9  NEUTROABS 6.1  --   --   --   --  HGB 11.0* 12.2* 10.6* 11.0* 11.3*  HCT 33.6* 36.0* 33.2* 34.7* 34.8*  MCV 81.4  --  82.0 80.9 80.2  PLT 202  --  194 190 222   Cardiac Enzymes: No results found for this basename: CKTOTAL, CKMB, CKMBINDEX, TROPONINI,  in the last 168 hours BNP (last 3 results)  Recent Labs  05/04/13 1930  PROBNP 604.2*   CBG:  Recent Labs Lab  06/11/13 1217  GLUCAP 85    No results found for this or any previous visit (from the past 240 hour(s)).   Studies: Ct Chest W Contrast  06/17/2013   CLINICAL DATA Brain mass.  Rule out metastases.  EXAM CT CHEST, ABDOMEN, AND PELVIS WITH CONTRAST  TECHNIQUE Multidetector CT imaging of the chest, abdomen and pelvis was performed following the standard protocol during bolus administration of intravenous contrast.  CONTRAST 176mL OMNIPAQUE IOHEXOL 300 MG/ML  SOLN  COMPARISON Chest CT 04/07/2011.  Abdominal CT 03/11/2011  FINDINGS CT CHEST FINDINGS  THORACIC INLET/BODY WALL:  No acute abnormality.  MEDIASTINUM:  Cardiomegaly, with notable left atrial enlargement. Extensive atherosclerosis of the coronary arteries, status post CABG. The LIMA graft is not well opacified, but the saphenous vein grafts enhance well. No evidence of acute vascular abnormality.  LUNG WINDOWS:  Low volume lungs. There is chronic cysts in the peripheral right lower lung, with surrounding fibrosis. Bandlike opacities at the right base, with right that pad elevation, most consistent with atelectasis. No metastatic disease.  OSSEOUS:  No acute fracture.  No suspicious lytic or blastic lesions.  CT ABDOMEN AND PELVIS FINDINGS  ABDOMEN/PELVIS:  Liver: 4 cm cyst in the anterior liver. Caudate lobe enlargement without definitive features of cirrhosis on the liver surface.  Biliary: Cholecystectomy.  Pancreas: Unremarkable.  Spleen: Mild granulomatous changes.  Adrenals: 2.6 x 2.4 cm mass in the right adrenal gland. There may have been a small nodule previously, but none of the size.  Kidneys and ureters: There is a 4 cm solid mass in the region of the left lower pole. Although touching the lower pole, it does not appear to arise from the kidney. There are bilateral renal cysts, stable from prior. Bilateral nephrolithiasis, nonobstructive, with the largest on on the left measuring 6 mm in the lower pole and the largest on the right measuring 5  mm.  Bladder: No acute abnormality  Reproductive: Prostate enlargement  Bowel: No evidence of primary mass.  Retroperitoneum: 4 cm left perirenal mass as above. There is also a 6 mm posterior pararenal space nodule just above the right iliac crest, measuring 7 mm. There is a 2 cm mass just deep to the right inferior epigastric neurovascular bundle measuring 2 cm. This is likely extraperitoneal. There is contact with bowel which is otherwise unremarkable.  Peritoneum: No free fluid or gas.  Vascular: No acute abnormality.  OSSEOUS: 4 cm aggressive bone lesion in the left iliac crest comminuted anterior superior iliac spine.  Remote appearing T12 compression fracture with distracted, remote appearing T11 spinous process fracture. Grade 2 L5-S1 anterolisthesis.  IMPRESSION Osseous and intra-abdominal metastatic disease. The primary source is not definitively identified in the chest or abdomen. The intra-abdominal pattern of spread is to the retroperitoneum (non-nodal) and right adrenal gland. Is there any indication of melanoma on exam?  SIGNATURE  Electronically Signed   By: Jorje Guild M.D.   On: 06/17/2013 08:25   [name] Contrast  06/15/2013   CLINICAL DATA Brainstem mass.  EXAM MRI HEAD WITH CONTRAST  TECHNIQUE  Multiplanar, multiecho pulse sequences of the brain and surrounding structures were obtained with intravenous contrast.  COMPARISON Noncontrast MRI brain 06/12/2013  CONTRAST 82mL MULTIHANCE GADOBENATE DIMEGLUMINE 529 MG/ML IV SOLN  FINDINGS The lesion in the left midbrain demonstrates predominantly peripheral enhancement measuring 2.1 x 2.1 x 1.7 cm. Surrounding vasogenic edema is again seen. The edema extends superiorly in the left cerebral peduncle and along white matter tracts. There is also edema extending inferiorly in the brainstem and left cerebellar peduncle. The enhancement characteristics are most typical of a mass. In the absence of other focal lesions, of this is most concerning for a  primary astrocytoma. A focal metastasis is also considered. This does not have the appearance of an infarct. Scattered white matter changes are again noted bilaterally.  IMPRESSION 1. 2.1 x 2.1 x 1.7 cm peripherally enhancing mass lesion. 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 Metastatic disease is also considered. 2. Atrophy and white matter disease is again noted.  SIGNATURE  Electronically Signed   By: Lawrence Santiago M.D.   On: 06/15/2013 15:54   Ct Abdomen Pelvis W Contrast  06/17/2013   CLINICAL DATA Brain mass.  Rule out metastases.  EXAM CT CHEST, ABDOMEN, AND PELVIS WITH CONTRAST  TECHNIQUE Multidetector CT imaging of the chest, abdomen and pelvis was performed following the standard protocol during bolus administration of intravenous contrast.  CONTRAST 161mL OMNIPAQUE IOHEXOL 300 MG/ML  SOLN  COMPARISON Chest CT 04/07/2011.  Abdominal CT 03/11/2011  FINDINGS CT CHEST FINDINGS  THORACIC INLET/BODY WALL:  No acute abnormality.  MEDIASTINUM:  Cardiomegaly, with notable left atrial enlargement. Extensive atherosclerosis of the coronary arteries, status post CABG. The LIMA graft is not well opacified, but the saphenous vein grafts enhance well. No evidence of acute vascular abnormality.  LUNG WINDOWS:  Low volume lungs. There is chronic cysts in the peripheral right lower lung, with surrounding fibrosis. Bandlike opacities at the right base, with right that pad elevation, most consistent with atelectasis. No metastatic disease.  OSSEOUS:  No acute fracture.  No suspicious lytic or blastic lesions.  CT ABDOMEN AND PELVIS FINDINGS  ABDOMEN/PELVIS:  Liver: 4 cm cyst in the anterior liver. Caudate lobe enlargement without definitive features of cirrhosis on the liver surface.  Biliary: Cholecystectomy.  Pancreas: Unremarkable.  Spleen: Mild granulomatous changes.  Adrenals: 2.6 x 2.4 cm mass in the right adrenal gland. There may have been a small  nodule previously, but none of the size.  Kidneys and ureters: There is a 4 cm solid mass in the region of the left lower pole. Although touching the lower pole, it does not appear to arise from the kidney. There are bilateral renal cysts, stable from prior. Bilateral nephrolithiasis, nonobstructive, with the largest on on the left measuring 6 mm in the lower pole and the largest on the right measuring 5 mm.  Bladder: No acute abnormality  Reproductive: Prostate enlargement  Bowel: No evidence of primary mass.  Retroperitoneum: 4 cm left perirenal mass as above. There is also a 6 mm posterior pararenal space nodule just above the right iliac crest, measuring 7 mm. There is a 2 cm mass just deep to the right inferior epigastric neurovascular bundle measuring 2 cm. This is likely extraperitoneal. There is contact with bowel which is otherwise unremarkable.  Peritoneum: No free fluid or gas.  Vascular: No acute abnormality.  OSSEOUS: 4 cm aggressive bone lesion in the left iliac crest comminuted anterior superior iliac spine.  Remote appearing T12 compression fracture with distracted, remote appearing T11 spinous process fracture. Grade 2 L5-S1 anterolisthesis.  IMPRESSION Osseous and intra-abdominal metastatic disease. The primary source is not definitively identified in the chest or abdomen. The intra-abdominal pattern of spread is to the retroperitoneum (non-nodal) and right adrenal gland. Is there any indication of melanoma on exam?  SIGNATURE  Electronically Signed   By: Jorje Guild M.D.   On: 06/17/2013 08:25    Scheduled Meds: . aspirin EC  81 mg Oral Daily  . atorvastatin  40 mg Oral Daily  . budesonide-formoterol  2 puff Inhalation BID  . citalopram  10 mg Oral Daily  . dexamethasone  4 mg Intravenous Q12H  . isosorbide mononitrate  60 mg Oral Daily  . meclizine  25 mg Oral Daily  . metoprolol succinate  25 mg Oral Daily  . multivitamin with minerals  1 tablet Oral QPM   Continuous Infusions:    Principal Problem:   Brain mass Active Problems:   Atrial fibrillation   CAD (coronary artery disease)   Hypertension   Hyperlipidemia   History of DVT of lower extremity   Stroke   CVA (cerebral infarction)    Time spent: [place] MD [facility] Pager [phone]. If 7PM-7AM, please contact night-coverage at [email or web address], password [facility] 06/17/2013, 9:55 AM  LOS: 6 days

## 2013-06-17 NOTE — Plan of Care (Signed)
Told dr Grandville Silos that social worker emily states pt needs a PET scan prior to nursing home. md states pt is not leaving now and will address later.

## 2013-06-17 NOTE — Progress Notes (Signed)
Physical Therapy Treatment Patient Details Name: Gabriel Wood MRN: [id number] DOB: 1923-08-21 Today's Date: 06/17/2013 Time: 1219-7588 PT Time Calculation (min): 31 min  PT Assessment / Plan / Recommendation  History of Present Illness Pt admit with left cerebral peduncle and pons infarct.   3/11 - New diagnosis of mass on brain stem. Will be receiving radiology.    PT Comments   Patient continues to be highly motivated and progresses well with therapy. Appears more off balance today with walking but endurance is getting better. Patient showing some emotion in learning about new mass on brain stem. Patient is planning to meet with oncology MD regarding treatment. Continue to recommend SNF for ongoing Physical Therapy.     Follow Up Recommendations  SNF;Supervision/Assistance - 24 hour     Does the patient have the potential to tolerate intense rehabilitation     Barriers to Discharge        Equipment Recommendations       Recommendations for Other Services    Frequency Min 3X/week   Progress towards PT Goals Progress towards PT goals: Progressing toward goals  Plan Current plan remains appropriate    Precautions / Restrictions Precautions Precautions: Fall Restrictions Weight Bearing Restrictions: No   Pertinent Vitals/Pain Denied pain    Mobility  Transfers Sit to Stand: Min assist General transfer comment: Pt has to rock forward multiple times in order to stand. Cues for hand placement Ambulation/Gait Ambulation/Gait assistance: Mod assist Ambulation Distance (Feet): 200 Feet Assistive device: Rolling walker (2 wheeled) Gait Pattern/deviations: Step-through pattern;Decreased stride length General Gait Details: Patient did not need seated rest break but did require increase A for balance and increased cues for safety and positioning of RW Modified Rankin (Stroke Patients Only) Pre-Morbid Rankin Score: Moderately severe disability Modified Rankin: Moderately  severe disability    Exercises General Exercises - Lower Extremity Long Arc Quad: AROM;Both;15 reps Hip Flexion/Marching: AROM;Both;15 reps   PT Diagnosis:    PT Problem List:   PT Treatment Interventions:     PT Goals (current goals can now be found in the care plan section)    Visit Information  Last PT Received On: 06/17/13 Assistance Needed: +1 History of Present Illness: Pt admit with left cerebral peduncle and pons infarct.   3/11 - New diagnosis of mass on brain stem. Will be receiving radiology.     Subjective Data      Cognition  Cognition Arousal/Alertness: Awake/alert Behavior During Therapy: WFL for tasks assessed/performed Overall Cognitive Status: Within Functional Limits for tasks assessed    Balance     End of Session PT - End of Session Equipment Utilized During Treatment: Gait belt;Oxygen Activity Tolerance: Patient tolerated treatment well Patient left: in chair;with call bell/phone within reach;with chair alarm set Nurse Communication: Mobility status   GP     Jacqualyn Posey 06/17/2013, 10:19 AM 06/17/2013 Jacqualyn Posey PTA [phone] pager [phone] office

## 2013-06-18 ENCOUNTER — Telehealth: Payer: Self-pay | Admitting: *Deleted

## 2013-06-18 ENCOUNTER — Ambulatory Visit
Admit: 2013-06-18 | Discharge: 2013-06-18 | Disposition: A | Discharge status: Home / Self Care | Payer: Medicare Other | Attending: Radiation Oncology | Admitting: Radiation Oncology

## 2013-06-18 ENCOUNTER — Ambulatory Visit: Payer: Medicare Other

## 2013-06-18 DIAGNOSIS — M6281 Muscle weakness (generalized): Secondary | ICD-10-CM

## 2013-06-18 DIAGNOSIS — C439 Malignant melanoma of skin, unspecified: Secondary | ICD-10-CM

## 2013-06-18 DIAGNOSIS — C7931 Secondary malignant neoplasm of brain: Secondary | ICD-10-CM

## 2013-06-18 DIAGNOSIS — R29810 Facial weakness: Secondary | ICD-10-CM

## 2013-06-18 DIAGNOSIS — C7952 Secondary malignant neoplasm of bone marrow: Secondary | ICD-10-CM

## 2013-06-18 DIAGNOSIS — C7951 Secondary malignant neoplasm of bone: Secondary | ICD-10-CM | POA: Insufficient documentation

## 2013-06-18 DIAGNOSIS — C799 Secondary malignant neoplasm of unspecified site: Secondary | ICD-10-CM | POA: Diagnosis present

## 2013-06-18 DIAGNOSIS — I509 Heart failure, unspecified: Secondary | ICD-10-CM

## 2013-06-18 DIAGNOSIS — C4360 Malignant melanoma of unspecified upper limb, including shoulder: Secondary | ICD-10-CM

## 2013-06-18 DIAGNOSIS — N189 Chronic kidney disease, unspecified: Secondary | ICD-10-CM

## 2013-06-18 DIAGNOSIS — C7949 Secondary malignant neoplasm of other parts of nervous system: Principal | ICD-10-CM

## 2013-06-18 DIAGNOSIS — Z510 Encounter for antineoplastic radiation therapy: Secondary | ICD-10-CM | POA: Insufficient documentation

## 2013-06-18 DIAGNOSIS — C786 Secondary malignant neoplasm of retroperitoneum and peritoneum: Secondary | ICD-10-CM

## 2013-06-18 LAB — CBC
HCT: 34.5 % — ABNORMAL LOW (ref 39.0–52.0)
Hemoglobin: 11 g/dL — ABNORMAL LOW (ref 13.0–17.0)
MCH: 25.5 pg — AB (ref 26.0–34.0)
MCHC: 31.9 g/dL (ref 30.0–36.0)
MCV: 80 fL (ref 78.0–100.0)
PLATELETS: 208 10*3/uL (ref 150–400)
RBC: 4.31 MIL/uL (ref 4.22–5.81)
RDW: 16.4 % — ABNORMAL HIGH (ref 11.5–15.5)
WBC: 7.1 10*3/uL (ref 4.0–10.5)

## 2013-06-18 LAB — BASIC METABOLIC PANEL
BUN: 20 mg/dL (ref 6–23)
CALCIUM: 9.4 mg/dL (ref 8.4–10.5)
CO2: 26 mEq/L (ref 19–32)
Chloride: 100 mEq/L (ref 96–112)
Creatinine, Ser: 0.9 mg/dL (ref 0.50–1.35)
GFR calc Af Amer: 86 mL/min — ABNORMAL LOW (ref >90)
GFR, EST NON AFRICAN AMERICAN: 74 mL/min — AB (ref >90)
Glucose, Bld: 130 mg/dL — ABNORMAL HIGH (ref 70–99)
Potassium: 4.7 mEq/L (ref 3.7–5.3)
Sodium: 138 mEq/L (ref 137–147)

## 2013-06-18 MED ORDER — DEXAMETHASONE 4 MG PO TABS: 4.0000 mg | ORAL_TABLET | Freq: Two times a day (BID) | ORAL | Status: DC

## 2013-06-18 MED ORDER — METOPROLOL SUCCINATE ER 25 MG PO TB24: 12.5000 mg | ORAL_TABLET | Freq: Every day | ORAL | Status: DC

## 2013-06-18 NOTE — Consult Note (Signed)
Radiation Oncology         [phone] ________________________________  Name: Gabriel Wood MRN: [id number]  Date: 06/11/2013  DOB: 14-Feb-1924   DIAGNOSIS:  Metastatic melanoma with brain metastasis  HISTORY OF PRESENT ILLNESS::Gabriel Wood is a 80 y.o. male who is seen for an initial consultation visit. The patient was admitted with a several week history of progressive worsening right-sided weakness. The weakness was most prominent in the right upper extremity but also was notable within the right lower [place] in the right side of the face. The patient was admitted to the hospital last service and was evaluated by the stroke neurology service. Stroke workup was negative. An MRI scan of the brain was completed without contrast on 06/12/2013. A 2 cm heterogeneous lesion was seen within the left upper pons/lower midbrain.  No additional intracranial lesions seen. Further workup has been ordered including a CT scan of the chest abdomen and pelvis. The patient has a history of melanoma where [facility] was [facility]. The patient underwent resection of a melanoma in the region of the right shoulder with postoperative radiation performed to this region in 2014. This consisted of a 4 week course of treatment. He denies any other history of radiation treatment. The patient has been started on steroids. He denies today any major change in his symptoms.  PREVIOUS RADIATION THERAPY: Yes present above radiation treatment to the right shoulder at [facility] in 2014 for melanoma status post resection   PAST MEDICAL HISTORY:  has a past medical history of History of colon polyps; Anemia; Chronic kidney disease; Ulcer; IBS (irritable bowel syndrome); Anxiety; BPH (benign prostatic hypertrophy); CAD (coronary artery disease); Atrial fibrillation; AAA (abdominal aortic aneurysm); Arthritis; Chronic anticoagulation; Hypertension; Hyperlipidemia; History of DVT of lower extremity; Cancer (Sept. 2013); Anginal  pain; Shortness of breath; Transient ischemic attack (TIA) (07/2012); Fall at home (Dec. 2014); CHF (congestive heart failure); and COPD (chronic obstructive pulmonary disease).     PAST SURGICAL HISTORY: Past Surgical History  Procedure Laterality Date  . Appendectomy    . Cholecystectomy    . Tonsillectomy    . Rotator cuff repair      Right shoulder  . Coronary artery bypass graft    . Appendectomy    . Fracture surgery  2011    Left tibial ORIF by Dr. Sharol Given  . Ankle fusion  2011    Left ankle  . Lymph node biopsy    . Joint replacement Left Dec.  2014    Left shoulder replacement  . Kidney stones       FAMILY HISTORY: family history includes Cancer in his mother; Heart disease in his brother, brother, and brother.   SOCIAL HISTORY:  reports that he quit smoking about 17 years ago. He has never used smokeless tobacco. He reports that he does not drink alcohol or use illicit drugs.   ALLERGIES: Morphine and related and Codeine   MEDICATIONS:  Current Facility-Administered Medications  Medication Dose Route Frequency Provider Last Rate Last Dose  . acetaminophen (TYLENOL) tablet 650 mg  650 mg Oral Q4H PRN Sheila Oats, MD       Or  . acetaminophen (TYLENOL) suppository 650 mg  650 mg Rectal Q4H PRN Adeline C Viyuoh, MD      . albuterol (PROVENTIL) (2.5 MG/3ML) 0.083% nebulizer solution 2.5 mg  2.5 mg Nebulization Q6H PRN Adeline C Viyuoh, MD      . aspirin EC tablet 81 mg  81 mg  Oral Daily Donzetta Starch, NP   81 mg at 06/17/13 1030  . atorvastatin (LIPITOR) tablet 40 mg  40 mg Oral Daily Sheila Oats, MD   40 mg at 06/17/13 1030  . budesonide-formoterol (SYMBICORT) 160-4.5 MCG/ACT inhaler 2 puff  2 puff Inhalation BID Sheila Oats, MD   2 puff at 06/17/13 2040  . citalopram (CELEXA) tablet 10 mg  10 mg Oral Daily Adeline C Viyuoh, MD   10 mg at 06/17/13 1030  . dexamethasone (DECADRON) injection 4 mg  4 mg Intravenous Q12H Eugenie Filler, MD   4 mg at  06/17/13 2134  . furosemide (LASIX) tablet 20 mg  20 mg Oral PRN Sheila Oats, MD      . gi cocktail (Maalox,Lidocaine,Donnatal)  30 mL Oral QID PRN Theodis Blaze, MD   30 mL at 06/17/13 2257  . meclizine (ANTIVERT) tablet 25 mg  25 mg Oral Daily Adeline C Viyuoh, MD   25 mg at 06/17/13 1030  . metoprolol succinate (TOPROL-XL) 24 hr tablet 12.5 mg  12.5 mg Oral Daily Eugenie Filler, MD      . multivitamin with minerals tablet 1 tablet  1 tablet Oral QPM Sheila Oats, MD   1 tablet at 06/17/13 1031  . nitroGLYCERIN (NITROSTAT) SL tablet 0.4 mg  0.4 mg Sublingual Q5 min PRN Adeline C Viyuoh, MD      . promethazine (PHENERGAN) tablet 25 mg  25 mg Oral Q6H PRN Sheila Oats, MD      . RESOURCE THICKENUP CLEAR   Oral PRN Geradine Girt, DO      . senna-docusate (Senokot-S) tablet 1 tablet  1 tablet Oral QHS PRN Sheila Oats, MD         REVIEW OF SYSTEMS:  A 15 point review of systems is documented in the electronic medical record. This was obtained by the nursing staff. However, I reviewed this with the patient to discuss relevant findings and make appropriate changes.  Pertinent items are noted in HPI.    PHYSICAL EXAM:  height is 5\' 3"  (1.6 m) and weight is 149 lb 0.5 oz (67.6 kg). His axillary temperature is 98 F (36.7 C). His blood pressure is 143/84 and his pulse is 78. His respiration is 20 and oxygen saturation is 100%.   ECOG = 2  0 - Asymptomatic (Fully active, able to carry on all predisease activities without restriction)  1 - Symptomatic but completely ambulatory (Restricted in physically strenuous activity but ambulatory and able to carry out work of a light or sedentary nature. For example, light housework, office work)  2 - Symptomatic, <50% in bed during the day (Ambulatory and capable of all self care but unable to carry out any work activities. Up and about more than 50% of waking hours)  3 - Symptomatic, >50% in bed, but not bedbound (Capable of only limited  self-care, confined to bed or chair 50% or more of waking hours)  4 - Bedbound (Completely disabled. Cannot carry on any self-care. Totally confined to bed or chair)  5 - Death   Eustace Pen MM, Creech RH, Tormey DC, et al. [phone]). "Toxicity and response criteria of the [facility]". [place]ws Oncol. 5 (6): 649-55  General: Well-developed, in no acute distress HEENT: Normocephalic, atraumatic Cardiovascular: Regular rate and rhythm Respiratory: Clear to auscultation bilaterally GI: Soft, nontender, normal bowel sounds Extremities: No edema present Neuro - weakness present on the right, to  a greater extent within the right upper extremity versus the right lower extremity.   LABORATORY DATA:  Lab Results  Component Value Date   WBC 7.1 06/18/2013   HGB 11.0* 06/18/2013   HCT 34.5* 06/18/2013   MCV 80.0 06/18/2013   PLT 208 06/18/2013   Lab Results  Component Value Date   NA 138 06/18/2013   K 4.7 06/18/2013   CL 100 06/18/2013   CO2 26 06/18/2013   Lab Results  Component Value Date   ALT 10 06/11/2013   AST 16 06/11/2013   ALKPHOS 64 06/11/2013   BILITOT 0.4 06/11/2013      RADIOGRAPHY: Dg Chest 2 View  06/11/2013   CLINICAL DATA:  Cough and shortness of breath.  EXAM: CHEST  2 VIEW  COMPARISON:  06/08/2013 and 05/03/2013  FINDINGS: Chronic elevation of the right hemidiaphragm. Again noted are postsurgical changes in the left shoulder consistent with a shoulder arthroplasty. Patient also has median sternotomy wires. Chronic linear densities in the right lung and anterior chest on the lateral view. No evidence for acute airspace disease. There is volume loss at the left lung base. Heart size is stable. Again noted is a compression fracture in the lower thoracic spine.  IMPRESSION: Chronic lung changes with left basilar atelectasis. Minimal change from the previous examination.   Electronically Signed   By: Markus Daft M.D.   On: 06/11/2013 12:12   Dg Chest 2 View  06/08/2013    CLINICAL DATA:  Short of breath, cough, congestion and fatigue  EXAM: CHEST  2 VIEW  COMPARISON:  Portable chest x-ray of 05/04/2013 and 05/03/2013  FINDINGS: No active infiltrate or effusion is seen. Chronic elevation of the right hemidiaphragm is again noted. The heart is mildly enlarged and stable. Median sternotomy sutures are intact from prior CABG. A left shoulder replacement is present and there are degenerative changes of the right shoulder. However, on the lateral view, there is approximately 40% compression deformity of either T12 or L1 which has increased somewhat since the prior lateral chest x-ray.  IMPRESSION: 1. Furthder compression of either T12 or L1 when compared to the lateral chest x-ray of 05/03/2013, now approaching 40%. 2. Stable cardiomegaly and chronic elevation of the right hemidiaphragm.   Electronically Signed   By: Ivar Drape M.D.   On: 06/08/2013 16:28   Ct Head Wo Contrast  06/11/2013   CLINICAL DATA:  Right-sided weakness.  History of prior infarct.  EXAM: CT HEAD WITHOUT CONTRAST  TECHNIQUE: Contiguous axial images were obtained from the base of the skull through the vertex without intravenous contrast.  COMPARISON:  Brain MRI 07/30/2012.  Head CT scan 06/08/2013.  FINDINGS: Atrophy and chronic microvascular ischemic change are again seen. The left cerebral peduncle is hypoattenuating and swollen relative to the right consistent with subacute infarct. More inferiorly, there is hypoattenuation in the pons. These findings are unchanged since the most recent prior exam. The brain is atrophic with chronic microvascular ischemic change. No hemorrhage, midline shift or abnormal extra-axial fluid collection is identified. There is no hydrocephalus or pneumocephalus. The calvarium is intact.  IMPRESSION: Findings consistent with subacute infarct in the left cerebral peduncle and pons. The appearance is not markedly changed since the most recent head CT. Negative for hemorrhage.    Electronically Signed   By: Inge Rise M.D.   On: 06/11/2013 12:50   Ct Head Wo Contrast  06/08/2013   CLINICAL DATA:  Right arm weakness and facial droop  EXAM: CT  HEAD WITHOUT CONTRAST  TECHNIQUE: Contiguous axial images were obtained from the base of the skull through the vertex without intravenous contrast.  COMPARISON:  MR HEAD W/O CM dated 07/30/2012  FINDINGS: There is a new rounded low-density lesion in the left midbrain measuring 10 mm (image 12, series 32). There is extensive deep white matter hypodensities. No evidence of cerebral cortical infarction. No intracranial hemorrhage. No midline shift or mass effect. No hydrocephalus.  Paranasal sinuses and mastoid air cells are clear.  IMPRESSION: 1. Concern for left midbrain infarction.  Consider brain MRI. 2. Extensive white matter microvascular disease and cortical atrophy to similar prior. These results will be called to the ordering clinician or representative by the Radiologist Assistant, and communication documented in the PACS Dashboard.   Electronically Signed   By: Suzy Bouchard M.D.   On: 06/08/2013 16:56   Ct Chest W Contrast  06/17/2013   CLINICAL DATA Brain mass.  Rule out metastases.  EXAM CT CHEST, ABDOMEN, AND PELVIS WITH CONTRAST  TECHNIQUE Multidetector CT imaging of the chest, abdomen and pelvis was performed following the standard protocol during bolus administration of intravenous contrast.  CONTRAST 172mL OMNIPAQUE IOHEXOL 300 MG/ML  SOLN  COMPARISON Chest CT 04/07/2011.  Abdominal CT 03/11/2011  FINDINGS CT CHEST FINDINGS  THORACIC INLET/BODY WALL:  No acute abnormality.  MEDIASTINUM:  Cardiomegaly, with notable left atrial enlargement. Extensive atherosclerosis of the coronary arteries, status post CABG. The LIMA graft is not well opacified, but the saphenous vein grafts enhance well. No evidence of acute vascular abnormality.  LUNG WINDOWS:  Low volume lungs. There is chronic cysts in the peripheral right lower lung, with  surrounding fibrosis. Bandlike opacities at the right base, with right that pad elevation, most consistent with atelectasis. No metastatic disease.  OSSEOUS:  No acute fracture.  No suspicious lytic or blastic lesions.  CT ABDOMEN AND PELVIS FINDINGS  ABDOMEN/PELVIS:  Liver: 4 cm cyst in the anterior liver. Caudate lobe enlargement without definitive features of cirrhosis on the liver surface.  Biliary: Cholecystectomy.  Pancreas: Unremarkable.  Spleen: Mild granulomatous changes.  Adrenals: 2.6 x 2.4 cm mass in the right adrenal gland. There may have been a small nodule previously, but none of the size.  Kidneys and ureters: There is a 4 cm solid mass in the region of the left lower pole. Although touching the lower pole, it does not appear to arise from the kidney. There are bilateral renal cysts, stable from prior. Bilateral nephrolithiasis, nonobstructive, with the largest on on the left measuring 6 mm in the lower pole and the largest on the right measuring 5 mm.  Bladder: No acute abnormality  Reproductive: Prostate enlargement  Bowel: No evidence of primary mass.  Retroperitoneum: 4 cm left perirenal mass as above. There is also a 6 mm posterior pararenal space nodule just above the right iliac crest, measuring 7 mm. There is a 2 cm mass just deep to the right inferior epigastric neurovascular bundle measuring 2 cm. This is likely extraperitoneal. There is contact with bowel which is otherwise unremarkable.  Peritoneum: No free fluid or gas.  Vascular: No acute abnormality.  OSSEOUS: 4 cm aggressive bone lesion in the left iliac crest comminuted anterior superior iliac spine.  Remote appearing T12 compression fracture with distracted, remote appearing T11 spinous process fracture. Grade 2 L5-S1 anterolisthesis.  IMPRESSION Osseous and intra-abdominal metastatic disease. The primary source is not definitively identified in the chest or abdomen. The intra-abdominal pattern of spread is to the retroperitoneum  (  non-nodal) and right adrenal gland. Is there any indication of melanoma on exam?  SIGNATURE  Electronically Signed   By: Jorje Guild M.D.   On: 06/17/2013 08:25   Mr Brain Wo Contrast  06/12/2013   CLINICAL DATA:  Right-sided weakness, right facial weakness, slurred speech. Stroke.  EXAM: MRI HEAD WITHOUT CONTRAST  MRA HEAD WITHOUT CONTRAST  TECHNIQUE: Multiplanar, multiecho pulse sequences of the brain and surrounding structures were obtained without intravenous contrast. Angiographic images of the head were obtained using MRA technique without contrast.  COMPARISON:  Head CT 06/11/2013 and MRI/ MRA 07/30/2012  FINDINGS: MRI HEAD FINDINGS  There is a 2.1 x 1.9 cm focus of heterogeneous, predominantly hyperintense T2 signal centered in the left upper pons/lower midbrain. There is a thin rim of slightly increased diffusion weighted signal at its periphery which may demonstrate minimally restricted diffusion. There is facilitated diffusion in the central portion of the lesion. There is no intracranial hemorrhage. The left pons is mildly expanded, and there is T2/FLAIR hyperintensity throughout the pons and extending into the left middle cerebellar peduncle and left greater than right cerebral peduncle is compatible with edema. There is a small amount of edema in the posterior limb of internal capsule on the left as well.  There is moderate cerebral atrophy. Small, scattered foci of T2 hyperintensity are present in the subcortical and deep cerebral white matter bilaterally, not significantly changed and compatible with mild chronic small vessel ischemic disease. There is no midline shift or extra-axial fluid collection. Orbits are unremarkable. Paranasal sinuses and mastoid air cells are clear.  MRA HEAD FINDINGS  Images are moderately degraded by motion. Flow is again not identified in the distal right vertebral artery, consistent with proximal occlusion. The visualized distal left vertebral artery is patent  without high-grade stenosis. PICAs are not well visualized. Basilar artery is patent without evidence of significant stenosis. SCA origins are grossly patent. There is a fetal origin of the right PCA. PCAs are otherwise grossly unremarkable.  Internal carotid arteries are patent from skullbase to carotid termini. Bilateral carotid siphon irregularity is again seen. There is again evidence of high-grade stenosis in the anterior left cavernous ICA. The left A1 segment is again noted to be dominant with the azygos ACA anatomy. Relatively symmetric flow is identified within the MCAs with mild branch vessel irregularity bilaterally. No intracranial aneurysm is identified.  IMPRESSION: 1. 2 cm heterogeneous lesion involving the left upper pons/lower midbrain with surrounding edema. Findings may reflect a subacute infarct, however postcontrast imaging and follow-up MRI are recommended to exclude a mass. 2. Mild chronic small vessel ischemic disease. 3. Motion degraded MRA without evidence of new intracranial arterial occlusion. 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   Electronically Signed   By: Logan Bores   On: 06/12/2013 14:37   Mr Brain W Contrast  06/17/2013   ADDENDUM REPORT: 06/17/2013 16:09  ADDENDUM: The patient was brought back 06/17/2013 for postcontrast axial imaging according to the [facility] protocol.  This is done for [facility] planning purposes. The left midbrain lesion is again noted. No additional lesions are evident. There is no significant interval change.   Electronically Signed   By: Lawrence Santiago M.D.   On: 06/17/2013 16:09   06/17/2013   CLINICAL DATA:  Brainstem mass.  EXAM: MRI HEAD WITH CONTRAST  TECHNIQUE: Multiplanar, multiecho pulse sequences of the brain and surrounding structures were obtained with intravenous contrast.  COMPARISON:  Noncontrast  MRI brain 06/12/2013  CONTRAST:  43mL MULTIHANCE GADOBENATE DIMEGLUMINE 529  MG/ML IV SOLN  FINDINGS: The lesion in the left midbrain demonstrates predominantly peripheral enhancement measuring 2.1 x 2.1 x 1.7 cm. Surrounding vasogenic edema is again seen. The edema extends superiorly in the left cerebral peduncle and along white matter tracts. There is also edema extending inferiorly in the brainstem and left cerebellar peduncle. The enhancement characteristics are most typical of a mass. 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 A focal metastasis is also considered. This does not have the appearance of an infarct. Scattered white matter changes are again noted bilaterally.  IMPRESSION: 1. 2.1 x 2.1 x 1.7 cm peripherally enhancing mass lesion. 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 Metastatic disease is also considered. 2. Atrophy and white matter disease is again noted.  Electronically Signed: By: Lawrence Santiago M.D. On: 06/15/2013 15:54   Ct Abdomen Pelvis W Contrast  06/17/2013   CLINICAL DATA Brain mass.  Rule out metastases.  EXAM CT CHEST, ABDOMEN, AND PELVIS WITH CONTRAST  TECHNIQUE Multidetector CT imaging of the chest, abdomen and pelvis was performed following the standard protocol during bolus administration of intravenous contrast.  CONTRAST 164mL OMNIPAQUE IOHEXOL 300 MG/ML  SOLN  COMPARISON Chest CT 04/07/2011.  Abdominal CT 03/11/2011  FINDINGS CT CHEST FINDINGS  THORACIC INLET/BODY WALL:  No acute abnormality.  MEDIASTINUM:  Cardiomegaly, with notable left atrial enlargement. Extensive atherosclerosis of the coronary arteries, status post CABG. The LIMA graft is not well opacified, but the saphenous vein grafts enhance well. No evidence of acute vascular abnormality.  LUNG WINDOWS:  Low volume lungs. There is chronic cysts in the peripheral right lower lung, with surrounding fibrosis. Bandlike opacities at the right base, with right that pad elevation, most  consistent with atelectasis. No metastatic disease.  OSSEOUS:  No acute fracture.  No suspicious lytic or blastic lesions.  CT ABDOMEN AND PELVIS FINDINGS  ABDOMEN/PELVIS:  Liver: 4 cm cyst in the anterior liver. Caudate lobe enlargement without definitive features of cirrhosis on the liver surface.  Biliary: Cholecystectomy.  Pancreas: Unremarkable.  Spleen: Mild granulomatous changes.  Adrenals: 2.6 x 2.4 cm mass in the right adrenal gland. There may have been a small nodule previously, but none of the size.  Kidneys and ureters: There is a 4 cm solid mass in the region of the left lower pole. Although touching the lower pole, it does not appear to arise from the kidney. There are bilateral renal cysts, stable from prior. Bilateral nephrolithiasis, nonobstructive, with the largest on on the left measuring 6 mm in the lower pole and the largest on the right measuring 5 mm.  Bladder: No acute abnormality  Reproductive: Prostate enlargement  Bowel: No evidence of primary mass.  Retroperitoneum: 4 cm left perirenal mass as above. There is also a 6 mm posterior pararenal space nodule just above the right iliac crest, measuring 7 mm. There is a 2 cm mass just deep to the right inferior epigastric neurovascular bundle measuring 2 cm. This is likely extraperitoneal. There is contact with bowel which is otherwise unremarkable.  Peritoneum: No free fluid or gas.  Vascular: No acute abnormality.  OSSEOUS: 4 cm aggressive bone lesion in the left iliac crest comminuted anterior superior iliac spine.  Remote appearing T12 compression fracture with distracted, remote appearing T11 spinous process fracture. Grade 2 L5-S1 anterolisthesis.  IMPRESSION Osseous and intra-abdominal metastatic  disease. The primary source is not definitively identified in the chest or abdomen. The intra-abdominal pattern of spread is to the retroperitoneum (non-nodal) and right adrenal gland. Is there any indication of melanoma on exam?  SIGNATURE   Electronically Signed   By: Jorje Guild M.D.   On: 06/17/2013 08:25   Mr Jodene Nam Head/brain Wo Cm  06/12/2013   CLINICAL DATA:  Right-sided weakness, right facial weakness, slurred speech. Stroke.  EXAM: MRI HEAD WITHOUT CONTRAST  MRA HEAD WITHOUT CONTRAST  TECHNIQUE: Multiplanar, multiecho pulse sequences of the brain and surrounding structures were obtained without intravenous contrast. Angiographic images of the head were obtained using MRA technique without contrast.  COMPARISON:  Head CT 06/11/2013 and MRI/ MRA 07/30/2012  FINDINGS: MRI HEAD FINDINGS  There is a 2.1 x 1.9 cm focus of heterogeneous, predominantly hyperintense T2 signal centered in the left upper pons/lower midbrain. There is a thin rim of slightly increased diffusion weighted signal at its periphery which may demonstrate minimally restricted diffusion. There is facilitated diffusion in the central portion of the lesion. There is no intracranial hemorrhage. The left pons is mildly expanded, and there is T2/FLAIR hyperintensity throughout the pons and extending into the left middle cerebellar peduncle and left greater than right cerebral peduncle is compatible with edema. There is a small amount of edema in the posterior limb of internal capsule on the left as well.  There is moderate cerebral atrophy. Small, scattered foci of T2 hyperintensity are present in the subcortical and deep cerebral white matter bilaterally, not significantly changed and compatible with mild chronic small vessel ischemic disease. There is no midline shift or extra-axial fluid collection. Orbits are unremarkable. Paranasal sinuses and mastoid air cells are clear.  MRA HEAD FINDINGS  Images are moderately degraded by motion. Flow is again not identified in the distal right vertebral artery, consistent with proximal occlusion. The visualized distal left vertebral artery is patent without high-grade stenosis. PICAs are not well visualized. Basilar artery is patent without  evidence of significant stenosis. SCA origins are grossly patent. There is a fetal origin of the right PCA. PCAs are otherwise grossly unremarkable.  Internal carotid arteries are patent from skullbase to carotid termini. Bilateral carotid siphon irregularity is again seen. There is again evidence of high-grade stenosis in the anterior left cavernous ICA. The left A1 segment is again noted to be dominant with the azygos ACA anatomy. Relatively symmetric flow is identified within the MCAs with mild branch vessel irregularity bilaterally. No intracranial aneurysm is identified.  IMPRESSION: 1. 2 cm heterogeneous lesion involving the left upper pons/lower midbrain with surrounding edema. Findings may reflect a subacute infarct, however postcontrast imaging and follow-up MRI are recommended to exclude a mass. 2. Mild chronic small vessel ischemic disease. 3. Motion degraded MRA without evidence of new intracranial arterial occlusion. 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   Electronically Signed   By: Logan Bores   On: 06/12/2013 14:37       IMPRESSION: The patient is a pleasant 80 year old male. 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 He has been evaluated by Dr. Sherwood Gambler in neurosurgery and he has discussed with the patient that he is not a candidate for resection of this lesion. He appears a good candidate for possible that your data Jeralyn Ruths surgery given his pending further workup. The patient has not had evidence of metastatic disease prior  to this recent presentation. Medical oncology consultation is pending and the patient has been seen and managed at [facility] thus far regarding his melanoma.  I discussed with the patient the rationale for possible radiation treatment to this lesion. Specifically we discussed radiosurgery. I discussed with him the possible benefit of such treatment  as well as the side effects and risks including necrosis. We discussed that the tumor is in a particularly critical area of the brain and both tumor growth and treatment toxicity can be toxic.  The patient indicated that he was interested in proceeding with treatment.   PLAN: The patient tentatively has been scheduled for a simulation for radiosurgery planning on 06/18/2013. He is currently being planned for one fraction of radiosurgery next week. We will continue on his steroids for the time being and hopefully he can be tapered off of this after treatment is completed. The patient may benefit from radiation treatment at other sites of disease depending on his further workup. If he has extensive disease systemically then we will further clarify his goals of care.       ________________________________   Jodelle Gross, MD, PhD

## 2013-06-18 NOTE — Consult Note (Signed)
[facility]  [phone]) [phone] [phone]   MEDICAL ONCOLOGY - INITIAL CONSULATION    Referral MD: Dr. 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 Stroke Symptoms  : melanoma  HPI: 80 year old male with 2 to three-week history of progressively worsening right-sided weakness. The weakness is worst in the right upper extremity, but also present in the right lower extremity and the right side of the face. Patient was admitted to the triad hospitalist service and has been evaluated by the stroke neurology service. An extensive stroke workup was performed. MRI was done 3/10  revealed an enhancing mass lesion in the left midbrain. Patient seen by Dr. Newell CoralNudelman. He has recommended SRS of primary brain lesion. Work up for metastatic disease with CT chest abdomen/plevis performed on 3/11 reveals bone and abdominal mets. Asked to see patient for further recommendations. [facility]/p [facility] followed by radiation therapy.  [facility] therapy    Past Medical History  Diagnosis Date  . History of colon polyps   . Anemia   . Chronic kidney disease     History of Kidney Stones  . Ulcer     history of Peptic Ulcer Disease  . IBS (irritable bowel syndrome)   . Anxiety   . BPH (benign prostatic hypertrophy)   . CAD (coronary artery disease)   . Atrial fibrillation   . AAA (abdominal aortic aneurysm)   . Arthritis   . Chronic anticoagulation     On Coumadin therapy ( A Fib)  . Hypertension   . Hyperlipidemia   . History of DVT of lower extremity   . Cancer Sept. 2013    Melanoma,Right  posterior shoulder  . Anginal pain   . Shortness of breath   . Transient ischemic attack (TIA) 07/2012  . Fall at home Dec. 2014  . CHF (congestive heart failure)   . COPD (chronic  obstructive pulmonary disease)   :  Past Surgical History  Procedure Laterality Date  . Appendectomy    . Cholecystectomy    . Tonsillectomy    . Rotator cuff repair      Right shoulder  . Coronary artery bypass graft    . Appendectomy    . Fracture surgery  2011    Left tibial ORIF by Dr. Lajoyce Cornersuda  . Ankle fusion  2011    Left ankle  . Lymph node biopsy    . Joint replacement Left Dec.  2014    Left shoulder replacement  . Kidney stones    :  Current Facility-Administered Medications  Medication Dose Route Frequency Provider Last Rate Last Dose  . acetaminophen (TYLENOL) tablet 650 mg  650 mg Oral Q4H PRN Kela MillinAdeline C Viyuoh, MD       Or  . acetaminophen (TYLENOL) suppository 650 mg  650 mg Rectal Q4H PRN Adeline C Viyuoh, MD      . albuterol (PROVENTIL) (2.5 MG/3ML) 0.083% nebulizer solution 2.5 mg  2.5 mg Nebulization Q6H PRN Adeline C Viyuoh, MD      . aspirin EC tablet 81 mg  81 mg Oral Daily Layne BentonSharon L Biby, NP   81 mg at 06/17/13 1030  . atorvastatin (LIPITOR) tablet 40 mg  40 mg Oral Daily Adeline C Viyuoh, MD   40 mg at 06/17/13 1030  . budesonide-formoterol (SYMBICORT)  160-4.5 MCG/ACT inhaler 2 puff  2 puff Inhalation BID Sheila Oats, MD   2 puff at 06/17/13 2040  . citalopram (CELEXA) tablet 10 mg  10 mg Oral Daily Adeline C Viyuoh, MD   10 mg at 06/17/13 1030  . dexamethasone (DECADRON) injection 4 mg  4 mg Intravenous Q12H Eugenie Filler, MD   4 mg at 06/17/13 2134  . furosemide (LASIX) tablet 20 mg  20 mg Oral PRN Sheila Oats, MD      . gi cocktail (Maalox,Lidocaine,Donnatal)  30 mL Oral QID PRN Theodis Blaze, MD   30 mL at 06/17/13 2257  . meclizine (ANTIVERT) tablet 25 mg  25 mg Oral Daily Adeline C Viyuoh, MD   25 mg at 06/17/13 1030  . metoprolol succinate (TOPROL-XL) 24 hr tablet 12.5 mg  12.5 mg Oral Daily Eugenie Filler, MD      . multivitamin with minerals tablet 1 tablet  1 tablet Oral QPM Sheila Oats, MD   1 tablet at 06/17/13 1031  .  nitroGLYCERIN (NITROSTAT) SL tablet 0.4 mg  0.4 mg Sublingual Q5 min PRN Adeline C Viyuoh, MD      . promethazine (PHENERGAN) tablet 25 mg  25 mg Oral Q6H PRN Sheila Oats, MD      . RESOURCE THICKENUP CLEAR   Oral PRN Geradine Girt, DO      . senna-docusate (Senokot-S) tablet 1 tablet  1 tablet Oral QHS PRN Sheila Oats, MD         Allergies  Allergen Reactions  . Morphine And Related Palpitations  . Codeine Nausea Only  :  Family History  Problem Relation Age of Onset  . Cancer Mother     stomach cancer  . Heart disease Brother   . Heart disease Brother   . Heart disease Brother   :  History   Social History  . Marital Status: Widowed    Spouse Name: N/A    Number of Children: N/A  . Years of Education: N/A   Occupational History  . Not on file.   Social History Main Topics  . Smoking status: Former Smoker    Quit date: 04/08/1996  . Smokeless tobacco: Never Used  . Alcohol Use: No     Comment: heavy alcohol use in the past  . Drug Use: No  . Sexual Activity: Not on file   Other Topics Concern  . Not on file   Social History Narrative  . No narrative on file  :    Exam: Patient Vitals for the past 24 hrs:  BP Temp Temp src Pulse Resp SpO2  06/18/13 0247 132/61 mmHg 98.3 F (36.8 C) Oral 67 20 100 %  06/17/13 2111 112/41 mmHg 97.6 F (36.4 C) Oral 92 20 93 %  06/17/13 2041 - - - - - 98 %  06/17/13 1820 99/56 mmHg 97.2 F (36.2 C) Oral 89 20 96 %  06/17/13 1417 100/83 mmHg 98.1 F (36.7 C) Oral 87 22 93 %  06/17/13 1007 114/58 mmHg 97.6 F (36.4 C) Oral 85 22 96 %  06/17/13 0845 110/63 mmHg 97.2 F (36.2 C) Oral 88 24 95 %    General:  well-nourished in no acute distress.  Eyes:  no scleral icterus.  ENT:  There were no oropharyngeal lesions.  Neck was without thyromegaly.  Lymphatics:  Negative cervical, supraclavicular or axillary adenopathy.  Respiratory: lungs were clear bilaterally without wheezing or crackles.  Cardiovascular:  Regular rate and rhythm, S1/S2, without murmur, rub or gallop.  There was no pedal edema.  GI:  abdomen was soft, flat, nontender, nondistended, without organomegaly.  Muscoloskeletal:  no spinal tenderness of palpation of vertebral spine.  Skin exam was without echymosis, petichae.  Neuro exam was nonfocal.  Patient was able to get on and off exam table without assistance.  Gait was normal.  Patient was alerted and oriented.  Attention was good.   Language was appropriate.  Mood was normal without depression.  Speech was not pressured.  Thought content was not tangential.     Lab Results  Component Value Date   WBC 6.9 06/17/2013   HGB 11.3* 06/17/2013   HCT 34.8* 06/17/2013   PLT 222 06/17/2013   GLUCOSE 126* 06/17/2013   CHOL 102 06/12/2013   TRIG 90 06/12/2013   HDL 42 06/12/2013   LDLCALC 42 06/12/2013   ALT 10 06/11/2013   AST 16 06/11/2013   NA 135* 06/17/2013   K 4.4 06/17/2013   CL 96 06/17/2013   CREATININE 0.83 06/17/2013   BUN 16 06/17/2013   CO2 24 06/17/2013   INR 1.26 06/11/2013   HGBA1C 5.4 06/12/2013    Dg Chest 2 View  06/11/2013   CLINICAL DATA:  Cough and shortness of breath.  EXAM: CHEST  2 VIEW  COMPARISON:  06/08/2013 and 05/03/2013  FINDINGS: Chronic elevation of the right hemidiaphragm. Again noted are postsurgical changes in the left shoulder consistent with a shoulder arthroplasty. Patient also has median sternotomy wires. Chronic linear densities in the right lung and anterior chest on the lateral view. No evidence for acute airspace disease. There is volume loss at the left lung base. Heart size is stable. Again noted is a compression fracture in the lower thoracic spine.  IMPRESSION: Chronic lung changes with left basilar atelectasis. Minimal change from the previous examination.   Electronically Signed   By: Richarda Overlie M.D.   On: 06/11/2013 12:12   Dg Chest 2 View  06/08/2013   CLINICAL DATA:  Short of breath, cough, congestion and fatigue  EXAM: CHEST  2 VIEW  COMPARISON:  Portable  chest x-ray of 05/04/2013 and 05/03/2013  FINDINGS: No active infiltrate or effusion is seen. Chronic elevation of the right hemidiaphragm is again noted. The heart is mildly enlarged and stable. Median sternotomy sutures are intact from prior CABG. A left shoulder replacement is present and there are degenerative changes of the right shoulder. However, on the lateral view, there is approximately 40% compression deformity of either T12 or L1 which has increased somewhat since the prior lateral chest x-ray.  IMPRESSION: 1. Furthder compression of either T12 or L1 when compared to the lateral chest x-ray of 05/03/2013, now approaching 40%. 2. Stable cardiomegaly and chronic elevation of the right hemidiaphragm.   Electronically Signed   By: Dwyane Dee M.D.   On: 06/08/2013 16:28   Ct Head Wo Contrast  06/11/2013   CLINICAL DATA:  Right-sided weakness.  History of prior infarct.  EXAM: CT HEAD WITHOUT CONTRAST  TECHNIQUE: Contiguous axial images were obtained from the base of the skull through the vertex without intravenous contrast.  COMPARISON:  Brain MRI 07/30/2012.  Head CT scan 06/08/2013.  FINDINGS: Atrophy and chronic microvascular ischemic change are again seen. The left cerebral peduncle is hypoattenuating and swollen relative to the right consistent with subacute infarct. More inferiorly, there is hypoattenuation in the pons. These findings are unchanged since the most recent prior exam. The brain  is atrophic with chronic microvascular ischemic change. No hemorrhage, midline shift or abnormal extra-axial fluid collection is identified. There is no hydrocephalus or pneumocephalus. The calvarium is intact.  IMPRESSION: Findings consistent with subacute infarct in the left cerebral peduncle and pons. The appearance is not markedly changed since the most recent head CT. Negative for hemorrhage.   Electronically Signed   By: Inge Rise M.D.   On: 06/11/2013 12:50   Ct Head Wo Contrast  06/08/2013    CLINICAL DATA:  Right arm weakness and facial droop  EXAM: CT HEAD WITHOUT CONTRAST  TECHNIQUE: Contiguous axial images were obtained from the base of the skull through the vertex without intravenous contrast.  COMPARISON:  MR HEAD W/O CM dated 07/30/2012  FINDINGS: There is a new rounded low-density lesion in the left midbrain measuring 10 mm (image 12, series 32). There is extensive deep white matter hypodensities. No evidence of cerebral cortical infarction. No intracranial hemorrhage. No midline shift or mass effect. No hydrocephalus.  Paranasal sinuses and mastoid air cells are clear.  IMPRESSION: 1. Concern for left midbrain infarction.  Consider brain MRI. 2. Extensive white matter microvascular disease and cortical atrophy to similar prior. These results will be called to the ordering clinician or representative by the Radiologist Assistant, and communication documented in the PACS Dashboard.   Electronically Signed   By: Suzy Bouchard M.D.   On: 06/08/2013 16:56   Ct Chest W Contrast  06/17/2013   CLINICAL DATA Brain mass.  Rule out metastases.  EXAM CT CHEST, ABDOMEN, AND PELVIS WITH CONTRAST  TECHNIQUE Multidetector CT imaging of the chest, abdomen and pelvis was performed following the standard protocol during bolus administration of intravenous contrast.  CONTRAST 138mL OMNIPAQUE IOHEXOL 300 MG/ML  SOLN  COMPARISON Chest CT 04/07/2011.  Abdominal CT 03/11/2011  FINDINGS CT CHEST FINDINGS  THORACIC INLET/BODY WALL:  No acute abnormality.  MEDIASTINUM:  Cardiomegaly, with notable left atrial enlargement. Extensive atherosclerosis of the coronary arteries, status post CABG. The LIMA graft is not well opacified, but the saphenous vein grafts enhance well. No evidence of acute vascular abnormality.  LUNG WINDOWS:  Low volume lungs. There is chronic cysts in the peripheral right lower lung, with surrounding fibrosis. Bandlike opacities at the right base, with right that pad elevation, most consistent  with atelectasis. No metastatic disease.  OSSEOUS:  No acute fracture.  No suspicious lytic or blastic lesions.  CT ABDOMEN AND PELVIS FINDINGS  ABDOMEN/PELVIS:  Liver: 4 cm cyst in the anterior liver. Caudate lobe enlargement without definitive features of cirrhosis on the liver surface.  Biliary: Cholecystectomy.  Pancreas: Unremarkable.  Spleen: Mild granulomatous changes.  Adrenals: 2.6 x 2.4 cm mass in the right adrenal gland. There may have been a small nodule previously, but none of the size.  Kidneys and ureters: There is a 4 cm solid mass in the region of the left lower pole. Although touching the lower pole, it does not appear to arise from the kidney. There are bilateral renal cysts, stable from prior. Bilateral nephrolithiasis, nonobstructive, with the largest on on the left measuring 6 mm in the lower pole and the largest on the right measuring 5 mm.  Bladder: No acute abnormality  Reproductive: Prostate enlargement  Bowel: No evidence of primary mass.  Retroperitoneum: 4 cm left perirenal mass as above. There is also a 6 mm posterior pararenal space nodule just above the right iliac crest, measuring 7 mm. There is a 2 cm mass just deep to the right  inferior epigastric neurovascular bundle measuring 2 cm. This is likely extraperitoneal. There is contact with bowel which is otherwise unremarkable.  Peritoneum: No free fluid or gas.  Vascular: No acute abnormality.  OSSEOUS: 4 cm aggressive bone lesion in the left iliac crest comminuted anterior superior iliac spine.  Remote appearing T12 compression fracture with distracted, remote appearing T11 spinous process fracture. Grade 2 L5-S1 anterolisthesis.  IMPRESSION Osseous and intra-abdominal metastatic disease. The primary source is not definitively identified in the chest or abdomen. The intra-abdominal pattern of spread is to the retroperitoneum (non-nodal) and right adrenal gland. Is there any indication of melanoma on exam?  SIGNATURE  Electronically  Signed   By: Jorje Guild M.D.   On: 06/17/2013 08:25   Mr Brain Wo Contrast  06/12/2013   CLINICAL DATA:  Right-sided weakness, right facial weakness, slurred speech. Stroke.  EXAM: MRI HEAD WITHOUT CONTRAST  MRA HEAD WITHOUT CONTRAST  TECHNIQUE: Multiplanar, multiecho pulse sequences of the brain and surrounding structures were obtained without intravenous contrast. Angiographic images of the head were obtained using MRA technique without contrast.  COMPARISON:  Head CT 06/11/2013 and MRI/ MRA 07/30/2012  FINDINGS: MRI HEAD FINDINGS  There is a 2.1 x 1.9 cm focus of heterogeneous, predominantly hyperintense T2 signal centered in the left upper pons/lower midbrain. There is a thin rim of slightly increased diffusion weighted signal at its periphery which may demonstrate minimally restricted diffusion. There is facilitated diffusion in the central portion of the lesion. There is no intracranial hemorrhage. The left pons is mildly expanded, and there is T2/FLAIR hyperintensity throughout the pons and extending into the left middle cerebellar peduncle and left greater than right cerebral peduncle is compatible with edema. There is a small amount of edema in the posterior limb of internal capsule on the left as well.  There is moderate cerebral atrophy. Small, scattered foci of T2 hyperintensity are present in the subcortical and deep cerebral white matter bilaterally, not significantly changed and compatible with mild chronic small vessel ischemic disease. There is no midline shift or extra-axial fluid collection. Orbits are unremarkable. Paranasal sinuses and mastoid air cells are clear.  MRA HEAD FINDINGS  Images are moderately degraded by motion. Flow is again not identified in the distal right vertebral artery, consistent with proximal occlusion. The visualized distal left vertebral artery is patent without high-grade stenosis. PICAs are not well visualized. Basilar artery is patent without evidence of  significant stenosis. SCA origins are grossly patent. There is a fetal origin of the right PCA. PCAs are otherwise grossly unremarkable.  Internal carotid arteries are patent from skullbase to carotid termini. Bilateral carotid siphon irregularity is again seen. There is again evidence of high-grade stenosis in the anterior left cavernous ICA. The left A1 segment is again noted to be dominant with the azygos ACA anatomy. Relatively symmetric flow is identified within the MCAs with mild branch vessel irregularity bilaterally. No intracranial aneurysm is identified.  IMPRESSION: 1. 2 cm heterogeneous lesion involving the left upper pons/lower midbrain with surrounding edema. Findings may reflect a subacute infarct, however postcontrast imaging and follow-up MRI are recommended to exclude a mass. 2. Mild chronic small vessel ischemic disease. 3. Motion degraded MRA without evidence of new intracranial arterial occlusion. 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   Electronically Signed   By: Logan Bores   On: 06/12/2013 14:37   Mr Brain W Contrast  06/17/2013   ADDENDUM REPORT: 06/17/2013 16:09  ADDENDUM: The patient was brought back 06/17/2013 for postcontrast axial imaging according to the [facility] protocol.  This is done for [facility] planning purposes. The left midbrain lesion is again noted. No additional lesions are evident. There is no significant interval change.   Electronically Signed   By: Lawrence Santiago M.D.   On: 06/17/2013 16:09   06/17/2013   CLINICAL DATA:  Brainstem mass.  EXAM: MRI HEAD WITH CONTRAST  TECHNIQUE: Multiplanar, multiecho pulse sequences of the brain and surrounding structures were obtained with intravenous contrast.  COMPARISON:  Noncontrast MRI brain 06/12/2013  CONTRAST:  45mL MULTIHANCE GADOBENATE DIMEGLUMINE 529 MG/ML IV SOLN  FINDINGS: The lesion in the left midbrain demonstrates predominantly peripheral enhancement  measuring 2.1 x 2.1 x 1.7 cm. Surrounding vasogenic edema is again seen. The edema extends superiorly in the left cerebral peduncle and along white matter tracts. There is also edema extending inferiorly in the brainstem and left cerebellar peduncle. The enhancement characteristics are most typical of a mass. 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 A focal metastasis is also considered. This does not have the appearance of an infarct. Scattered white matter changes are again noted bilaterally.  IMPRESSION: 1. 2.1 x 2.1 x 1.7 cm peripherally enhancing mass lesion. 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 Metastatic disease is also considered. 2. Atrophy and white matter disease is again noted.  Electronically Signed: By: Lawrence Santiago M.D. On: 06/15/2013 15:54   Ct Abdomen Pelvis W Contrast  06/17/2013   CLINICAL DATA Brain mass.  Rule out metastases.  EXAM CT CHEST, ABDOMEN, AND PELVIS WITH CONTRAST  TECHNIQUE Multidetector CT imaging of the chest, abdomen and pelvis was performed following the standard protocol during bolus administration of intravenous contrast.  CONTRAST 155mL OMNIPAQUE IOHEXOL 300 MG/ML  SOLN  COMPARISON Chest CT 04/07/2011.  Abdominal CT 03/11/2011  FINDINGS CT CHEST FINDINGS  THORACIC INLET/BODY WALL:  No acute abnormality.  MEDIASTINUM:  Cardiomegaly, with notable left atrial enlargement. Extensive atherosclerosis of the coronary arteries, status post CABG. The LIMA graft is not well opacified, but the saphenous vein grafts enhance well. No evidence of acute vascular abnormality.  LUNG WINDOWS:  Low volume lungs. There is chronic cysts in the peripheral right lower lung, with surrounding fibrosis. Bandlike opacities at the right base, with right that pad elevation, most consistent with atelectasis. No metastatic disease.  OSSEOUS:  No acute fracture.  No suspicious lytic or  blastic lesions.  CT ABDOMEN AND PELVIS FINDINGS  ABDOMEN/PELVIS:  Liver: 4 cm cyst in the anterior liver. Caudate lobe enlargement without definitive features of cirrhosis on the liver surface.  Biliary: Cholecystectomy.  Pancreas: Unremarkable.  Spleen: Mild granulomatous changes.  Adrenals: 2.6 x 2.4 cm mass in the right adrenal gland. There may have been a small nodule previously, but none of the size.  Kidneys and ureters: There is a 4 cm solid mass in the region of the left lower pole. Although touching the lower pole, it does not appear to arise from the kidney. There are bilateral renal cysts, stable from prior. Bilateral nephrolithiasis, nonobstructive, with the largest on on the left measuring 6 mm in the lower pole and the largest on the right measuring 5 mm.  Bladder: No acute abnormality  Reproductive: Prostate enlargement  Bowel: No evidence of primary mass.  Retroperitoneum: 4 cm left perirenal mass as above. There is also a 6 mm posterior pararenal  space nodule just above the right iliac crest, measuring 7 mm. There is a 2 cm mass just deep to the right inferior epigastric neurovascular bundle measuring 2 cm. This is likely extraperitoneal. There is contact with bowel which is otherwise unremarkable.  Peritoneum: No free fluid or gas.  Vascular: No acute abnormality.  OSSEOUS: 4 cm aggressive bone lesion in the left iliac crest comminuted anterior superior iliac spine.  Remote appearing T12 compression fracture with distracted, remote appearing T11 spinous process fracture. Grade 2 L5-S1 anterolisthesis.  IMPRESSION Osseous and intra-abdominal metastatic disease. The primary source is not definitively identified in the chest or abdomen. The intra-abdominal pattern of spread is to the retroperitoneum (non-nodal) and right adrenal gland. Is there any indication of melanoma on exam?  SIGNATURE  Electronically Signed   By: Tiburcio Pea M.D.   On: 06/17/2013 08:25   Mr Maxine Glenn Head/brain Wo  Cm  06/12/2013   CLINICAL DATA:  Right-sided weakness, right facial weakness, slurred speech. Stroke.  EXAM: MRI HEAD WITHOUT CONTRAST  MRA HEAD WITHOUT CONTRAST  TECHNIQUE: Multiplanar, multiecho pulse sequences of the brain and surrounding structures were obtained without intravenous contrast. Angiographic images of the head were obtained using MRA technique without contrast.  COMPARISON:  Head CT 06/11/2013 and MRI/ MRA 07/30/2012  FINDINGS: MRI HEAD FINDINGS  There is a 2.1 x 1.9 cm focus of heterogeneous, predominantly hyperintense T2 signal centered in the left upper pons/lower midbrain. There is a thin rim of slightly increased diffusion weighted signal at its periphery which may demonstrate minimally restricted diffusion. There is facilitated diffusion in the central portion of the lesion. There is no intracranial hemorrhage. The left pons is mildly expanded, and there is T2/FLAIR hyperintensity throughout the pons and extending into the left middle cerebellar peduncle and left greater than right cerebral peduncle is compatible with edema. There is a small amount of edema in the posterior limb of internal capsule on the left as well.  There is moderate cerebral atrophy. Small, scattered foci of T2 hyperintensity are present in the subcortical and deep cerebral white matter bilaterally, not significantly changed and compatible with mild chronic small vessel ischemic disease. There is no midline shift or extra-axial fluid collection. Orbits are unremarkable. Paranasal sinuses and mastoid air cells are clear.  MRA HEAD FINDINGS  Images are moderately degraded by motion. Flow is again not identified in the distal right vertebral artery, consistent with proximal occlusion. The visualized distal left vertebral artery is patent without high-grade stenosis. PICAs are not well visualized. Basilar artery is patent without evidence of significant stenosis. SCA origins are grossly patent. There is a fetal origin of the  right PCA. PCAs are otherwise grossly unremarkable.  Internal carotid arteries are patent from skullbase to carotid termini. Bilateral carotid siphon irregularity is again seen. There is again evidence of high-grade stenosis in the anterior left cavernous ICA. The left A1 segment is again noted to be dominant with the azygos ACA anatomy. Relatively symmetric flow is identified within the MCAs with mild branch vessel irregularity bilaterally. No intracranial aneurysm is identified.  IMPRESSION: 1. 2 cm heterogeneous lesion involving the left upper pons/lower midbrain with surrounding edema. Findings may reflect a subacute infarct, however postcontrast imaging and follow-up MRI are recommended to exclude a mass. 2. Mild chronic small vessel ischemic disease. 3. Motion degraded MRA without evidence of new intracranial arterial occlusion. 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   Electronically Signed  By: Logan Bores   On: 06/12/2013 14:37     Dg Chest 2 View  06/11/2013   CLINICAL DATA:  Cough and shortness of breath.  EXAM: CHEST  2 VIEW  COMPARISON:  06/08/2013 and 05/03/2013  FINDINGS: Chronic elevation of the right hemidiaphragm. Again noted are postsurgical changes in the left shoulder consistent with a shoulder arthroplasty. Patient also has median sternotomy wires. Chronic linear densities in the right lung and anterior chest on the lateral view. No evidence for acute airspace disease. There is volume loss at the left lung base. Heart size is stable. Again noted is a compression fracture in the lower thoracic spine.  IMPRESSION: Chronic lung changes with left basilar atelectasis. Minimal change from the previous examination.   Electronically Signed   By: Markus Daft M.D.   On: 06/11/2013 12:12   Dg Chest 2 View  06/08/2013   CLINICAL DATA:  Short of breath, cough, congestion and fatigue  EXAM: CHEST  2 VIEW  COMPARISON:  Portable  chest x-ray of 05/04/2013 and 05/03/2013  FINDINGS: No active infiltrate or effusion is seen. Chronic elevation of the right hemidiaphragm is again noted. The heart is mildly enlarged and stable. Median sternotomy sutures are intact from prior CABG. A left shoulder replacement is present and there are degenerative changes of the right shoulder. However, on the lateral view, there is approximately 40% compression deformity of either T12 or L1 which has increased somewhat since the prior lateral chest x-ray.  IMPRESSION: 1. Furthder compression of either T12 or L1 when compared to the lateral chest x-ray of 05/03/2013, now approaching 40%. 2. Stable cardiomegaly and chronic elevation of the right hemidiaphragm.   Electronically Signed   By: Ivar Drape M.D.   On: 06/08/2013 16:28   Ct Head Wo Contrast  06/11/2013   CLINICAL DATA:  Right-sided weakness.  History of prior infarct.  EXAM: CT HEAD WITHOUT CONTRAST  TECHNIQUE: Contiguous axial images were obtained from the base of the skull through the vertex without intravenous contrast.  COMPARISON:  Brain MRI 07/30/2012.  Head CT scan 06/08/2013.  FINDINGS: Atrophy and chronic microvascular ischemic change are again seen. The left cerebral peduncle is hypoattenuating and swollen relative to the right consistent with subacute infarct. More inferiorly, there is hypoattenuation in the pons. These findings are unchanged since the most recent prior exam. The brain is atrophic with chronic microvascular ischemic change. No hemorrhage, midline shift or abnormal extra-axial fluid collection is identified. There is no hydrocephalus or pneumocephalus. The calvarium is intact.  IMPRESSION: Findings consistent with subacute infarct in the left cerebral peduncle and pons. The appearance is not markedly changed since the most recent head CT. Negative for hemorrhage.   Electronically Signed   By: Inge Rise M.D.   On: 06/11/2013 12:50   Ct Head Wo Contrast  06/08/2013    CLINICAL DATA:  Right arm weakness and facial droop  EXAM: CT HEAD WITHOUT CONTRAST  TECHNIQUE: Contiguous axial images were obtained from the base of the skull through the vertex without intravenous contrast.  COMPARISON:  MR HEAD W/O CM dated 07/30/2012  FINDINGS: There is a new rounded low-density lesion in the left midbrain measuring 10 mm (image 12, series 32). There is extensive deep white matter hypodensities. No evidence of cerebral cortical infarction. No intracranial hemorrhage. No midline shift or mass effect. No hydrocephalus.  Paranasal sinuses and mastoid air cells are clear.  IMPRESSION: 1. Concern for left midbrain infarction.  Consider brain MRI. 2. Extensive  white matter microvascular disease and cortical atrophy to similar prior. These results will be called to the ordering clinician or representative by the Radiologist Assistant, and communication documented in the PACS Dashboard.   Electronically Signed   By: Suzy Bouchard M.D.   On: 06/08/2013 16:56   Ct Chest W Contrast  06/17/2013   CLINICAL DATA Brain mass.  Rule out metastases.  EXAM CT CHEST, ABDOMEN, AND PELVIS WITH CONTRAST  TECHNIQUE Multidetector CT imaging of the chest, abdomen and pelvis was performed following the standard protocol during bolus administration of intravenous contrast.  CONTRAST 136mL OMNIPAQUE IOHEXOL 300 MG/ML  SOLN  COMPARISON Chest CT 04/07/2011.  Abdominal CT 03/11/2011  FINDINGS CT CHEST FINDINGS  THORACIC INLET/BODY WALL:  No acute abnormality.  MEDIASTINUM:  Cardiomegaly, with notable left atrial enlargement. Extensive atherosclerosis of the coronary arteries, status post CABG. The LIMA graft is not well opacified, but the saphenous vein grafts enhance well. No evidence of acute vascular abnormality.  LUNG WINDOWS:  Low volume lungs. There is chronic cysts in the peripheral right lower lung, with surrounding fibrosis. Bandlike opacities at the right base, with right that pad elevation, most consistent  with atelectasis. No metastatic disease.  OSSEOUS:  No acute fracture.  No suspicious lytic or blastic lesions.  CT ABDOMEN AND PELVIS FINDINGS  ABDOMEN/PELVIS:  Liver: 4 cm cyst in the anterior liver. Caudate lobe enlargement without definitive features of cirrhosis on the liver surface.  Biliary: Cholecystectomy.  Pancreas: Unremarkable.  Spleen: Mild granulomatous changes.  Adrenals: 2.6 x 2.4 cm mass in the right adrenal gland. There may have been a small nodule previously, but none of the size.  Kidneys and ureters: There is a 4 cm solid mass in the region of the left lower pole. Although touching the lower pole, it does not appear to arise from the kidney. There are bilateral renal cysts, stable from prior. Bilateral nephrolithiasis, nonobstructive, with the largest on on the left measuring 6 mm in the lower pole and the largest on the right measuring 5 mm.  Bladder: No acute abnormality  Reproductive: Prostate enlargement  Bowel: No evidence of primary mass.  Retroperitoneum: 4 cm left perirenal mass as above. There is also a 6 mm posterior pararenal space nodule just above the right iliac crest, measuring 7 mm. There is a 2 cm mass just deep to the right inferior epigastric neurovascular bundle measuring 2 cm. This is likely extraperitoneal. There is contact with bowel which is otherwise unremarkable.  Peritoneum: No free fluid or gas.  Vascular: No acute abnormality.  OSSEOUS: 4 cm aggressive bone lesion in the left iliac crest comminuted anterior superior iliac spine.  Remote appearing T12 compression fracture with distracted, remote appearing T11 spinous process fracture. Grade 2 L5-S1 anterolisthesis.  IMPRESSION Osseous and intra-abdominal metastatic disease. The primary source is not definitively identified in the chest or abdomen. The intra-abdominal pattern of spread is to the retroperitoneum (non-nodal) and right adrenal gland. Is there any indication of melanoma on exam?  SIGNATURE  Electronically  Signed   By: Jorje Guild M.D.   On: 06/17/2013 08:25   Mr Brain Wo Contrast  06/12/2013   CLINICAL DATA:  Right-sided weakness, right facial weakness, slurred speech. Stroke.  EXAM: MRI HEAD WITHOUT CONTRAST  MRA HEAD WITHOUT CONTRAST  TECHNIQUE: Multiplanar, multiecho pulse sequences of the brain and surrounding structures were obtained without intravenous contrast. Angiographic images of the head were obtained using MRA technique without contrast.  COMPARISON:  Head CT 06/11/2013 and  MRI/ MRA 07/30/2012  FINDINGS: MRI HEAD FINDINGS  There is a 2.1 x 1.9 cm focus of heterogeneous, predominantly hyperintense T2 signal centered in the left upper pons/lower midbrain. There is a thin rim of slightly increased diffusion weighted signal at its periphery which may demonstrate minimally restricted diffusion. There is facilitated diffusion in the central portion of the lesion. There is no intracranial hemorrhage. The left pons is mildly expanded, and there is T2/FLAIR hyperintensity throughout the pons and extending into the left middle cerebellar peduncle and left greater than right cerebral peduncle is compatible with edema. There is a small amount of edema in the posterior limb of internal capsule on the left as well.  There is moderate cerebral atrophy. Small, scattered foci of T2 hyperintensity are present in the subcortical and deep cerebral white matter bilaterally, not significantly changed and compatible with mild chronic small vessel ischemic disease. There is no midline shift or extra-axial fluid collection. Orbits are unremarkable. Paranasal sinuses and mastoid air cells are clear.  MRA HEAD FINDINGS  Images are moderately degraded by motion. Flow is again not identified in the distal right vertebral artery, consistent with proximal occlusion. The visualized distal left vertebral artery is patent without high-grade stenosis. PICAs are not well visualized. Basilar artery is patent without evidence of  significant stenosis. SCA origins are grossly patent. There is a fetal origin of the right PCA. PCAs are otherwise grossly unremarkable.  Internal carotid arteries are patent from skullbase to carotid termini. Bilateral carotid siphon irregularity is again seen. There is again evidence of high-grade stenosis in the anterior left cavernous ICA. The left A1 segment is again noted to be dominant with the azygos ACA anatomy. Relatively symmetric flow is identified within the MCAs with mild branch vessel irregularity bilaterally. No intracranial aneurysm is identified.  IMPRESSION: 1. 2 cm heterogeneous lesion involving the left upper pons/lower midbrain with surrounding edema. Findings may reflect a subacute infarct, however postcontrast imaging and follow-up MRI are recommended to exclude a mass. 2. Mild chronic small vessel ischemic disease. 3. Motion degraded MRA without evidence of new intracranial arterial occlusion. 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   Electronically Signed   By: Logan Bores   On: 06/12/2013 14:37   Mr Brain W Contrast  06/17/2013   ADDENDUM REPORT: 06/17/2013 16:09  ADDENDUM: The patient was brought back 06/17/2013 for postcontrast axial imaging according to the [facility] protocol.  This is done for [facility] planning purposes. The left midbrain lesion is again noted. No additional lesions are evident. There is no significant interval change.   Electronically Signed   By: Lawrence Santiago M.D.   On: 06/17/2013 16:09   06/17/2013   CLINICAL DATA:  Brainstem mass.  EXAM: MRI HEAD WITH CONTRAST  TECHNIQUE: Multiplanar, multiecho pulse sequences of the brain and surrounding structures were obtained with intravenous contrast.  COMPARISON:  Noncontrast MRI brain 06/12/2013  CONTRAST:  35mL MULTIHANCE GADOBENATE DIMEGLUMINE 529 MG/ML IV SOLN  FINDINGS: The lesion in the left midbrain demonstrates predominantly peripheral enhancement  measuring 2.1 x 2.1 x 1.7 cm. Surrounding vasogenic edema is again seen. The edema extends superiorly in the left cerebral peduncle and along white matter tracts. There is also edema extending inferiorly in the brainstem and left cerebellar peduncle. The enhancement characteristics are most typical of a mass. 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 A focal metastasis is also considered.  This does not have the appearance of an infarct. Scattered white matter changes are again noted bilaterally.  IMPRESSION: 1. 2.1 x 2.1 x 1.7 cm peripherally enhancing mass lesion. 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 Metastatic disease is also considered. 2. Atrophy and white matter disease is again noted.  Electronically Signed: By: Lawrence Santiago M.D. On: 06/15/2013 15:54   Ct Abdomen Pelvis W Contrast  06/17/2013   CLINICAL DATA Brain mass.  Rule out metastases.  EXAM CT CHEST, ABDOMEN, AND PELVIS WITH CONTRAST  TECHNIQUE Multidetector CT imaging of the chest, abdomen and pelvis was performed following the standard protocol during bolus administration of intravenous contrast.  CONTRAST 134mL OMNIPAQUE IOHEXOL 300 MG/ML  SOLN  COMPARISON Chest CT 04/07/2011.  Abdominal CT 03/11/2011  FINDINGS CT CHEST FINDINGS  THORACIC INLET/BODY WALL:  No acute abnormality.  MEDIASTINUM:  Cardiomegaly, with notable left atrial enlargement. Extensive atherosclerosis of the coronary arteries, status post CABG. The LIMA graft is not well opacified, but the saphenous vein grafts enhance well. No evidence of acute vascular abnormality.  LUNG WINDOWS:  Low volume lungs. There is chronic cysts in the peripheral right lower lung, with surrounding fibrosis. Bandlike opacities at the right base, with right that pad elevation, most consistent with atelectasis. No metastatic disease.  OSSEOUS:  No acute fracture.  No suspicious lytic or  blastic lesions.  CT ABDOMEN AND PELVIS FINDINGS  ABDOMEN/PELVIS:  Liver: 4 cm cyst in the anterior liver. Caudate lobe enlargement without definitive features of cirrhosis on the liver surface.  Biliary: Cholecystectomy.  Pancreas: Unremarkable.  Spleen: Mild granulomatous changes.  Adrenals: 2.6 x 2.4 cm mass in the right adrenal gland. There may have been a small nodule previously, but none of the size.  Kidneys and ureters: There is a 4 cm solid mass in the region of the left lower pole. Although touching the lower pole, it does not appear to arise from the kidney. There are bilateral renal cysts, stable from prior. Bilateral nephrolithiasis, nonobstructive, with the largest on on the left measuring 6 mm in the lower pole and the largest on the right measuring 5 mm.  Bladder: No acute abnormality  Reproductive: Prostate enlargement  Bowel: No evidence of primary mass.  Retroperitoneum: 4 cm left perirenal mass as above. There is also a 6 mm posterior pararenal space nodule just above the right iliac crest, measuring 7 mm. There is a 2 cm mass just deep to the right inferior epigastric neurovascular bundle measuring 2 cm. This is likely extraperitoneal. There is contact with bowel which is otherwise unremarkable.  Peritoneum: No free fluid or gas.  Vascular: No acute abnormality.  OSSEOUS: 4 cm aggressive bone lesion in the left iliac crest comminuted anterior superior iliac spine.  Remote appearing T12 compression fracture with distracted, remote appearing T11 spinous process fracture. Grade 2 L5-S1 anterolisthesis.  IMPRESSION Osseous and intra-abdominal metastatic disease. The primary source is not definitively identified in the chest or abdomen. The intra-abdominal pattern of spread is to the retroperitoneum (non-nodal) and right adrenal gland. Is there any indication of melanoma on exam?  SIGNATURE  Electronically Signed   By: Jorje Guild M.D.   On: 06/17/2013 08:25   Mr Jodene Nam Head/brain Wo  Cm  06/12/2013   CLINICAL DATA:  Right-sided weakness, right facial weakness, slurred speech. Stroke.  EXAM: MRI HEAD WITHOUT CONTRAST  MRA HEAD WITHOUT CONTRAST  TECHNIQUE: Multiplanar, multiecho pulse sequences of the brain and surrounding structures  were obtained without intravenous contrast. Angiographic images of the head were obtained using MRA technique without contrast.  COMPARISON:  Head CT 06/11/2013 and MRI/ MRA 07/30/2012  FINDINGS: MRI HEAD FINDINGS  There is a 2.1 x 1.9 cm focus of heterogeneous, predominantly hyperintense T2 signal centered in the left upper pons/lower midbrain. There is a thin rim of slightly increased diffusion weighted signal at its periphery which may demonstrate minimally restricted diffusion. There is facilitated diffusion in the central portion of the lesion. There is no intracranial hemorrhage. The left pons is mildly expanded, and there is T2/FLAIR hyperintensity throughout the pons and extending into the left middle cerebellar peduncle and left greater than right cerebral peduncle is compatible with edema. There is a small amount of edema in the posterior limb of internal capsule on the left as well.  There is moderate cerebral atrophy. Small, scattered foci of T2 hyperintensity are present in the subcortical and deep cerebral white matter bilaterally, not significantly changed and compatible with mild chronic small vessel ischemic disease. There is no midline shift or extra-axial fluid collection. Orbits are unremarkable. Paranasal sinuses and mastoid air cells are clear.  MRA HEAD FINDINGS  Images are moderately degraded by motion. Flow is again not identified in the distal right vertebral artery, consistent with proximal occlusion. The visualized distal left vertebral artery is patent without high-grade stenosis. PICAs are not well visualized. Basilar artery is patent without evidence of significant stenosis. SCA origins are grossly patent. There is a fetal origin of the  right PCA. PCAs are otherwise grossly unremarkable.  Internal carotid arteries are patent from skullbase to carotid termini. Bilateral carotid siphon irregularity is again seen. There is again evidence of high-grade stenosis in the anterior left cavernous ICA. The left A1 segment is again noted to be dominant with the azygos ACA anatomy. Relatively symmetric flow is identified within the MCAs with mild branch vessel irregularity bilaterally. No intracranial aneurysm is identified.  IMPRESSION: 1. 2 cm heterogeneous lesion involving the left upper pons/lower midbrain with surrounding edema. Findings may reflect a subacute infarct, however postcontrast imaging and follow-up MRI are recommended to exclude a mass. 2. Mild chronic small vessel ischemic disease. 3. Motion degraded MRA without evidence of new intracranial arterial occlusion. 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   Electronically Signed   By: Logan Bores   On: 06/12/2013 14:37    Assessment and Plan:  80 year old male with:  1. Recurrent Melanoma: with brain, bone and abdominal mets. Patient will undergo SRS of midbrain lesion. He also has painful right hip metastasis and will need to be radiated. I have discussed this with Dr. Sherwood Gambler and Dr. Benjie Karvonen  2. Further systemic treatment recommendation will most be at [facility]. Patients daughter did speak to Dr. Ahmed Prima assistant with hope of having the patient seen by him as soon as possible.  3. Discussed everything with patient and his daughter.  The length of time of the face-to-face encounter was 60 minutes. More than 50% of time was spent counseling and coordination of care.  Marcy Panning, MD Medical/Oncology [facility] [phone] (beeper) [phone] (Office)

## 2013-06-18 NOTE — Telephone Encounter (Signed)
Called [facility],spoke with RN Mae, infomrd her patint is eating [place] sandwich, chips and drinking coffee, may be over 1 hour before [facility] can get [place] milk to take back to the hospital, no pain, he's doing well,  RN to let family know 12:51 PM

## 2013-06-18 NOTE — Progress Notes (Signed)
Subjective: Patient sitting up with the edge of the bed, without complaints. Spoke with Dr. Humphrey Rolls last night about her assessment and recommendations. She spoke with the patient's family regarding treating his intra-abdominal and osseous metastases, and their preference is to return to Dr. Freddi Che at [facility] for further systemic treatment. They're making arrangements for this. At this point he scheduled for simulation for his cranial SRS treatment today at the radiation oncology department at the [facility], with the treatment scheduled for Wednesday, March 18.  I also spoke with Dr. Grandville Silos this morning regarding plans for continued treatment and care. The question regarding disposition arose, and I spoke at length of the bedside with the patient and his family, including his daughter, Gus Height. After a lengthy discussion the patient has agreed to be discharged to [facility] for further rehabilitation at that skilled nursing facility. He will be able to go for his [facility] treatment as well as his further consultation with Dr. Freddi Che from [facility]. Dr. Grandville Silos will make the necessary arrangements for transfer to the SNF, after his simulation today.  He will need to continue on Decadron 4 mg by mouth twice a day, and I will give him and his family further instructions regarding Decadron dosing at the time of his [facility] treatment.  Objective: Vital signs in last 24 hours: Filed Vitals:   06/17/13 1820 06/17/13 2041 06/17/13 2111 06/18/13 0247  BP: 99/56  112/41 132/61  Pulse: 89  92 67  Temp: 97.2 F (36.2 C)  97.6 F (36.4 C) 98.3 F (36.8 C)  TempSrc: Oral  Oral Oral  Resp: 20  20 20   Height:      Weight:      SpO2: 96% 98% 93% 100%    Intake/Output from previous day: 03/12 0701 - 03/13 0700 In: 240 [P.O.:240] Out: 200 [Urine:200] Intake/Output this shift:    Physical Exam:  Awake and alert, oriented. Following commands. Speech fluent. No significant change in right  hemiparesis.  CBC  Recent Labs  06/17/13 0513 06/18/13 0502  WBC 6.9 7.1  HGB 11.3* 11.0*  HCT 34.8* 34.5*  PLT 222 208   BMET  Recent Labs  06/17/13 0513 06/18/13 0502  NA 135* 138  K 4.4 4.7  CL 96 100  CO2 24 26  GLUCOSE 126* 130*  BUN 16 20  CREATININE 0.83 0.90  CALCIUM 9.8 9.4   ABG    Component Value Date/Time   TCO2 30 06/11/2013 1150    Studies/Results: Ct Chest W Contrast  06/17/2013   CLINICAL DATA Brain mass.  Rule out metastases.  EXAM CT CHEST, ABDOMEN, AND PELVIS WITH CONTRAST  TECHNIQUE Multidetector CT imaging of the chest, abdomen and pelvis was performed following the standard protocol during bolus administration of intravenous contrast.  CONTRAST 172mL OMNIPAQUE IOHEXOL 300 MG/ML  SOLN  COMPARISON Chest CT 04/07/2011.  Abdominal CT 03/11/2011  FINDINGS CT CHEST FINDINGS  THORACIC INLET/BODY WALL:  No acute abnormality.  MEDIASTINUM:  Cardiomegaly, with notable left atrial enlargement. Extensive atherosclerosis of the coronary arteries, status post CABG. The LIMA graft is not well opacified, but the saphenous vein grafts enhance well. No evidence of acute vascular abnormality.  LUNG WINDOWS:  Low volume lungs. There is chronic cysts in the peripheral right lower lung, with surrounding fibrosis. Bandlike opacities at the right base, with right that pad elevation, most consistent with atelectasis. No metastatic disease.  OSSEOUS:  No acute fracture.  No suspicious lytic or blastic lesions.  CT  ABDOMEN AND PELVIS FINDINGS  ABDOMEN/PELVIS:  Liver: 4 cm cyst in the anterior liver. Caudate lobe enlargement without definitive features of cirrhosis on the liver surface.  Biliary: Cholecystectomy.  Pancreas: Unremarkable.  Spleen: Mild granulomatous changes.  Adrenals: 2.6 x 2.4 cm mass in the right adrenal gland. There may have been a small nodule previously, but none of the size.  Kidneys and ureters: There is a 4 cm solid mass in the region of the left lower pole.  Although touching the lower pole, it does not appear to arise from the kidney. There are bilateral renal cysts, stable from prior. Bilateral nephrolithiasis, nonobstructive, with the largest on on the left measuring 6 mm in the lower pole and the largest on the right measuring 5 mm.  Bladder: No acute abnormality  Reproductive: Prostate enlargement  Bowel: No evidence of primary mass.  Retroperitoneum: 4 cm left perirenal mass as above. There is also a 6 mm posterior pararenal space nodule just above the right iliac crest, measuring 7 mm. There is a 2 cm mass just deep to the right inferior epigastric neurovascular bundle measuring 2 cm. This is likely extraperitoneal. There is contact with bowel which is otherwise unremarkable.  Peritoneum: No free fluid or gas.  Vascular: No acute abnormality.  OSSEOUS: 4 cm aggressive bone lesion in the left iliac crest comminuted anterior superior iliac spine.  Remote appearing T12 compression fracture with distracted, remote appearing T11 spinous process fracture. Grade 2 L5-S1 anterolisthesis.  IMPRESSION Osseous and intra-abdominal metastatic disease. The primary source is not definitively identified in the chest or abdomen. The intra-abdominal pattern of spread is to the retroperitoneum (non-nodal) and right adrenal gland. Is there any indication of melanoma on exam?  SIGNATURE  Electronically Signed   By: Jorje Guild M.D.   On: 06/17/2013 08:25   Ct Abdomen Pelvis W Contrast  06/17/2013   CLINICAL DATA Brain mass.  Rule out metastases.  EXAM CT CHEST, ABDOMEN, AND PELVIS WITH CONTRAST  TECHNIQUE Multidetector CT imaging of the chest, abdomen and pelvis was performed following the standard protocol during bolus administration of intravenous contrast.  CONTRAST 199mL OMNIPAQUE IOHEXOL 300 MG/ML  SOLN  COMPARISON Chest CT 04/07/2011.  Abdominal CT 03/11/2011  FINDINGS CT CHEST FINDINGS  THORACIC INLET/BODY WALL:  No acute abnormality.  MEDIASTINUM:  Cardiomegaly, with  notable left atrial enlargement. Extensive atherosclerosis of the coronary arteries, status post CABG. The LIMA graft is not well opacified, but the saphenous vein grafts enhance well. No evidence of acute vascular abnormality.  LUNG WINDOWS:  Low volume lungs. There is chronic cysts in the peripheral right lower lung, with surrounding fibrosis. Bandlike opacities at the right base, with right that pad elevation, most consistent with atelectasis. No metastatic disease.  OSSEOUS:  No acute fracture.  No suspicious lytic or blastic lesions.  CT ABDOMEN AND PELVIS FINDINGS  ABDOMEN/PELVIS:  Liver: 4 cm cyst in the anterior liver. Caudate lobe enlargement without definitive features of cirrhosis on the liver surface.  Biliary: Cholecystectomy.  Pancreas: Unremarkable.  Spleen: Mild granulomatous changes.  Adrenals: 2.6 x 2.4 cm mass in the right adrenal gland. There may have been a small nodule previously, but none of the size.  Kidneys and ureters: There is a 4 cm solid mass in the region of the left lower pole. Although touching the lower pole, it does not appear to arise from the kidney. There are bilateral renal cysts, stable from prior. Bilateral nephrolithiasis, nonobstructive, with the largest on on the left measuring  6 mm in the lower pole and the largest on the right measuring 5 mm.  Bladder: No acute abnormality  Reproductive: Prostate enlargement  Bowel: No evidence of primary mass.  Retroperitoneum: 4 cm left perirenal mass as above. There is also a 6 mm posterior pararenal space nodule just above the right iliac crest, measuring 7 mm. There is a 2 cm mass just deep to the right inferior epigastric neurovascular bundle measuring 2 cm. This is likely extraperitoneal. There is contact with bowel which is otherwise unremarkable.  Peritoneum: No free fluid or gas.  Vascular: No acute abnormality.  OSSEOUS: 4 cm aggressive bone lesion in the left iliac crest comminuted anterior superior iliac spine.  Remote  appearing T12 compression fracture with distracted, remote appearing T11 spinous process fracture. Grade 2 L5-S1 anterolisthesis.  IMPRESSION Osseous and intra-abdominal metastatic disease. The primary source is not definitively identified in the chest or abdomen. The intra-abdominal pattern of spread is to the retroperitoneum (non-nodal) and right adrenal gland. Is there any indication of melanoma on exam?  SIGNATURE  Electronically Signed   By: Jorje Guild M.D.   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 Patient is scheduled for significant radiosurgery on Wednesday, March 18 at the radiation department at the [facility]. He is family are arranging for further oncologic care of his osseous and intra-abdominal metastasis with Dr. Freddi Che at [facility]. He is to continue Decadron 4 mg twice a day following discharge.   Hosie Spangle, MD 06/18/2013, 8:38 AM

## 2013-06-18 NOTE — Discharge Summary (Addendum)
Physician Discharge Summary  Gabriel Wood [id number] DOB: 09-14-1923 DOA: 06/11/2013  PCP: Simona Huh, MD  Admit date: 06/11/2013 Discharge date: 06/18/2013  Time spent: 70 minutes  Recommendations for Outpatient Follow-up:  1. Followup with Simona Huh, MD in 2 weeks. All followup basic metabolic profile need to be obtained to followup on patient's electrolytes and renal function. Patient blood pressure noted to be reassessed as well as his atrial fibrillation as his blood pressure medications were adjusted. Patient's Imdur will was discontinued and his beta blocker dose was decreased. 2. Followup with oncologist at [facility]. 3. Followup with Dr. Lisbeth Renshaw of radiation oncology on 06/23/13  Discharge Diagnoses:  Principal Problem:   Metastatic melanoma Active Problems:   Brain mass   Atrial fibrillation   CAD (coronary artery disease)   Hypertension   Hyperlipidemia   History of DVT of lower extremity   Stroke   CVA (cerebral infarction)   Discharge Condition: Stable and improved  Diet recommendation: Heart healthy  Filed Weights   06/13/13 0530  Weight: 67.6 kg (149 lb 0.5 oz)    History of present illness:  Gabriel Wood is a 80 y.o. male with past medical history significant for atrial fibrillation on chronic anticoagulation (aspirin and pradaxa), CAD, CHF, hypertension, TIA in the past and other medical problems as listed below who presents with above complaints. He states that about 2-3 weeks ago he first began having right-sided weakness which has progressively worsened to raise his unable to ambulate without assistance. He also states he's had some right facial weakness, slurred speech and drooling.He subsequently saw his PCP 3 days ago and a CT scan of his brain was ordered and it showed concern for left mid brain infarction. In the ED today CT showed findings consistent with subacute infarct in the left cerebral peduncle and pons. The patient denies visual  changes, dysphagia, chest pain shortness of breath melena and no hematochezia. Neuro was consulted and patient is admitted for further evaluation and management    Hospital Course:  #1 brain mass/probable recurrent metastatic melanoma  Probable likely etiology of patient's symptoms. Patient does have a prior history of melanoma question as to whether this is metastatic disease. CT of the chest negative for mets.,CT of the abdomen and pelvis with osseous and intra-abdominal metastatic disease. Patient has been seen by neurosurgery who has consulted oncology and radiation oncology for further evaluation. Discontinued Pradaxa. 3T SRS protocol MRI of the brain with gadolinium is currently pending. Continued on Decadron 4 mg twice a day. Patient was also seen by radiation oncology as well as oncology. Due to findings on CT of the abdomen and pelvis was felt this was likely a recurrent melanoma. It was recommended that patient will likely undergo SRS of the midbrain lesion as well as radiation of the right hip metastasis. Patient will subsequently followup with his oncologist at [facility]. Patient will followup with radiation oncology as well as with neurosurgery as outpatient. Patient be discharged on Decadron 4 mg twice daily. #2 progressive right hemiparesis  Likely secondary to problem brain mass and metastatic melanoma. Stroke workup was done with 2-D echo with no source of emboli. Carotid Dopplers with no significant hemodynamic carotid stenosis. MRI of the head did show a brain mass. Patient was initially maintained on aspirin and pradaxa as it was initially felt patient may have had a stroke. Patient was followed by neurology during the hospitalization. Neurosurgery was also consulted secondary to brain mass. Pradaxa has been discontinued. Patient has  been seen by oncology as well as radiation oncology and patient will undergo radiation. Patient was to followup with his oncologist at [facility]. Patient  has been placed on Decadron 4 mg daily. Patient will followup as outpatient.  #3 atrial fibrillation  Patient remained in sinus rhythm during the hospitalization. Patient was maintained on metoprolol for rate control. Metoprolol dose was decreased to 12.5 mg daily with good rate control. Pradaxa was discontinued secondary to brain mass which is highly vascular and concern for hemorrhagic conversion.  Patient will be discharged on aspirin.  #4 coronary artery disease  Stable. Patient imdur was discontinued secondary to borderline hypotension. Patient's Lopressor dose was decreased to 12.5 mg daily. Patient was maintained on his Lipitor. Patient will followup with PCP as outpatient.  #5. HTN  Blood pressure is borderline during the hospitalization. Patient imdur was discontinued and patient's metoprolol dose has been decreased with improvement with his borderline hypotension. This was need to be followed up upon as outpatient. #6 ???? history of lower extremity DVT  Patient with history of lower extremity DVT was on Pradaxa per epic. Patient and daughter however state does not recall ever having a DVT. Pradaxa has been discontinued. Follow.   Procedures: MRI with contrast brain 06/15/2013  MRI without contrast MRA head 06/12/2013  Carotid Dopplers 06/13/2013  2-D echo 06/12/2013  CT head 06/11/2013  CXR 06/11/2013  CT abdomen pelvis and chest 06/16/2013   Consultations: Neurosurgery: Dr. no dominant 06/16/2013  Neurology: Dr. Nicole Kindred 06/11/2013  Oncology Dr Humphrey Rolls 06/18/13 Radiation oncology: Dr. Lisbeth Renshaw 06/17/13   Discharge Exam: Filed Vitals:   06/18/13 0856  BP: 143/84  Pulse: 78  Temp: 98 F (36.7 C)  Resp: 20    General: NAD Cardiovascular: RRR Respiratory: CTAB  Discharge Instructions      Discharge Orders   Future Appointments Provider Department Dept Phone   06/24/2013 3:00 PM Marye Round, [facility] [phone]   Joint Appt  [place] [facility] [phone]   06/25/2013 3:00 PM [facility] [phone]   06/28/2013 3:30 PM [facility] [phone]   06/29/2013 3:00 PM [facility] [phone]   06/30/2013 10:50 AM [facility] [phone]   04/25/2014 9:00 AM [facility] [place] [phone]   Eat a light meal the night before the exam Nothing to eat or drink for at least 8 hours before exam No gum chewing, or smoking the morning of the exam. Please take your morning medications with small sips of water, especially blood pressure medication *Very Important* Please wear 2 piece clothing   04/25/2014 9:30 AM [facility] [place] [name] [phone]   04/25/2014 10:40 AM Sharmon Leyden Nickel, NP [facility] [phone]   Future Orders Complete By Expires   Diet - low sodium heart healthy  As directed    Discharge instructions  As directed    Comments:     Follow up with radiation as outpatient. Follow up with your oncologist at [facility]  As directed        Medication List    STOP taking these medications       dabigatran 150 MG Caps capsule  Commonly known as:  PRADAXA     isosorbide  mononitrate 60 MG 24 hr tablet  Commonly known as:  IMDUR      TAKE these medications       acetaminophen 650 MG CR tablet  Commonly known as:  TYLENOL  Take 1,300 mg by mouth every 8 (eight) hours as needed for pain.     albuterol 108 (90 BASE) MCG/ACT inhaler  Commonly known as:  PROVENTIL HFA;VENTOLIN HFA  Inhale 2 puffs into the lungs every 6 (six) hours as needed for wheezing or shortness of breath.     aspirin EC 81 MG tablet  Take 81 mg by mouth daily.      atorvastatin 40 MG tablet  Commonly known as:  LIPITOR  Take 40 mg by mouth daily.     budesonide-formoterol 160-4.5 MCG/ACT inhaler  Commonly known as:  SYMBICORT  Inhale 2 puffs into the lungs 2 (two) times daily.     citalopram 10 MG tablet  Commonly known as:  CELEXA  Take 10 mg by mouth daily.     dexamethasone 4 MG tablet  Commonly known as:  DECADRON  Take 1 tablet (4 mg total) by mouth 2 (two) times daily.     feeding supplement (ENSURE COMPLETE) Liqd  Take 237 mLs by mouth 2 (two) times daily between meals. Available over the counter     furosemide 20 MG tablet  Commonly known as:  LASIX  Take 20 mg by mouth as needed for fluid.     meclizine 25 MG tablet  Commonly known as:  ANTIVERT  Take 25 mg by mouth daily.     metoprolol succinate 25 MG 24 hr tablet  Commonly known as:  TOPROL-XL  Take 0.5 tablets (12.5 mg total) by mouth daily.     multivitamin with minerals Tabs tablet  Take 1 tablet by mouth every evening.     nitroGLYCERIN 0.4 MG SL tablet  Commonly known as:  NITROSTAT  Place 0.4 mg under the tongue every 5 (five) minutes as needed for chest pain.     promethazine 25 MG tablet  Commonly known as:  PHENERGAN  Take 1 tablet (25 mg total) by mouth every 6 (six) hours as needed for nausea.       Allergies  Allergen Reactions  . Morphine And Related Palpitations  . Codeine Nausea Only   Follow-up Information   Please follow up. (f/u with oncologist at [facility])       Follow up with Simona Huh, MD. Schedule an appointment as soon as possible for a visit in 2 weeks.   Specialty:  Family Medicine   Contact information:   [address], [place] [address] [phone]       Follow up with Marye Round, MD On 06/23/2013.   Specialty:  Radiation Oncology   Contact information:   [id number] [place] [address] [phone]        The results of significant diagnostics from this hospitalization (including  imaging, microbiology, ancillary and laboratory) are listed below for reference.    Significant Diagnostic Studies: Dg Chest 2 Wood  06/11/2013   CLINICAL DATA:  Cough and shortness of breath.  EXAM: CHEST  2 Wood  COMPARISON:  06/08/2013 and 05/03/2013  FINDINGS: Chronic elevation of the right hemidiaphragm. Again noted are postsurgical changes in the left shoulder consistent with a shoulder arthroplasty. Patient also has median sternotomy wires. Chronic linear densities in the right lung and anterior chest on the lateral Wood. No evidence for acute airspace disease.  There is volume loss at the left lung base. Heart size is stable. Again noted is a compression fracture in the lower thoracic spine.  IMPRESSION: Chronic lung changes with left basilar atelectasis. Minimal change from the previous examination.   Electronically Signed   By: Markus Daft M.D.   On: 06/11/2013 12:12   Dg Chest 2 Wood  06/08/2013   CLINICAL DATA:  Short of breath, cough, congestion and fatigue  EXAM: CHEST  2 Wood  COMPARISON:  Portable chest x-ray of 05/04/2013 and 05/03/2013  FINDINGS: No active infiltrate or effusion is seen. Chronic elevation of the right hemidiaphragm is again noted. The heart is mildly enlarged and stable. Median sternotomy sutures are intact from prior CABG. A left shoulder replacement is present and there are degenerative changes of the right shoulder. However, on the lateral Wood, there is approximately 40% compression deformity of either T12 or L1 which has increased somewhat since the prior lateral chest x-ray.  IMPRESSION: 1. Furthder compression of either T12 or L1 when compared to the lateral chest x-ray of 05/03/2013, now approaching 40%. 2. Stable cardiomegaly and chronic elevation of the right hemidiaphragm.   Electronically Signed   By: Ivar Drape M.D.   On: 06/08/2013 16:28   Ct Head Wo Contrast  06/11/2013   CLINICAL DATA:  Right-sided weakness.  History of prior infarct.  EXAM: CT HEAD WITHOUT  CONTRAST  TECHNIQUE: Contiguous axial images were obtained from the base of the skull through the vertex without intravenous contrast.  COMPARISON:  Brain MRI 07/30/2012.  Head CT scan 06/08/2013.  FINDINGS: Atrophy and chronic microvascular ischemic change are again seen. The left cerebral peduncle is hypoattenuating and swollen relative to the right consistent with subacute infarct. More inferiorly, there is hypoattenuation in the pons. These findings are unchanged since the most recent prior exam. The brain is atrophic with chronic microvascular ischemic change. No hemorrhage, midline shift or abnormal extra-axial fluid collection is identified. There is no hydrocephalus or pneumocephalus. The calvarium is intact.  IMPRESSION: Findings consistent with subacute infarct in the left cerebral peduncle and pons. The appearance is not markedly changed since the most recent head CT. Negative for hemorrhage.   Electronically Signed   By: Inge Rise M.D.   On: 06/11/2013 12:50   Ct Head Wo Contrast  06/08/2013   CLINICAL DATA:  Right arm weakness and facial droop  EXAM: CT HEAD WITHOUT CONTRAST  TECHNIQUE: Contiguous axial images were obtained from the base of the skull through the vertex without intravenous contrast.  COMPARISON:  MR HEAD W/O CM dated 07/30/2012  FINDINGS: There is a new rounded low-density lesion in the left midbrain measuring 10 mm (image 12, series 32). There is extensive deep white matter hypodensities. No evidence of cerebral cortical infarction. No intracranial hemorrhage. No midline shift or mass effect. No hydrocephalus.  Paranasal sinuses and mastoid air cells are clear.  IMPRESSION: 1. Concern for left midbrain infarction.  Consider brain MRI. 2. Extensive white matter microvascular disease and cortical atrophy to similar prior. These results will be called to the ordering clinician or representative by the Radiologist Assistant, and communication documented in the PACS Dashboard.    Electronically Signed   By: Suzy Bouchard M.D.   On: 06/08/2013 16:56   Ct Chest W Contrast  06/17/2013   CLINICAL DATA Brain mass.  Rule out metastases.  EXAM CT CHEST, ABDOMEN, AND PELVIS WITH CONTRAST  TECHNIQUE Multidetector CT imaging of the chest, abdomen and pelvis was performed following  the standard protocol during bolus administration of intravenous contrast.  CONTRAST 190mL OMNIPAQUE IOHEXOL 300 MG/ML  SOLN  COMPARISON Chest CT 04/07/2011.  Abdominal CT 03/11/2011  FINDINGS CT CHEST FINDINGS  THORACIC INLET/BODY WALL:  No acute abnormality.  MEDIASTINUM:  Cardiomegaly, with notable left atrial enlargement. Extensive atherosclerosis of the coronary arteries, status post CABG. The LIMA graft is not well opacified, but the saphenous vein grafts enhance well. No evidence of acute vascular abnormality.  LUNG WINDOWS:  Low volume lungs. There is chronic cysts in the peripheral right lower lung, with surrounding fibrosis. Bandlike opacities at the right base, with right that pad elevation, most consistent with atelectasis. No metastatic disease.  OSSEOUS:  No acute fracture.  No suspicious lytic or blastic lesions.  CT ABDOMEN AND PELVIS FINDINGS  ABDOMEN/PELVIS:  Liver: 4 cm cyst in the anterior liver. Caudate lobe enlargement without definitive features of cirrhosis on the liver surface.  Biliary: Cholecystectomy.  Pancreas: Unremarkable.  Spleen: Mild granulomatous changes.  Adrenals: 2.6 x 2.4 cm mass in the right adrenal gland. There may have been a small nodule previously, but none of the size.  Kidneys and ureters: There is a 4 cm solid mass in the region of the left lower pole. Although touching the lower pole, it does not appear to arise from the kidney. There are bilateral renal cysts, stable from prior. Bilateral nephrolithiasis, nonobstructive, with the largest on on the left measuring 6 mm in the lower pole and the largest on the right measuring 5 mm.  Bladder: No acute abnormality   Reproductive: Prostate enlargement  Bowel: No evidence of primary mass.  Retroperitoneum: 4 cm left perirenal mass as above. There is also a 6 mm posterior pararenal space nodule just above the right iliac crest, measuring 7 mm. There is a 2 cm mass just deep to the right inferior epigastric neurovascular bundle measuring 2 cm. This is likely extraperitoneal. There is contact with bowel which is otherwise unremarkable.  Peritoneum: No free fluid or gas.  Vascular: No acute abnormality.  OSSEOUS: 4 cm aggressive bone lesion in the left iliac crest comminuted anterior superior iliac spine.  Remote appearing T12 compression fracture with distracted, remote appearing T11 spinous process fracture. Grade 2 L5-S1 anterolisthesis.  IMPRESSION Osseous and intra-abdominal metastatic disease. The primary source is not definitively identified in the chest or abdomen. The intra-abdominal pattern of spread is to the retroperitoneum (non-nodal) and right adrenal gland. Is there any indication of melanoma on exam?  SIGNATURE  Electronically Signed   By: Jorje Guild M.D.   On: 06/17/2013 08:25   Mr Brain Wo Contrast  06/12/2013   CLINICAL DATA:  Right-sided weakness, right facial weakness, slurred speech. Stroke.  EXAM: MRI HEAD WITHOUT CONTRAST  MRA HEAD WITHOUT CONTRAST  TECHNIQUE: Multiplanar, multiecho pulse sequences of the brain and surrounding structures were obtained without intravenous contrast. Angiographic images of the head were obtained using MRA technique without contrast.  COMPARISON:  Head CT 06/11/2013 and MRI/ MRA 07/30/2012  FINDINGS: MRI HEAD FINDINGS  There is a 2.1 x 1.9 cm focus of heterogeneous, predominantly hyperintense T2 signal centered in the left upper pons/lower midbrain. There is a thin rim of slightly increased diffusion weighted signal at its periphery which may demonstrate minimally restricted diffusion. There is facilitated diffusion in the central portion of the lesion. There is no  intracranial hemorrhage. The left pons is mildly expanded, and there is T2/FLAIR hyperintensity throughout the pons and extending into the left middle cerebellar peduncle and  left greater than right cerebral peduncle is compatible with edema. There is a small amount of edema in the posterior limb of internal capsule on the left as well.  There is moderate cerebral atrophy. Small, scattered foci of T2 hyperintensity are present in the subcortical and deep cerebral white matter bilaterally, not significantly changed and compatible with mild chronic small vessel ischemic disease. There is no midline shift or extra-axial fluid collection. Orbits are unremarkable. Paranasal sinuses and mastoid air cells are clear.  MRA HEAD FINDINGS  Images are moderately degraded by motion. Flow is again not identified in the distal right vertebral artery, consistent with proximal occlusion. The visualized distal left vertebral artery is patent without high-grade stenosis. PICAs are not well visualized. Basilar artery is patent without evidence of significant stenosis. SCA origins are grossly patent. There is a fetal origin of the right PCA. PCAs are otherwise grossly unremarkable.  Internal carotid arteries are patent from skullbase to carotid termini. Bilateral carotid siphon irregularity is again seen. There is again evidence of high-grade stenosis in the anterior left cavernous ICA. The left A1 segment is again noted to be dominant with the azygos ACA anatomy. Relatively symmetric flow is identified within the MCAs with mild branch vessel irregularity bilaterally. No intracranial aneurysm is identified.  IMPRESSION: 1. 2 cm heterogeneous lesion involving the left upper pons/lower midbrain with surrounding edema. Findings may reflect a subacute infarct, however postcontrast imaging and follow-up MRI are recommended to exclude a mass. 2. Mild chronic small vessel ischemic disease. 3. Motion degraded MRA without evidence of new  intracranial arterial occlusion. 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   Electronically Signed   By: Logan Bores   On: 06/12/2013 14:37   Mr Brain W Contrast  06/17/2013   ADDENDUM REPORT: 06/17/2013 16:09  ADDENDUM: The patient was brought back 06/17/2013 for postcontrast axial imaging according to the [facility] protocol.  This is done for [facility] planning purposes. The left midbrain lesion is again noted. No additional lesions are evident. There is no significant interval change.   Electronically Signed   By: Lawrence Santiago M.D.   On: 06/17/2013 16:09   06/17/2013   CLINICAL DATA:  Brainstem mass.  EXAM: MRI HEAD WITH CONTRAST  TECHNIQUE: Multiplanar, multiecho pulse sequences of the brain and surrounding structures were obtained with intravenous contrast.  COMPARISON:  Noncontrast MRI brain 06/12/2013  CONTRAST:  55mL MULTIHANCE GADOBENATE DIMEGLUMINE 529 MG/ML IV SOLN  FINDINGS: The lesion in the left midbrain demonstrates predominantly peripheral enhancement measuring 2.1 x 2.1 x 1.7 cm. Surrounding vasogenic edema is again seen. The edema extends superiorly in the left cerebral peduncle and along white matter tracts. There is also edema extending inferiorly in the brainstem and left cerebellar peduncle. The enhancement characteristics are most typical of a mass. 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 A focal metastasis is also considered. This does not have the appearance of an infarct. Scattered white matter changes are again noted bilaterally.  IMPRESSION: 1. 2.1 x 2.1 x 1.7 cm peripherally enhancing mass lesion. 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 Metastatic disease is also considered. 2. Atrophy and white matter disease is again noted.  Electronically Signed: By: Lawrence Santiago M.D. On: 06/15/2013 15:54   Ct  Abdomen Pelvis W Contrast  06/17/2013   CLINICAL DATA Brain mass.  Rule out metastases.  EXAM CT CHEST, ABDOMEN, AND PELVIS WITH CONTRAST  TECHNIQUE Multidetector CT imaging of the chest, abdomen and pelvis was performed following the standard protocol during bolus administration of intravenous contrast.  CONTRAST 183mL OMNIPAQUE IOHEXOL 300 MG/ML  SOLN  COMPARISON Chest CT 04/07/2011.  Abdominal CT 03/11/2011  FINDINGS CT CHEST FINDINGS  THORACIC INLET/BODY WALL:  No acute abnormality.  MEDIASTINUM:  Cardiomegaly, with notable left atrial enlargement. Extensive atherosclerosis of the coronary arteries, status post CABG. The LIMA graft is not well opacified, but the saphenous vein grafts enhance well. No evidence of acute vascular abnormality.  LUNG WINDOWS:  Low volume lungs. There is chronic cysts in the peripheral right lower lung, with surrounding fibrosis. Bandlike opacities at the right base, with right that pad elevation, most consistent with atelectasis. No metastatic disease.  OSSEOUS:  No acute fracture.  No suspicious lytic or blastic lesions.  CT ABDOMEN AND PELVIS FINDINGS  ABDOMEN/PELVIS:  Liver: 4 cm cyst in the anterior liver. Caudate lobe enlargement without definitive features of cirrhosis on the liver surface.  Biliary: Cholecystectomy.  Pancreas: Unremarkable.  Spleen: Mild granulomatous changes.  Adrenals: 2.6 x 2.4 cm mass in the right adrenal gland. There may have been a small nodule previously, but none of the size.  Kidneys and ureters: There is a 4 cm solid mass in the region of the left lower pole. Although touching the lower pole, it does not appear to arise from the kidney. There are bilateral renal cysts, stable from prior. Bilateral nephrolithiasis, nonobstructive, with the largest on on the left measuring 6 mm in the lower pole and the largest on the right measuring 5 mm.  Bladder: No acute abnormality  Reproductive: Prostate enlargement  Bowel: No evidence of primary mass.   Retroperitoneum: 4 cm left perirenal mass as above. There is also a 6 mm posterior pararenal space nodule just above the right iliac crest, measuring 7 mm. There is a 2 cm mass just deep to the right inferior epigastric neurovascular bundle measuring 2 cm. This is likely extraperitoneal. There is contact with bowel which is otherwise unremarkable.  Peritoneum: No free fluid or gas.  Vascular: No acute abnormality.  OSSEOUS: 4 cm aggressive bone lesion in the left iliac crest comminuted anterior superior iliac spine.  Remote appearing T12 compression fracture with distracted, remote appearing T11 spinous process fracture. Grade 2 L5-S1 anterolisthesis.  IMPRESSION Osseous and intra-abdominal metastatic disease. The primary source is not definitively identified in the chest or abdomen. The intra-abdominal pattern of spread is to the retroperitoneum (non-nodal) and right adrenal gland. Is there any indication of melanoma on exam?  SIGNATURE  Electronically Signed   By: Jorje Guild M.D.   On: 06/17/2013 08:25   Mr Jodene Nam Head/brain Wo Cm  06/12/2013   CLINICAL DATA:  Right-sided weakness, right facial weakness, slurred speech. Stroke.  EXAM: MRI HEAD WITHOUT CONTRAST  MRA HEAD WITHOUT CONTRAST  TECHNIQUE: Multiplanar, multiecho pulse sequences of the brain and surrounding structures were obtained without intravenous contrast. Angiographic images of the head were obtained using MRA technique without contrast.  COMPARISON:  Head CT 06/11/2013 and MRI/ MRA 07/30/2012  FINDINGS: MRI HEAD FINDINGS  There is a 2.1 x 1.9 cm focus of heterogeneous, predominantly hyperintense T2 signal centered in the left upper pons/lower midbrain. There is a thin rim of slightly increased diffusion weighted signal at its periphery which may demonstrate minimally restricted diffusion. There is facilitated diffusion in the central portion of the lesion. There is no intracranial hemorrhage. The  left pons is mildly expanded, and there is  T2/FLAIR hyperintensity throughout the pons and extending into the left middle cerebellar peduncle and left greater than right cerebral peduncle is compatible with edema. There is a small amount of edema in the posterior limb of internal capsule on the left as well.  There is moderate cerebral atrophy. Small, scattered foci of T2 hyperintensity are present in the subcortical and deep cerebral white matter bilaterally, not significantly changed and compatible with mild chronic small vessel ischemic disease. There is no midline shift or extra-axial fluid collection. Orbits are unremarkable. Paranasal sinuses and mastoid air cells are clear.  MRA HEAD FINDINGS  Images are moderately degraded by motion. Flow is again not identified in the distal right vertebral artery, consistent with proximal occlusion. The visualized distal left vertebral artery is patent without high-grade stenosis. PICAs are not well visualized. Basilar artery is patent without evidence of significant stenosis. SCA origins are grossly patent. There is a fetal origin of the right PCA. PCAs are otherwise grossly unremarkable.  Internal carotid arteries are patent from skullbase to carotid termini. Bilateral carotid siphon irregularity is again seen. There is again evidence of high-grade stenosis in the anterior left cavernous ICA. The left A1 segment is again noted to be dominant with the azygos ACA anatomy. Relatively symmetric flow is identified within the MCAs with mild branch vessel irregularity bilaterally. No intracranial aneurysm is identified.  IMPRESSION: 1. 2 cm heterogeneous lesion involving the left upper pons/lower midbrain with surrounding edema. Findings may reflect a subacute infarct, however postcontrast imaging and follow-up MRI are recommended to exclude a mass. 2. Mild chronic small vessel ischemic disease. 3. Motion degraded MRA without evidence of new intracranial arterial occlusion. 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   Electronically Signed   By: Logan Bores   On: 06/12/2013 14:37    Microbiology: No results found for this or any previous visit (from the past 240 hour(s)).   Labs: Basic Metabolic Panel:  Recent Labs Lab 06/12/13 0543 06/16/13 0641 06/17/13 0513 06/18/13 0502  NA 142 139 135* 138  K 4.4 3.9 4.4 4.7  CL 104 101 96 100  CO2 27 28 24 26   GLUCOSE 87 82 126* 130*  BUN 14 11 16 20   CREATININE 0.98 0.89 0.83 0.90  CALCIUM 9.7 9.6 9.8 9.4   Liver Function Tests: No results found for this basename: AST, ALT, ALKPHOS, BILITOT, PROT, ALBUMIN,  in the last 168 hours No results found for this basename: LIPASE, AMYLASE,  in the last 168 hours No results found for this basename: AMMONIA,  in the last 168 hours CBC:  Recent Labs Lab 06/12/13 0543 06/16/13 0641 06/17/13 0513 06/18/13 0502  WBC 6.5 6.2 6.9 7.1  HGB 10.6* 11.0* 11.3* 11.0*  HCT 33.2* 34.7* 34.8* 34.5*  MCV 82.0 80.9 80.2 80.0  PLT 194 190 222 208   Cardiac Enzymes: No results found for this basename: CKTOTAL, CKMB, CKMBINDEX, TROPONINI,  in the last 168 hours BNP: BNP (last 3 results)  Recent Labs  05/04/13 1930  PROBNP 604.2*   CBG: No results found for this basename: GLUCAP,  in the last 168 hours     Signed:  [facility] MD [facility] 06/18/2013, 2:04 PM

## 2013-06-18 NOTE — Telephone Encounter (Signed)
Per dispatcher Crystal, she stated ,it shouldn't be much longer to come get patient 2:39 PM

## 2013-06-18 NOTE — Plan of Care (Signed)
Report called to Jaconita, Rn at [facility].

## 2013-06-18 NOTE — Progress Notes (Signed)
Called [facility],[phone],  spoke with dispatcher, patient completed CT simulation ready to go back to hospital, per dispatcher may be over an hour before they cn come get patient, they are on call for a stroke patient and an overdose patient,  Patient via w/c brought to room 1, warm blankets applied, oxygen back on 2 liters , n/c, asked patient if I could get him anything"I haven't had anything to eat ,I'm hungry, sandwich would be great", offered coffee, [place] sandwich with slice cheese and lays potato chips, call bell within reach, T.V. Remote given to patient, call if needing restroom , patient declined to move out of w/c, "this is fine,thank you' patient stated no difficulty swallowing or chewing food/fluids, no nausea, or head ache, left hand heplock intact 12:44 PM

## 2013-06-18 NOTE — Progress Notes (Signed)
SLP Cancellation Note  Patient Details Name: Gabriel Wood MRN: [id number] DOB: 1924/03/11   Cancelled treatment:       Reason Eval/Treat Not Completed: Patient at procedure or test/unavailable; when returns from rad tx is to D/C to [facility].     Juan Quam Laurice 06/18/2013, 1:42 PM

## 2013-06-18 NOTE — Clinical Social Work Note (Addendum)
CSW has faxed oncology and neurology clinical documentation to [facility] for clinical review. [facility] to review information and confirm with CSW that pt is able to be admitted to [facility] this evening. CSW to continue to follow and assist with discharge planning needs. Per admissions liaison at [facility], [facility] will need completed discharge summary faxed to them by 3pm.  UPDATE: [facility] able to admit pt once his radiation is complete on 06/18/2013. RN notified, and RN to notify pt's daughter. CSW has faxed discharge summary to [facility]. CSW has completed discharge packet and placed on pt's shadow chart. CSW has completed necessary ambulance transportation form and placed on discharge packet. RN to call for ambulance transportation once pt returns to [facility] from his radiation treatment.  RN to please call report to [phone] [facility] (ambulance) Wing, [place] Worker [phone]

## 2013-06-18 NOTE — Progress Notes (Signed)
Patient is discharged from room 4N17 at this time. Alert and in stable condition. IV site d/c'd as well as tele. Transported via Biomedical scientist by [facility] with belongings.

## 2013-06-23 ENCOUNTER — Encounter: Payer: Self-pay | Admitting: Radiation Oncology

## 2013-06-23 ENCOUNTER — Ambulatory Visit
Admit: 2013-06-23 | Discharge: 2013-06-23 | Disposition: A | Discharge status: Home / Self Care | Payer: Medicare Other | Attending: Radiation Oncology | Admitting: Radiation Oncology

## 2013-06-23 VITALS — BP 130/69 | HR 69 | Temp 97.6°F | Resp 16

## 2013-06-23 DIAGNOSIS — C799 Secondary malignant neoplasm of unspecified site: Secondary | ICD-10-CM

## 2013-06-23 DIAGNOSIS — C7931 Secondary malignant neoplasm of brain: Secondary | ICD-10-CM

## 2013-06-23 DIAGNOSIS — C439 Malignant melanoma of skin, unspecified: Secondary | ICD-10-CM

## 2013-06-23 DIAGNOSIS — C7949 Secondary malignant neoplasm of other parts of nervous system: Secondary | ICD-10-CM

## 2013-06-23 MED ORDER — DEXAMETHASONE 4 MG PO TABS
4.0000 mg | ORAL_TABLET | Freq: Once | ORAL | Status: AC
  Administered 2013-06-23: 4 mg via ORAL
  Filled 2013-06-23: qty 1

## 2013-06-23 NOTE — Op Note (Signed)
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 Kyung Rudd (radiation oncology) prior to treatment.  It showed 3-dimensional radiation distributions overlaid onto the planning CT/MRI image set.  The [facility] for the target structures as well as the organs at risk were reviewed. The documentation of the 3D plan and dosimetry are filed in the radiation oncology EMR.  NARRATIVE:  Gabriel Wood was brought to the TrueBeam stereotactic radiation treatment machine and placed supine on the CT couch. The head frame was applied, and the patient was set up for stereotactic radiosurgery.  I was present for the set-up and delivery.  SIMULATION VERIFICATION:  In the couch zero-angle position, the patient underwent Exactrac imaging using the Brainlab system with orthogonal KV images.  These were carefully aligned and repeated to confirm treatment position for each of the isocenters.  The Exactrac snap film verification was repeated at each couch angle.  SPECIAL TREATMENT PROCEDURE: Gabriel Wood received stereotactic radiosurgery to the following targets: Left midbrain target was treated using 5 Dynamic Conformal Arcs to a prescription dose of 14 Gy.  ExacTrac registration was performed for each couch angle.  The 83% isodose line was prescribed.  STEREOTACTIC TREATMENT MANAGEMENT:  Following delivery, the patient was transported to nursing in stable condition and monitored for possible acute effects.  Vital signs were recorded BP 130/69  Pulse 69  Temp(Src) 97.6 F (36.4 C) (Oral)  Resp 16   SpO2 97%. The patient tolerated treatment without significant acute effects, and was discharged to home in stable condition.    PLAN: Follow-up in 3 weeks with Dr. Lisbeth Renshaw (to be arranged by [facility] navigator, Mont Dutton).  PATIENT AND FAMILY INSTRUCTIONS:  Patients's daughters given Rx to give to [facility] regarding Decadron dosing as follows:  3/18-21 - 4 mg po q6hr  3/22-24 - 4 mg po q8hr  3/25-28 - 4 mg po q12hr  3/29 ->   - 2 mg po q12hr

## 2013-06-23 NOTE — Progress Notes (Signed)
2nd set vitals taken, 97.6, 130/69, p=69,rr=16, no c/o pain, h/a, nausea, blurred vision, sent patient home via w/c to lobby with family, patient askjed before leaving if he had been given instructionss of his decadron taper,"Yes and my daughter knows and has his rx" infomred Dr.Moody of vitals2:11 PM

## 2013-06-23 NOTE — Progress Notes (Signed)
S/p RS brain,vitals taken, offered water, per verbal and written order gave 4mg  decadron po at 1315 , patient given after stating name and dob as identification,also patient has id band on and checked as well, , patient was given rx for tapered decadron per Dr.Nudleman, Patient voiced no c/o pain, head ache, nausea, nor dizzy ness or blurred vision, ,refused offer of warm blanket or food, will continue to monitor  And recheck vitals at 2pm RX Decadroin tapering:  06/23/13 -06/26/13 =4mg  table oral every 6 hours, 06/27/13-06/29/13= 4mg  tablet oral every 8 hours 06/30/13-07/03/13= 4mg  tablet oral every 12 hours 06/2913 thru= 2mg  tablet oral every 12 hours

## 2013-06-24 ENCOUNTER — Ambulatory Visit
Admit: 2013-06-24 | Discharge: 2013-06-24 | Disposition: A | Discharge status: Home / Self Care | Payer: Medicare Other | Attending: Radiation Oncology | Admitting: Radiation Oncology

## 2013-06-24 DIAGNOSIS — C7951 Secondary malignant neoplasm of bone: Secondary | ICD-10-CM | POA: Insufficient documentation

## 2013-06-24 DIAGNOSIS — C7952 Secondary malignant neoplasm of bone marrow: Principal | ICD-10-CM

## 2013-06-24 NOTE — Progress Notes (Signed)
  Radiation Oncology         [phone] ________________________________  Name: Gabriel Wood MRN: [id number]  Date: 06/24/2013  DOB: May 19, 1923  Simulation Verification Note   NARRATIVE: The patient was brought to the treatment unit and placed in the planned treatment position. The clinical setup was verified. Then port films were obtained and uploaded to the radiation oncology medical record software.  The treatment beams were carefully compared against the planned radiation fields. The position, location, and shape of the radiation fields was reviewed. The targeted volume of tissue appears to be appropriately covered by the radiation beams. Based on my personal review, I approved the simulation verification. The patient's treatment will proceed as planned.  ________________________________   Jodelle Gross, MD, PhD

## 2013-06-25 ENCOUNTER — Encounter: Payer: Self-pay | Admitting: Radiation Oncology

## 2013-06-25 ENCOUNTER — Ambulatory Visit
Admission: RE | Admit: 2013-06-25 | Discharge: 2013-06-25 | Disposition: A | Discharge status: Home / Self Care | Payer: Medicare Other | Source: Ambulatory Visit | Attending: Radiation Oncology | Admitting: Radiation Oncology

## 2013-06-25 VITALS — BP 119/50 | HR 74 | Resp 18 | Wt 170.8 lb

## 2013-06-25 DIAGNOSIS — C7951 Secondary malignant neoplasm of bone: Secondary | ICD-10-CM

## 2013-06-25 DIAGNOSIS — C7952 Secondary malignant neoplasm of bone marrow: Principal | ICD-10-CM

## 2013-06-25 NOTE — Progress Notes (Signed)
Denies left hip pain at this time. Denies pain when he stood for weight. Denies taking pain medication today except for normal daily tylenol dose. Wet cough noted. Reports cough is productive with clear sputum. Reports a good appetite.

## 2013-06-25 NOTE — Progress Notes (Signed)
   Department of Radiation Oncology  Phone:  [phone] Fax:        [phone]  Weekly Treatment Note    Name: Gabriel Wood Date: 06/25/2013 MRN: [id number] DOB: Feb 23, 1924   Current dose: 8 Gy  Current fraction: 2   MEDICATIONS: Current Outpatient Prescriptions  Medication Sig Dispense Refill  . acetaminophen (TYLENOL) 650 MG CR tablet Take 1,300 mg by mouth every 8 (eight) hours as needed for pain.      Marland Kitchen albuterol (PROVENTIL HFA;VENTOLIN HFA) 108 (90 BASE) MCG/ACT inhaler Inhale 2 puffs into the lungs every 6 (six) hours as needed for wheezing or shortness of breath.      Marland Kitchen aspirin EC 81 MG tablet Take 81 mg by mouth daily.      Marland Kitchen atorvastatin (LIPITOR) 40 MG tablet Take 40 mg by mouth daily.       . budesonide-formoterol (SYMBICORT) 160-4.5 MCG/ACT inhaler Inhale 2 puffs into the lungs 2 (two) times daily.      . citalopram (CELEXA) 10 MG tablet Take 10 mg by mouth daily.       Marland Kitchen dexamethasone (DECADRON) 4 MG tablet Take 1 tablet (4 mg total) by mouth 2 (two) times daily.  62 tablet  0  . feeding supplement (ENSURE COMPLETE) LIQD Take 237 mLs by mouth 2 (two) times daily between meals. Available over the counter  30 Bottle  3  . furosemide (LASIX) 20 MG tablet Take 20 mg by mouth as needed for fluid.      Marland Kitchen meclizine (ANTIVERT) 25 MG tablet Take 25 mg by mouth daily.       . metoprolol succinate (TOPROL-XL) 25 MG 24 hr tablet Take 0.5 tablets (12.5 mg total) by mouth daily.  30 tablet  0  . Multiple Vitamin (MULTIVITAMIN WITH MINERALS) TABS Take 1 tablet by mouth every evening.      . nitroGLYCERIN (NITROSTAT) 0.4 MG SL tablet Place 0.4 mg under the tongue every 5 (five) minutes as needed for chest pain.      . promethazine (PHENERGAN) 25 MG tablet Take 1 tablet (25 mg total) by mouth every 6 (six) hours as needed for nausea.  20 tablet  0   No current facility-administered medications for this encounter.     ALLERGIES: Morphine and related and  Codeine   LABORATORY DATA:  Lab Results  Component Value Date   WBC 7.1 06/18/2013   HGB 11.0* 06/18/2013   HCT 34.5* 06/18/2013   MCV 80.0 06/18/2013   PLT 208 06/18/2013   Lab Results  Component Value Date   NA 138 06/18/2013   K 4.7 06/18/2013   CL 100 06/18/2013   CO2 26 06/18/2013   Lab Results  Component Value Date   ALT 10 06/11/2013   AST 16 06/11/2013   ALKPHOS 64 06/11/2013   BILITOT 0.4 06/11/2013     NARRATIVE: Gabriel Wood was seen today for weekly treatment management. The chart was checked and the patient's films were reviewed. The patient is doing well. No nausea. No diarrhea.  PHYSICAL EXAMINATION: weight is 170 lb 12.8 oz (77.474 kg). His blood pressure is 119/50 and his pulse is 74. His respiration is 18.        ASSESSMENT: The patient is doing satisfactorily with treatment.  PLAN: We will continue with the patient's radiation treatment as planned.

## 2013-06-28 ENCOUNTER — Ambulatory Visit
Admit: 2013-06-28 | Discharge: 2013-06-28 | Disposition: A | Discharge status: Home / Self Care | Payer: Medicare Other | Attending: Radiation Oncology | Admitting: Radiation Oncology

## 2013-06-29 ENCOUNTER — Ambulatory Visit
Admission: RE | Admit: 2013-06-29 | Discharge: 2013-06-29 | Disposition: A | Discharge status: Home / Self Care | Payer: Medicare Other | Source: Ambulatory Visit | Attending: Radiation Oncology | Admitting: Radiation Oncology

## 2013-06-29 DIAGNOSIS — C7949 Secondary malignant neoplasm of other parts of nervous system: Secondary | ICD-10-CM

## 2013-06-29 DIAGNOSIS — C7931 Secondary malignant neoplasm of brain: Secondary | ICD-10-CM | POA: Insufficient documentation

## 2013-06-29 NOTE — Progress Notes (Signed)
  Radiation Oncology         [phone] ________________________________  Name: ELIAKIM TENDLER MRN: [id number]  Date: 06/23/2013  DOB: Jan 29, 1924   SPECIAL TREATMENT PROCEDURE   3D TREATMENT PLANNING AND DOSIMETRY: The patient's radiation plan was reviewed and approved by Dr. Sherwood Gambler from neurosurgery and radiation oncology prior to treatment. It showed 3-dimensional radiation distributions overlaid onto the planning CT/MRI image set. The [facility] for the target structures as well as the organs at risk were reviewed. The documentation of the 3D plan and dosimetry are filed in the radiation oncology EMR.   NARRATIVE: The patient was brought to the TrueBeam stereotactic radiation treatment machine and placed supine on the CT couch. The head frame was applied, and the patient was set up for stereotactic radiosurgery. Neurosurgery was present for the set-up and delivery   SIMULATION VERIFICATION: In the couch zero-angle position, the patient underwent Exactrac imaging using the Brainlab system with orthogonal KV images. These were carefully aligned and repeated to confirm treatment position for each of the isocenters. The Exactrac snap film verification was repeated at each couch angle.   SPECIAL TREATMENT PROCEDURE: The patient received stereotactic radiosurgery to the following targets:  PTV left midbrain target was treated using 5 Arcs to a prescription dose of 14 Gy. ExacTrac Snap verification was performed for each couch angle.   STEREOTACTIC TREATMENT MANAGEMENT: Following delivery, the patient was transported to nursing in stable condition and monitored for possible acute effects. Vital signs were recorded . The patient tolerated treatment without significant acute effects, and was discharged to home in stable condition.  PLAN: Follow-up in one month.   ------------------------------------------------  Jodelle Gross, MD, PhD

## 2013-06-29 NOTE — Addendum Note (Signed)
Encounter addended by: Marye Round, MD on: 06/29/2013 10:34 PM<BR>     Documentation filed: Notes Section

## 2013-06-29 NOTE — Progress Notes (Addendum)
  Radiation Oncology         [phone] ________________________________  Name: Gabriel Wood MRN: [id number]  Date: 06/18/2013  DOB: Dec 27, 1923  SIMULATION AND TREATMENT PLANNING NOTE  DIAGNOSIS:  Metastatic melanoma with brain metastasis  NARRATIVE:  The patient was brought to the [facility] suite.  Identity was confirmed.  All relevant records and images related to the planned course of therapy were reviewed.  The patient freely provided informed written consent to proceed with treatment after reviewing the details related to the planned course of therapy. The consent form was witnessed and verified by the simulation staff. Intravenous access was established for contrast administration. Then, the patient was set-up in a stable reproducible supine position for radiation therapy.  A relocatable thermoplastic stereotactic head frame was fabricated for precise immobilization.  CT images were obtained.  Surface markings were placed.  The CT images were loaded into the planning software and fused with the patient's targeting MRI scan.  Then the target and avoidance structures were contoured.  Treatment planning then occurred.  The radiation prescription was entered and confirmed.  I have requested 3D planning  I have requested a DVH of the following structures: Brain stem, brain, left eye, right I, lenses, optic chiasm, target volumes, uninvolved brain, and normal tissue.    The patient also will receive treatment in a palliative manner to a bony metastasis within the left pelvis. With the patient in a supine position, a customized VAC lock bag was constructed to help with patient immobilization. This complex treatment device will be used daily during this portion of his treatment. In this fashion a CT scan was obtained and an isocenter was placed within the left superior pelvis within the iliac bone. A total of 2 customized fields have been designed to treat this lesion with oblique  fields. A complex isodose plan is requested for this target region.  PLAN:  The patient will receive 14 Gy in 1 fraction to the mid brain metastasis, and the patient will receive 20 gray in 5 fractions to the left pelvic bony metastasis.  ________________________________  Jodelle Gross, MD, PhD

## 2013-06-30 ENCOUNTER — Ambulatory Visit
Admission: RE | Admit: 2013-06-30 | Discharge: 2013-06-30 | Disposition: A | Discharge status: Home / Self Care | Payer: Medicare Other | Source: Ambulatory Visit | Attending: Radiation Oncology | Admitting: Radiation Oncology

## 2013-06-30 ENCOUNTER — Encounter: Payer: Self-pay | Admitting: Radiation Oncology

## 2013-06-30 ENCOUNTER — Telehealth: Payer: Self-pay | Admitting: *Deleted

## 2013-06-30 NOTE — Progress Notes (Signed)
Weekly rad txs  Lt plvis 5/5, completed, states no pain, no nausa,  Is fatigued, good appetite, dry flaky skin,   Slight left  peripheral vision blurry , no swallowing difficulty does have a congestive cough 1 month f/u made already 07/12/13 by Mont Dutton 11:06 AM

## 2013-06-30 NOTE — Telephone Encounter (Signed)
On 06-30-13 fax medical records to  [facility] , it was consult note,

## 2013-07-06 ENCOUNTER — Encounter: Payer: Self-pay | Admitting: Radiation Oncology

## 2013-07-12 ENCOUNTER — Ambulatory Visit
Admission: RE | Admit: 2013-07-12 | Discharge: 2013-07-12 | Disposition: A | Discharge status: Home / Self Care | Payer: Medicare Other | Source: Ambulatory Visit | Attending: Radiation Oncology | Admitting: Radiation Oncology

## 2013-07-12 ENCOUNTER — Encounter: Payer: Self-pay | Admitting: Radiation Oncology

## 2013-07-12 ENCOUNTER — Telehealth: Payer: Self-pay | Admitting: *Deleted

## 2013-07-12 VITALS — BP 80/51 | HR 90 | Temp 97.4°F | Resp 20 | Wt 166.8 lb

## 2013-07-12 DIAGNOSIS — C7951 Secondary malignant neoplasm of bone: Secondary | ICD-10-CM

## 2013-07-12 DIAGNOSIS — C7931 Secondary malignant neoplasm of brain: Secondary | ICD-10-CM

## 2013-07-12 DIAGNOSIS — C7949 Secondary malignant neoplasm of other parts of nervous system: Principal | ICD-10-CM

## 2013-07-12 DIAGNOSIS — C7952 Secondary malignant neoplasm of bone marrow: Secondary | ICD-10-CM

## 2013-07-12 HISTORY — DX: Personal history of irradiation: Z92.3

## 2013-07-12 NOTE — Telephone Encounter (Signed)
Called daughter  And per Dr.moody's instructions, to decrease decadron(dexamethasone0 to 1 tablet(4mg ) x 1 per day for 1 week, then take 1/2 tab(2mg ) x 1 per day for 1 week, then discontinue, daughter stated back verbal understanding Not otherwise examined today.

## 2013-07-12 NOTE — Progress Notes (Signed)
Follow up rad tx lt pelvis 5/5 , completed 06/30/13, wobbly unsteady today, but took long bnap yesterday after eating and didn't sleep well satated last night, low b/p, unable to get standing, too unsteady, coughing up loos clear sputum, 92 % room air sats, dry,flaky skin on arms, fatigued, no c/o nausea, had scrambled eggs, toast, bacon,  Coffee, weak

## 2013-07-12 NOTE — Patient Instructions (Signed)
Decrease decadron (dexamethasone) to 1 tablet (4 mg) x1 per day, and continue this for 1 week. Then take 1/2 tablet x1 per day for 1 week. Then discontinue medicine.

## 2013-07-12 NOTE — Telephone Encounter (Signed)
Daughter called asking how Dr.moody wants to decrease the patient's decadron, the son couldn't remember, informed her I would call back after speaking with  Dr.Moody as he was in with  concult at the moment, I will call her at [phone],daughter thanked this RN, will await call back 2:27 PM

## 2013-07-13 ENCOUNTER — Telehealth: Payer: Self-pay | Admitting: *Deleted

## 2013-07-13 NOTE — Progress Notes (Signed)
  Radiation Oncology         [phone] ________________________________  Name: Gabriel Wood MRN: [id number]  Date: 07/31/2013  DOB: 10/19/1923  End of Treatment Note  Diagnosis:   Metastatic melanoma     Indication for treatment:  Palliative       Radiation treatment dates:   06/24/2013 through 06/30/2013  Site/dose:   The patient was treated to a destructive lesion within the left iliac. This was treated in a hypo-fractionated manner to a dose of 20 gray in 5 fractions.  Narrative: The patient tolerated radiation treatment relatively well.   The patient did not exhibit any difficulties during treatment in terms of acute toxicity. No GI issues including no nausea and no diarrhea or  Plan: The patient has completed radiation treatment. The patient will return to radiation oncology clinic for routine followup in one month. I advised the patient to call or return sooner if they have any questions or concerns related to their recovery or treatment. ________________________________  Jodelle Gross, M.D., Ph.D.

## 2013-07-13 NOTE — Telephone Encounter (Signed)
done

## 2013-07-13 NOTE — Progress Notes (Signed)
Radiation Oncology         [phone] ________________________________  Name: Gabriel Wood MRN: [id number]  Date: 07/12/2013  DOB: 07/19/1923  Follow-Up Visit Note  CC: Simona Huh, MD  Gaynelle Arabian, MD  Diagnosis:   Metastatic melanoma  Interval Since Last Radiation:  2 weeks   Narrative:  The patient returns today for routine follow-up.  The patient completed radiosurgery recently as well as palliative radiation to the left pelvis superiorly. He indicates he has not had any major issues since he completed treatment. He has been evaluated once again at [facility].  The patient has had some unsteadiness. He continues taking Decadron at 4 mg twice a day. We discussed further tapering this as he has not had any difficulty with headaches or nausea. Clinically he has been stable.                              ALLERGIES:  is allergic to morphine and related and codeine.  Meds: Current Outpatient Prescriptions  Medication Sig Dispense Refill  . acetaminophen (TYLENOL) 650 MG CR tablet Take 1,300 mg by mouth every 8 (eight) hours as needed for pain.      Marland Kitchen albuterol (PROVENTIL HFA;VENTOLIN HFA) 108 (90 BASE) MCG/ACT inhaler Inhale 2 puffs into the lungs every 6 (six) hours as needed for wheezing or shortness of breath.      Marland Kitchen aspirin EC 81 MG tablet Take 81 mg by mouth daily.      Marland Kitchen atorvastatin (LIPITOR) 40 MG tablet Take 40 mg by mouth daily.       . budesonide-formoterol (SYMBICORT) 160-4.5 MCG/ACT inhaler Inhale 2 puffs into the lungs 2 (two) times daily.      . citalopram (CELEXA) 10 MG tablet Take 10 mg by mouth daily.       Marland Kitchen dexamethasone (DECADRON) 4 MG tablet Take 1 tablet (4 mg total) by mouth 2 (two) times daily.  62 tablet  0  . feeding supplement (ENSURE COMPLETE) LIQD Take 237 mLs by mouth 2 (two) times daily between meals. Available over the counter  30 Bottle  3  . furosemide (LASIX) 20 MG tablet Take 20 mg by mouth as needed for fluid.      Marland Kitchen meclizine (ANTIVERT) 25  MG tablet Take 25 mg by mouth daily.       . metoprolol succinate (TOPROL-XL) 25 MG 24 hr tablet Take 0.5 tablets (12.5 mg total) by mouth daily.  30 tablet  0  . Multiple Vitamin (MULTIVITAMIN WITH MINERALS) TABS Take 1 tablet by mouth every evening.      . nitroGLYCERIN (NITROSTAT) 0.4 MG SL tablet Place 0.4 mg under the tongue every 5 (five) minutes as needed for chest pain.      . promethazine (PHENERGAN) 25 MG tablet Take 1 tablet (25 mg total) by mouth every 6 (six) hours as needed for nausea.  20 tablet  0   No current facility-administered medications for this encounter.    Physical Findings: The patient is in no acute distress. Patient is alert and oriented.  weight is 166 lb 12.8 oz (75.66 kg). His oral temperature is 97.4 F (36.3 C). His blood pressure is 80/51 and his pulse is 90. His respiration is 20 and oxygen saturation is 92%. .   General:  Sitting in a wheelchair, somewhat frail HEENT: Normocephalic, atraumatic Cardiovascular: Regular rate and rhythm Respiratory: Clear to auscultation bilaterally GI: Soft, nontender, normal bowel sounds  Extremities: No edema present   Lab Findings: Lab Results  Component Value Date   WBC 7.1 06/18/2013   HGB 11.0* 06/18/2013   HCT 34.5* 06/18/2013   MCV 80.0 06/18/2013   PLT 208 06/18/2013     Radiographic Findings: Ct Chest W Contrast  06/17/2013   CLINICAL DATA Brain mass.  Rule out metastases.  EXAM CT CHEST, ABDOMEN, AND PELVIS WITH CONTRAST  TECHNIQUE Multidetector CT imaging of the chest, abdomen and pelvis was performed following the standard protocol during bolus administration of intravenous contrast.  CONTRAST 170mL OMNIPAQUE IOHEXOL 300 MG/ML  SOLN  COMPARISON Chest CT 04/07/2011.  Abdominal CT 03/11/2011  FINDINGS CT CHEST FINDINGS  THORACIC INLET/BODY WALL:  No acute abnormality.  MEDIASTINUM:  Cardiomegaly, with notable left atrial enlargement. Extensive atherosclerosis of the coronary arteries, status post CABG. The LIMA  graft is not well opacified, but the saphenous vein grafts enhance well. No evidence of acute vascular abnormality.  LUNG WINDOWS:  Low volume lungs. There is chronic cysts in the peripheral right lower lung, with surrounding fibrosis. Bandlike opacities at the right base, with right that pad elevation, most consistent with atelectasis. No metastatic disease.  OSSEOUS:  No acute fracture.  No suspicious lytic or blastic lesions.  CT ABDOMEN AND PELVIS FINDINGS  ABDOMEN/PELVIS:  Liver: 4 cm cyst in the anterior liver. Caudate lobe enlargement without definitive features of cirrhosis on the liver surface.  Biliary: Cholecystectomy.  Pancreas: Unremarkable.  Spleen: Mild granulomatous changes.  Adrenals: 2.6 x 2.4 cm mass in the right adrenal gland. There may have been a small nodule previously, but none of the size.  Kidneys and ureters: There is a 4 cm solid mass in the region of the left lower pole. Although touching the lower pole, it does not appear to arise from the kidney. There are bilateral renal cysts, stable from prior. Bilateral nephrolithiasis, nonobstructive, with the largest on on the left measuring 6 mm in the lower pole and the largest on the right measuring 5 mm.  Bladder: No acute abnormality  Reproductive: Prostate enlargement  Bowel: No evidence of primary mass.  Retroperitoneum: 4 cm left perirenal mass as above. There is also a 6 mm posterior pararenal space nodule just above the right iliac crest, measuring 7 mm. There is a 2 cm mass just deep to the right inferior epigastric neurovascular bundle measuring 2 cm. This is likely extraperitoneal. There is contact with bowel which is otherwise unremarkable.  Peritoneum: No free fluid or gas.  Vascular: No acute abnormality.  OSSEOUS: 4 cm aggressive bone lesion in the left iliac crest comminuted anterior superior iliac spine.  Remote appearing T12 compression fracture with distracted, remote appearing T11 spinous process fracture. Grade 2 L5-S1  anterolisthesis.  IMPRESSION Osseous and intra-abdominal metastatic disease. The primary source is not definitively identified in the chest or abdomen. The intra-abdominal pattern of spread is to the retroperitoneum (non-nodal) and right adrenal gland. Is there any indication of melanoma on exam?  SIGNATURE  Electronically Signed   By: Jorje Guild M.D.   On: 06/17/2013 08:25   [name] Contrast  06/17/2013   ADDENDUM REPORT: 06/17/2013 16:09  ADDENDUM: The patient was brought back 06/17/2013 for postcontrast axial imaging according to the [facility] protocol.  This is done for [facility] planning purposes. The left midbrain lesion is again noted. No additional lesions are evident. There is no significant interval change.   Electronically Signed   By: Lawrence Santiago M.D.   On: 06/17/2013 16:09  06/17/2013   CLINICAL DATA:  Brainstem mass.  EXAM: MRI HEAD WITH CONTRAST  TECHNIQUE: Multiplanar, multiecho pulse sequences of the brain and surrounding structures were obtained with intravenous contrast.  COMPARISON:  Noncontrast MRI brain 06/12/2013  CONTRAST:  33mL MULTIHANCE GADOBENATE DIMEGLUMINE 529 MG/ML IV SOLN  FINDINGS: The lesion in the left midbrain demonstrates predominantly peripheral enhancement measuring 2.1 x 2.1 x 1.7 cm. Surrounding vasogenic edema is again seen. The edema extends superiorly in the left cerebral peduncle and along white matter tracts. There is also edema extending inferiorly in the brainstem and left cerebellar peduncle. The enhancement characteristics are most typical of a mass. 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 A focal metastasis is also considered. This does not have the appearance of an infarct. Scattered white matter changes are again noted bilaterally.  IMPRESSION: 1. 2.1 x 2.1 x 1.7 cm peripherally enhancing mass lesion. 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  Metastatic disease is also considered. 2. Atrophy and white matter disease is again noted.  Electronically Signed: By: Lawrence Santiago M.D. On: 06/15/2013 15:54   Ct Abdomen Pelvis W Contrast  06/17/2013   CLINICAL DATA Brain mass.  Rule out metastases.  EXAM CT CHEST, ABDOMEN, AND PELVIS WITH CONTRAST  TECHNIQUE Multidetector CT imaging of the chest, abdomen and pelvis was performed following the standard protocol during bolus administration of intravenous contrast.  CONTRAST 172mL OMNIPAQUE IOHEXOL 300 MG/ML  SOLN  COMPARISON Chest CT 04/07/2011.  Abdominal CT 03/11/2011  FINDINGS CT CHEST FINDINGS  THORACIC INLET/BODY WALL:  No acute abnormality.  MEDIASTINUM:  Cardiomegaly, with notable left atrial enlargement. Extensive atherosclerosis of the coronary arteries, status post CABG. The LIMA graft is not well opacified, but the saphenous vein grafts enhance well. No evidence of acute vascular abnormality.  LUNG WINDOWS:  Low volume lungs. There is chronic cysts in the peripheral right lower lung, with surrounding fibrosis. Bandlike opacities at the right base, with right that pad elevation, most consistent with atelectasis. No metastatic disease.  OSSEOUS:  No acute fracture.  No suspicious lytic or blastic lesions.  CT ABDOMEN AND PELVIS FINDINGS  ABDOMEN/PELVIS:  Liver: 4 cm cyst in the anterior liver. Caudate lobe enlargement without definitive features of cirrhosis on the liver surface.  Biliary: Cholecystectomy.  Pancreas: Unremarkable.  Spleen: Mild granulomatous changes.  Adrenals: 2.6 x 2.4 cm mass in the right adrenal gland. There may have been a small nodule previously, but none of the size.  Kidneys and ureters: There is a 4 cm solid mass in the region of the left lower pole. Although touching the lower pole, it does not appear to arise from the kidney. There are bilateral renal cysts, stable from prior. Bilateral nephrolithiasis, nonobstructive, with the largest on on the left measuring 6 mm in the  lower pole and the largest on the right measuring 5 mm.  Bladder: No acute abnormality  Reproductive: Prostate enlargement  Bowel: No evidence of primary mass.  Retroperitoneum: 4 cm left perirenal mass as above. There is also a 6 mm posterior pararenal space nodule just above the right iliac crest, measuring 7 mm. There is a 2 cm mass just deep to the right inferior epigastric neurovascular bundle measuring 2 cm. This is likely extraperitoneal. There is contact with bowel which is otherwise unremarkable.  Peritoneum: No free fluid or gas.  Vascular: No acute abnormality.  OSSEOUS: 4 cm aggressive bone  lesion in the left iliac crest comminuted anterior superior iliac spine.  Remote appearing T12 compression fracture with distracted, remote appearing T11 spinous process fracture. Grade 2 L5-S1 anterolisthesis.  IMPRESSION Osseous and intra-abdominal metastatic disease. The primary source is not definitively identified in the chest or abdomen. The intra-abdominal pattern of spread is to the retroperitoneum (non-nodal) and right adrenal gland. Is there any indication of melanoma on exam?  SIGNATURE  Electronically Signed   By: Jorje Guild M.D.   On: 06/17/2013 08:25    Impression:    The patient is doing satisfactorily since he completed palliative radiation treatment in our clinic. He is to begin systemic treatment he states in the near future through [facility].  Plan:  The patient will taper his dose of Decadron. This has been discussed with the patient and written instructions were given. Hopefully he will be able to taper off of the steroids within the next 2 weeks. We will have the patient return to clinic for ongoing followup after his next MRI scan of the brain which will be completed in approximately 2 months.    Jodelle Gross, M.D., Ph.D.

## 2013-07-14 ENCOUNTER — Other Ambulatory Visit: Payer: Self-pay | Admitting: Radiation Therapy

## 2013-07-14 DIAGNOSIS — C7949 Secondary malignant neoplasm of other parts of nervous system: Principal | ICD-10-CM

## 2013-07-14 DIAGNOSIS — C7931 Secondary malignant neoplasm of brain: Secondary | ICD-10-CM

## 2013-07-22 ENCOUNTER — Emergency Department (HOSPITAL_COMMUNITY): Payer: Medicare Other

## 2013-07-22 ENCOUNTER — Other Ambulatory Visit: Payer: Self-pay

## 2013-07-22 ENCOUNTER — Observation Stay (HOSPITAL_COMMUNITY)
Admission: EM | Admit: 2013-07-22 | Discharge: 2013-07-23 | Disposition: A | Discharge status: Home / Self Care | Payer: Medicare Other | Attending: Internal Medicine | Admitting: Internal Medicine

## 2013-07-22 ENCOUNTER — Encounter (HOSPITAL_COMMUNITY): Payer: Self-pay | Admitting: Emergency Medicine

## 2013-07-22 DIAGNOSIS — I2510 Atherosclerotic heart disease of native coronary artery without angina pectoris: Secondary | ICD-10-CM | POA: Insufficient documentation

## 2013-07-22 DIAGNOSIS — I509 Heart failure, unspecified: Secondary | ICD-10-CM | POA: Insufficient documentation

## 2013-07-22 DIAGNOSIS — J449 Chronic obstructive pulmonary disease, unspecified: Secondary | ICD-10-CM | POA: Insufficient documentation

## 2013-07-22 DIAGNOSIS — M129 Arthropathy, unspecified: Secondary | ICD-10-CM | POA: Insufficient documentation

## 2013-07-22 DIAGNOSIS — R531 Weakness: Secondary | ICD-10-CM

## 2013-07-22 DIAGNOSIS — C434 Malignant melanoma of scalp and neck: Secondary | ICD-10-CM

## 2013-07-22 DIAGNOSIS — F411 Generalized anxiety disorder: Secondary | ICD-10-CM | POA: Insufficient documentation

## 2013-07-22 DIAGNOSIS — I10 Essential (primary) hypertension: Secondary | ICD-10-CM

## 2013-07-22 DIAGNOSIS — I714 Abdominal aortic aneurysm, without rupture, unspecified: Secondary | ICD-10-CM | POA: Insufficient documentation

## 2013-07-22 DIAGNOSIS — G9389 Other specified disorders of brain: Secondary | ICD-10-CM | POA: Diagnosis present

## 2013-07-22 DIAGNOSIS — R5381 Other malaise: Secondary | ICD-10-CM

## 2013-07-22 DIAGNOSIS — Z8673 Personal history of transient ischemic attack (TIA), and cerebral infarction without residual deficits: Secondary | ICD-10-CM | POA: Insufficient documentation

## 2013-07-22 DIAGNOSIS — C7931 Secondary malignant neoplasm of brain: Secondary | ICD-10-CM

## 2013-07-22 DIAGNOSIS — I200 Unstable angina: Secondary | ICD-10-CM

## 2013-07-22 DIAGNOSIS — C799 Secondary malignant neoplasm of unspecified site: Secondary | ICD-10-CM | POA: Diagnosis present

## 2013-07-22 DIAGNOSIS — C7970 Secondary malignant neoplasm of unspecified adrenal gland: Secondary | ICD-10-CM | POA: Insufficient documentation

## 2013-07-22 DIAGNOSIS — G459 Transient cerebral ischemic attack, unspecified: Secondary | ICD-10-CM

## 2013-07-22 DIAGNOSIS — R5383 Other fatigue: Secondary | ICD-10-CM

## 2013-07-22 DIAGNOSIS — Z86718 Personal history of other venous thrombosis and embolism: Secondary | ICD-10-CM | POA: Insufficient documentation

## 2013-07-22 DIAGNOSIS — Z85820 Personal history of malignant melanoma of skin: Secondary | ICD-10-CM | POA: Insufficient documentation

## 2013-07-22 DIAGNOSIS — C786 Secondary malignant neoplasm of retroperitoneum and peritoneum: Secondary | ICD-10-CM | POA: Insufficient documentation

## 2013-07-22 DIAGNOSIS — Z87891 Personal history of nicotine dependence: Secondary | ICD-10-CM | POA: Insufficient documentation

## 2013-07-22 DIAGNOSIS — N400 Enlarged prostate without lower urinary tract symptoms: Secondary | ICD-10-CM | POA: Insufficient documentation

## 2013-07-22 DIAGNOSIS — Z8711 Personal history of peptic ulcer disease: Secondary | ICD-10-CM | POA: Insufficient documentation

## 2013-07-22 DIAGNOSIS — C7951 Secondary malignant neoplasm of bone: Secondary | ICD-10-CM

## 2013-07-22 DIAGNOSIS — E785 Hyperlipidemia, unspecified: Secondary | ICD-10-CM

## 2013-07-22 DIAGNOSIS — Z7901 Long term (current) use of anticoagulants: Secondary | ICD-10-CM

## 2013-07-22 DIAGNOSIS — I639 Cerebral infarction, unspecified: Secondary | ICD-10-CM

## 2013-07-22 DIAGNOSIS — D649 Anemia, unspecified: Secondary | ICD-10-CM | POA: Insufficient documentation

## 2013-07-22 DIAGNOSIS — J4489 Other specified chronic obstructive pulmonary disease: Secondary | ICD-10-CM | POA: Insufficient documentation

## 2013-07-22 DIAGNOSIS — C7949 Secondary malignant neoplasm of other parts of nervous system: Secondary | ICD-10-CM

## 2013-07-22 DIAGNOSIS — K589 Irritable bowel syndrome without diarrhea: Secondary | ICD-10-CM | POA: Insufficient documentation

## 2013-07-22 DIAGNOSIS — Z860100 Personal history of colon polyps, unspecified: Secondary | ICD-10-CM | POA: Insufficient documentation

## 2013-07-22 DIAGNOSIS — C7952 Secondary malignant neoplasm of bone marrow: Secondary | ICD-10-CM

## 2013-07-22 DIAGNOSIS — Z7982 Long term (current) use of aspirin: Secondary | ICD-10-CM | POA: Insufficient documentation

## 2013-07-22 DIAGNOSIS — I4891 Unspecified atrial fibrillation: Secondary | ICD-10-CM | POA: Diagnosis present

## 2013-07-22 DIAGNOSIS — C439 Malignant melanoma of skin, unspecified: Secondary | ICD-10-CM | POA: Diagnosis present

## 2013-07-22 DIAGNOSIS — E860 Dehydration: Principal | ICD-10-CM

## 2013-07-22 DIAGNOSIS — R0602 Shortness of breath: Secondary | ICD-10-CM | POA: Insufficient documentation

## 2013-07-22 DIAGNOSIS — Z923 Personal history of irradiation: Secondary | ICD-10-CM | POA: Insufficient documentation

## 2013-07-22 DIAGNOSIS — G939 Disorder of brain, unspecified: Secondary | ICD-10-CM

## 2013-07-22 HISTORY — DX: Calculus of kidney: N20.0

## 2013-07-22 HISTORY — DX: Cerebral infarction, unspecified: I63.9

## 2013-07-22 HISTORY — DX: Family history of other specified conditions: Z84.89

## 2013-07-22 HISTORY — DX: Malignant melanoma of skin, unspecified: C43.9

## 2013-07-22 HISTORY — DX: Personal history of other diseases of the digestive system: Z87.19

## 2013-07-22 HISTORY — DX: Gastro-esophageal reflux disease without esophagitis: K21.9

## 2013-07-22 HISTORY — DX: Secondary malignant neoplasm of unspecified site: C79.9

## 2013-07-22 HISTORY — DX: Dependence on supplemental oxygen: Z99.81

## 2013-07-22 LAB — COMPREHENSIVE METABOLIC PANEL
ALBUMIN: 3 g/dL — AB (ref 3.5–5.2)
ALT: 38 U/L (ref 0–53)
AST: 24 U/L (ref 0–37)
Alkaline Phosphatase: 76 U/L (ref 39–117)
BUN: 18 mg/dL (ref 6–23)
CHLORIDE: 96 meq/L (ref 96–112)
CO2: 27 mEq/L (ref 19–32)
Calcium: 9.5 mg/dL (ref 8.4–10.5)
Creatinine, Ser: 1 mg/dL (ref 0.50–1.35)
GFR calc Af Amer: 76 mL/min — ABNORMAL LOW (ref >90)
GFR calc non Af Amer: 65 mL/min — ABNORMAL LOW (ref >90)
Glucose, Bld: 94 mg/dL (ref 70–99)
Potassium: 3.9 mEq/L (ref 3.7–5.3)
Sodium: 138 mEq/L (ref 137–147)
TOTAL PROTEIN: 5.8 g/dL — AB (ref 6.0–8.3)
Total Bilirubin: 0.5 mg/dL (ref 0.3–1.2)

## 2013-07-22 LAB — TROPONIN I

## 2013-07-22 LAB — CBC WITH DIFFERENTIAL/PLATELET
Basophils Absolute: 0 10*3/uL (ref 0.0–0.1)
Basophils Relative: 0 % (ref 0–1)
Eosinophils Absolute: 0.1 10*3/uL (ref 0.0–0.7)
Eosinophils Relative: 1 % (ref 0–5)
HCT: 35.7 % — ABNORMAL LOW (ref 39.0–52.0)
HEMOGLOBIN: 11.4 g/dL — AB (ref 13.0–17.0)
LYMPHS ABS: 0.5 10*3/uL — AB (ref 0.7–4.0)
Lymphocytes Relative: 8 % — ABNORMAL LOW (ref 12–46)
MCH: 26.1 pg (ref 26.0–34.0)
MCHC: 31.9 g/dL (ref 30.0–36.0)
MCV: 81.7 fL (ref 78.0–100.0)
Monocytes Absolute: 0.4 10*3/uL (ref 0.1–1.0)
Monocytes Relative: 5 % (ref 3–12)
NEUTROS ABS: 5.8 10*3/uL (ref 1.7–7.7)
NEUTROS PCT: 86 % — AB (ref 43–77)
PLATELETS: 126 10*3/uL — AB (ref 150–400)
RBC: 4.37 MIL/uL (ref 4.22–5.81)
RDW: 19.6 % — ABNORMAL HIGH (ref 11.5–15.5)
WBC: 6.7 10*3/uL (ref 4.0–10.5)

## 2013-07-22 LAB — PROTIME-INR
INR: 0.94 (ref 0.00–1.49)
PROTHROMBIN TIME: 12.4 s (ref 11.6–15.2)

## 2013-07-22 LAB — URINALYSIS, ROUTINE W REFLEX MICROSCOPIC
BILIRUBIN URINE: NEGATIVE
GLUCOSE, UA: NEGATIVE mg/dL
HGB URINE DIPSTICK: NEGATIVE
KETONES UR: NEGATIVE mg/dL
LEUKOCYTES UA: NEGATIVE
Nitrite: NEGATIVE
PROTEIN: NEGATIVE mg/dL
Specific Gravity, Urine: 1.015 (ref 1.005–1.030)
Urobilinogen, UA: 0.2 mg/dL (ref 0.0–1.0)
pH: 7 (ref 5.0–8.0)

## 2013-07-22 LAB — I-STAT CG4 LACTIC ACID, ED: LACTIC ACID, VENOUS: 2.08 mmol/L (ref 0.5–2.2)

## 2013-07-22 LAB — PRO B NATRIURETIC PEPTIDE: Pro B Natriuretic peptide (BNP): 758.8 pg/mL — ABNORMAL HIGH (ref 0–450)

## 2013-07-22 LAB — TSH: TSH: 0.184 u[IU]/mL — ABNORMAL LOW (ref 0.350–4.500)

## 2013-07-22 MED ORDER — ASPIRIN EC 81 MG PO TBEC
81.0000 mg | DELAYED_RELEASE_TABLET | Freq: Every day | ORAL | Status: DC
  Administered 2013-07-23: 81 mg via ORAL
  Filled 2013-07-22: qty 1

## 2013-07-22 MED ORDER — ALBUTEROL SULFATE (2.5 MG/3ML) 0.083% IN NEBU: 2.5000 mg | INHALATION_SOLUTION | Freq: Four times a day (QID) | RESPIRATORY_TRACT | Status: DC | PRN

## 2013-07-22 MED ORDER — CITALOPRAM HYDROBROMIDE 10 MG PO TABS
10.0000 mg | ORAL_TABLET | Freq: Every day | ORAL | Status: DC
  Administered 2013-07-23: 10 mg via ORAL
  Filled 2013-07-22: qty 1

## 2013-07-22 MED ORDER — ATORVASTATIN CALCIUM 40 MG PO TABS
40.0000 mg | ORAL_TABLET | Freq: Every day | ORAL | Status: DC
  Administered 2013-07-23: 40 mg via ORAL
  Filled 2013-07-22: qty 1

## 2013-07-22 MED ORDER — ADULT MULTIVITAMIN W/MINERALS CH
1.0000 | ORAL_TABLET | Freq: Every evening | ORAL | Status: DC
  Filled 2013-07-22: qty 1

## 2013-07-22 MED ORDER — ALBUTEROL SULFATE HFA 108 (90 BASE) MCG/ACT IN AERS: 2.0000 | INHALATION_SPRAY | Freq: Four times a day (QID) | RESPIRATORY_TRACT | Status: DC | PRN

## 2013-07-22 MED ORDER — PROMETHAZINE HCL 12.5 MG PO TABS
12.5000 mg | ORAL_TABLET | Freq: Every day | ORAL | Status: DC
  Administered 2013-07-22: 12.5 mg via ORAL
  Filled 2013-07-22 (×3): qty 1

## 2013-07-22 MED ORDER — SODIUM CHLORIDE 0.9 % IV BOLUS (SEPSIS)
500.0000 mL | Freq: Once | INTRAVENOUS | Status: AC
Start: 2013-07-22 — End: 2013-07-22
  Administered 2013-07-22: 500 mL via INTRAVENOUS

## 2013-07-22 MED ORDER — PROMETHAZINE HCL 12.5 MG PO TABS
12.5000 mg | ORAL_TABLET | Freq: Every day | ORAL | Status: DC
  Filled 2013-07-22: qty 1

## 2013-07-22 MED ORDER — MECLIZINE HCL 25 MG PO TABS
25.0000 mg | ORAL_TABLET | Freq: Every day | ORAL | Status: DC
  Administered 2013-07-23: 25 mg via ORAL
  Filled 2013-07-22: qty 1

## 2013-07-22 MED ORDER — ENOXAPARIN SODIUM 40 MG/0.4ML ~~LOC~~ SOLN
40.0000 mg | SUBCUTANEOUS | Status: DC
  Administered 2013-07-22: 40 mg via SUBCUTANEOUS
  Filled 2013-07-22 (×2): qty 0.4

## 2013-07-22 MED ORDER — IOHEXOL 350 MG/ML SOLN
100.0000 mL | Freq: Once | INTRAVENOUS | Status: AC | PRN
  Administered 2013-07-22: 100 mL via INTRAVENOUS

## 2013-07-22 MED ORDER — SODIUM CHLORIDE 0.9 % IV SOLN
INTRAVENOUS | Status: AC
  Administered 2013-07-22: 19:00:00 via INTRAVENOUS

## 2013-07-22 MED ORDER — DEXAMETHASONE 4 MG PO TABS
4.0000 mg | ORAL_TABLET | Freq: Two times a day (BID) | ORAL | Status: DC
  Administered 2013-07-22 – 2013-07-23 (×2): 4 mg via ORAL
  Filled 2013-07-22 (×3): qty 1

## 2013-07-22 MED ORDER — NITROGLYCERIN 0.4 MG SL SUBL: 0.4000 mg | SUBLINGUAL_TABLET | SUBLINGUAL | Status: DC | PRN

## 2013-07-22 MED ORDER — SODIUM CHLORIDE 0.9 % IV BOLUS (SEPSIS)
500.0000 mL | Freq: Once | INTRAVENOUS | Status: AC
  Administered 2013-07-22: 500 mL via INTRAVENOUS

## 2013-07-22 MED ORDER — BUDESONIDE-FORMOTEROL FUMARATE 160-4.5 MCG/ACT IN AERO
2.0000 | INHALATION_SPRAY | Freq: Two times a day (BID) | RESPIRATORY_TRACT | Status: DC
  Administered 2013-07-23: 2 via RESPIRATORY_TRACT
  Filled 2013-07-22 (×2): qty 6

## 2013-07-22 MED ORDER — METOPROLOL SUCCINATE 12.5 MG HALF TABLET
12.5000 mg | ORAL_TABLET | Freq: Every day | ORAL | Status: DC
  Administered 2013-07-23: 12.5 mg via ORAL
  Filled 2013-07-22: qty 1

## 2013-07-22 MED ORDER — DOCUSATE SODIUM 100 MG PO CAPS
100.0000 mg | ORAL_CAPSULE | Freq: Every day | ORAL | Status: DC
  Administered 2013-07-22: 100 mg via ORAL
  Filled 2013-07-22: qty 1

## 2013-07-22 MED ORDER — TEMOZOLOMIDE 20 MG PO CAPS
140.0000 mg | ORAL_CAPSULE | Freq: Every day | ORAL | Status: DC
  Administered 2013-07-22: 140 mg via ORAL
  Filled 2013-07-22: qty 2

## 2013-07-22 NOTE — ED Provider Notes (Signed)
Medical screening examination/treatment/procedure(s) were conducted as a shared visit with non-physician practitioner(s) and myself.  I personally evaluated the patient during the encounter.  Metastatic melanoma with worsening weakness and SOB after starting PO chemothearpy 1 week ago.  No fever, chest pain, cough.  Mildly increased work of breathing. Lungs clear.  Edema to feet bilaterally. UA neg, CXR negative.  No PE on CT. Patient DNR, family aware of diffuse mets. Follows at [facility], no hospice discussion yet. Weak and unable to walk. Hypothermia improved. No evidence of infection.   EKG Interpretation   Date/Time:  Thursday July 22 2013 10:14:55 EDT Ventricular Rate:  104 PR Interval:    QRS Duration: 96 QT Interval:  330 QTC Calculation: 433 R Axis:   -13 Text Interpretation:  Atrial fibrillation Incomplete right bundle branch  block Nonspecific ST and T wave abnormality Abnormal ECG Confirmed by  Wyvonnia Dusky  MD, Latisha Lasch [phone]) on 07/22/2013 11:21:56 AM        Gabriel Essex, MD 07/22/13 2028

## 2013-07-22 NOTE — ED Notes (Signed)
Dr Wyvonnia Dusky removed the bair [employer].

## 2013-07-22 NOTE — ED Notes (Signed)
Patient transported to CT 

## 2013-07-22 NOTE — ED Notes (Signed)
Bair hugger placed on pt.

## 2013-07-22 NOTE — ED Notes (Signed)
Dr Wyvonnia Dusky notified of 95.2 rectal temp.  Given warm blankets.

## 2013-07-22 NOTE — ED Provider Notes (Signed)
CSN: [id number]     Arrival date & time 07/22/13  1009 History   First MD Initiated Contact with Patient 07/22/13 1022     Chief Complaint  Patient presents with  . Shortness of Breath  . Weakness     (Consider location/radiation/quality/duration/timing/severity/associated sxs/prior Treatment) The history is provided by the patient and a relative. No language interpreter was used.  Gabriel Wood is an 80 year old male with past medical history of colon polyps, anemia, chronic kidney disease, ulcer, BPH, anxiety, coronary artery disease, CABG, AAA, A. fib, hypertension, hyperlipidemia, TIA, melanoma presenting to the emergency department with shortness of breath that started yesterday. Reported that he has oxygen at home used as needed for shortness of breath. Stated that he's been having a mild productive cough. Stated he's been feeling nauseous and has been using Phenergan with relief. Patient currently on chemotherapy, Tumador, that he takes approximately once per day. Denied chest pain, vomiting, diarrhea, neck pain, back pain, abdominal pain, weakness, numbness, tingling, nasal congestion, fever, sweating, chills, diaphoresis. PCP Dr. Doran Stabler  Past Medical History  Diagnosis Date  . History of colon polyps   . Anemia   . Chronic kidney disease     History of Kidney Stones  . Ulcer     history of Peptic Ulcer Disease  . IBS (irritable bowel syndrome)   . Anxiety   . BPH (benign prostatic hypertrophy)   . CAD (coronary artery disease)   . Atrial fibrillation   . AAA (abdominal aortic aneurysm)   . Arthritis   . Chronic anticoagulation     On Coumadin therapy ( A Fib)  . Hypertension   . Hyperlipidemia   . History of DVT of lower extremity   . Cancer Sept. 2013    Melanoma,Right  posterior shoulder  . Anginal pain   . Shortness of breath   . Transient ischemic attack (TIA) 07/2012  . Fall at home Dec. 2014  . CHF (congestive heart failure)   . COPD (chronic obstructive  pulmonary disease)   . History of radiation therapy 06/30/13 completed      5/5 srs brain   Past Surgical History  Procedure Laterality Date  . Appendectomy    . Cholecystectomy    . Tonsillectomy    . Rotator cuff repair      Right shoulder  . Coronary artery bypass graft    . Appendectomy    . Fracture surgery  2011    Left tibial ORIF by Dr. Sharol Given  . Ankle fusion  2011    Left ankle  . Lymph node biopsy    . Joint replacement Left Dec.  2014    Left shoulder replacement  . Kidney stones     Family History  Problem Relation Age of Onset  . Cancer Mother     stomach cancer  . Heart disease Brother   . Heart disease Brother   . Heart disease Brother    History  Substance Use Topics  . Smoking status: Former Smoker    Quit date: 04/08/1996  . Smokeless tobacco: Never Used  . Alcohol Use: No     Comment: heavy alcohol use in the past    Review of Systems  Constitutional: Negative for fever and chills.  HENT: Negative for trouble swallowing.   Eyes: Negative for visual disturbance.  Respiratory: Positive for shortness of breath. Negative for chest tightness.   Cardiovascular: Negative for chest pain.  Gastrointestinal: Negative for nausea, vomiting and abdominal pain.  Neurological:  Positive for weakness. Negative for dizziness.  All other systems reviewed and are negative.     Allergies  Morphine and related and Codeine  Home Medications   Prior to Admission medications   Medication Sig Start Date End Date Taking? Authorizing Provider  acetaminophen (TYLENOL) 650 MG CR tablet Take 1,300 mg by mouth every 8 (eight) hours as needed for pain.    Historical Provider, MD  albuterol (PROVENTIL HFA;VENTOLIN HFA) 108 (90 BASE) MCG/ACT inhaler Inhale 2 puffs into the lungs every 6 (six) hours as needed for wheezing or shortness of breath.    Historical Provider, MD  aspirin EC 81 MG tablet Take 81 mg by mouth daily.    Historical Provider, MD  atorvastatin (LIPITOR)  40 MG tablet Take 40 mg by mouth daily.  02/12/11   Historical Provider, MD  budesonide-formoterol (SYMBICORT) 160-4.5 MCG/ACT inhaler Inhale 2 puffs into the lungs 2 (two) times daily.    Historical Provider, MD  citalopram (CELEXA) 10 MG tablet Take 10 mg by mouth daily.  03/07/11   Historical Provider, MD  dexamethasone (DECADRON) 4 MG tablet Take 1 tablet (4 mg total) by mouth 2 (two) times daily. 06/18/13   Eugenie Filler, MD  feeding supplement (ENSURE COMPLETE) LIQD Take 237 mLs by mouth 2 (two) times daily between meals. Available over the counter 07/31/12   Ripudeep K Rai, MD  furosemide (LASIX) 20 MG tablet Take 20 mg by mouth as needed for fluid.    Historical Provider, MD  meclizine (ANTIVERT) 25 MG tablet Take 25 mg by mouth daily.     Historical Provider, MD  metoprolol succinate (TOPROL-XL) 25 MG 24 hr tablet Take 0.5 tablets (12.5 mg total) by mouth daily. 06/18/13   Eugenie Filler, MD  Multiple Vitamin (MULTIVITAMIN WITH MINERALS) TABS Take 1 tablet by mouth every evening.    Historical Provider, MD  nitroGLYCERIN (NITROSTAT) 0.4 MG SL tablet Place 0.4 mg under the tongue every 5 (five) minutes as needed for chest pain.    Historical Provider, MD  promethazine (PHENERGAN) 25 MG tablet Take 1 tablet (25 mg total) by mouth every 6 (six) hours as needed for nausea. 07/30/12   Cletus Gash, MD   BP 119/49  Pulse 72  Temp(Src) 97.8 F (36.6 C) (Rectal)  Resp 19  SpO2 100% Physical Exam  Nursing note and vitals reviewed. Constitutional: He is oriented to person, place, and time. He appears well-developed and well-nourished. No distress.  HENT:  Head: Normocephalic and atraumatic.  Mouth/Throat: Oropharynx is clear and moist. No oropharyngeal exudate.  Eyes: Conjunctivae and EOM are normal. Pupils are equal, round, and reactive to light. Right eye exhibits no discharge. Left eye exhibits no discharge.  Neck: Normal range of motion. Neck supple. No tracheal deviation present.   Cardiovascular: Normal rate, regular rhythm and normal heart sounds.  Exam reveals no friction rub.   No murmur heard. Pulses:      Radial pulses are 2+ on the right side, and 2+ on the left side.       Dorsalis pedis pulses are 2+ on the right side, and 2+ on the left side.  Swelling identified to lower extremities 2+ pitting edema to the feet dorsal aspect bilaterally  Pulmonary/Chest: Effort normal. No respiratory distress. He has no wheezes. He has no rales. He exhibits no tenderness.  Shortness of breath noted Patient has to take a deep breath at the end of each sentence Negative stridor Negative use of accessory muscle  Abdominal:  Soft. Bowel sounds are normal. There is no tenderness. There is no guarding.  Rash identified to the abdomen-scab like  Musculoskeletal: Normal range of motion.  Full ROM to upper and lower extremities without difficulty noted, negative ataxia noted.  Lymphadenopathy:    He has no cervical adenopathy.  Neurological: He is alert and oriented to person, place, and time. No cranial nerve deficit. He exhibits normal muscle tone. Coordination normal.  Skin: Skin is warm and dry. No rash noted. He is not diaphoretic. No erythema.  Psychiatric: He has a normal mood and affect. His behavior is normal. Thought content normal.    ED Course  Procedures (including critical care time)  5:11 PM Attending physician spoke with Dr. Eliseo Squires, [facility] - discussed case in great detail. Patient to be admitted to the hospital, [place].   Results for orders placed during the hospital encounter of 07/22/13  COMPREHENSIVE METABOLIC PANEL      Result Value Ref Range   Sodium 138  137 - 147 mEq/L   Potassium 3.9  3.7 - 5.3 mEq/L   Chloride 96  96 - 112 mEq/L   CO2 27  19 - 32 mEq/L   Glucose, Bld 94  70 - 99 mg/dL   BUN 18  6 - 23 mg/dL   Creatinine, Ser 1.00  0.50 - 1.35 mg/dL   Calcium 9.5  8.4 - 10.5 mg/dL   Total Protein 5.8 (*) 6.0 - 8.3 g/dL   Albumin 3.0  (*) 3.5 - 5.2 g/dL   AST 24  0 - 37 U/L   ALT 38  0 - 53 U/L   Alkaline Phosphatase 76  39 - 117 U/L   Total Bilirubin 0.5  0.3 - 1.2 mg/dL   GFR calc non Af Amer 65 (*) >90 mL/min   GFR calc Af Amer 76 (*) >90 mL/min  TROPONIN I      Result Value Ref Range   Troponin I <0.30  <0.30 ng/mL  PROTIME-INR      Result Value Ref Range   Prothrombin Time 12.4  11.6 - 15.2 seconds   INR 0.94  0.00 - 1.49  PRO B NATRIURETIC PEPTIDE      Result Value Ref Range   Pro B Natriuretic peptide (BNP) 758.8 (*) 0 - 450 pg/mL  URINALYSIS, ROUTINE W REFLEX MICROSCOPIC      Result Value Ref Range   Color, Urine YELLOW  YELLOW   APPearance CLEAR  CLEAR   Specific Gravity, Urine 1.015  1.005 - 1.030   pH 7.0  5.0 - 8.0   Glucose, UA NEGATIVE  NEGATIVE mg/dL   Hgb urine dipstick NEGATIVE  NEGATIVE   Bilirubin Urine NEGATIVE  NEGATIVE   Ketones, ur NEGATIVE  NEGATIVE mg/dL   Protein, ur NEGATIVE  NEGATIVE mg/dL   Urobilinogen, UA 0.2  0.0 - 1.0 mg/dL   Nitrite NEGATIVE  NEGATIVE   Leukocytes, UA NEGATIVE  NEGATIVE  CBC WITH DIFFERENTIAL      Result Value Ref Range   WBC 6.7  4.0 - 10.5 K/uL   RBC 4.37  4.22 - 5.81 MIL/uL   Hemoglobin 11.4 (*) 13.0 - 17.0 g/dL   HCT 35.7 (*) 39.0 - 52.0 %   MCV 81.7  78.0 - 100.0 fL   MCH 26.1  26.0 - 34.0 pg   MCHC 31.9  30.0 - 36.0 g/dL   RDW 19.6 (*) 11.5 - 15.5 %   Platelets 126 (*) 150 - 400 K/uL   Neutrophils  Relative % 86 (*) 43 - 77 %   Neutro Abs 5.8  1.7 - 7.7 K/uL   Lymphocytes Relative 8 (*) 12 - 46 %   Lymphs Abs 0.5 (*) 0.7 - 4.0 K/uL   Monocytes Relative 5  3 - 12 %   Monocytes Absolute 0.4  0.1 - 1.0 K/uL   Eosinophils Relative 1  0 - 5 %   Eosinophils Absolute 0.1  0.0 - 0.7 K/uL   Basophils Relative 0  0 - 1 %   Basophils Absolute 0.0  0.0 - 0.1 K/uL  I-STAT CG4 LACTIC ACID, ED      Result Value Ref Range   Lactic Acid, Venous 2.08  0.5 - 2.2 mmol/L    Labs Review Labs Reviewed  COMPREHENSIVE METABOLIC PANEL - Abnormal; Notable for  the following:    Total Protein 5.8 (*)    Albumin 3.0 (*)    GFR calc non Af Amer 65 (*)    GFR calc Af Amer 76 (*)    All other components within normal limits  PRO B NATRIURETIC PEPTIDE - Abnormal; Notable for the following:    Pro B Natriuretic peptide (BNP) 758.8 (*)    All other components within normal limits  CBC WITH DIFFERENTIAL - Abnormal; Notable for the following:    Hemoglobin 11.4 (*)    HCT 35.7 (*)    RDW 19.6 (*)    Platelets 126 (*)    Neutrophils Relative % 86 (*)    Lymphocytes Relative 8 (*)    Lymphs Abs 0.5 (*)    All other components within normal limits  URINE CULTURE  CULTURE, BLOOD (ROUTINE X 2)  CULTURE, BLOOD (ROUTINE X 2)  TROPONIN I  PROTIME-INR  URINALYSIS, ROUTINE W REFLEX MICROSCOPIC  CBC WITH DIFFERENTIAL  I-STAT CG4 LACTIC ACID, ED    Imaging Review Ct Angio Chest Pe W/cm &/or Wo Cm  07/22/2013   CLINICAL DATA:  Short of breath and weakness. Concern for pulmonary embolism or aortic dissection. Prior history of melanoma.  EXAM: CT ANGIOGRAPHY CHEST, ABDOMEN AND PELVIS  TECHNIQUE: Multidetector CT imaging through the chest, abdomen and pelvis was performed using the standard protocol during bolus administration of intravenous contrast. Multiplanar reconstructed images and MIPs were obtained and reviewed to evaluate the vascular anatomy.  CONTRAST:  161mL OMNIPAQUE IOHEXOL 350 MG/ML SOLN  COMPARISON:  CT ABD/PELVIS W CM dated 06/16/2013  FINDINGS: CTA CHEST:  No filling defects within the pulmonary arteries to suggest pulmonary embolism. The right lower lobe is severely atelectatic suggesting prior surgery.  There is no evidence of dissection of the aorta thoracic aorta. Great vessels are normal. There is extensive intimal calcification of the aorta. Consider coronary calcification. There is a coronary bypass graft anatomy.  Cardiac fluid. Esophagus is normal. No mediastinal lymphadenopathy. No axillary or supraclavicular adenopathy.  Scattered  ground-glass opacities in the lungs and paraseptal emphysema. Again there is volume loss of the right lower lobe. There is the thick-walled cavitary lesion in the pleural space on the right base which is unchanged prior. Stable back to 2012.  Review of the MIP images confirms the above findings.  CTA ABDOMEN AND PELVIS FINDINGS  No evidence of dissection or aneurysm abdominal aorta or iliac arteries. Heavy intimal calcification is present. SMA and celiac trunk is are patent and heavily calcified. Renal arteries are patent.  Low-density lesion in the right hepatic lobe is unchanged. Post cholecystectomy. The pancreas, spleen, left adrenal gland are normal. There is nodular  enlargement of the right adrenal gland again demonstrated measuring 33 x 25 mm compared to 30 x 22 mm on prior.  Stomach, small bowel, and colon are unremarkable.  No retroperitoneal lymphadenopathy. There is a lobular mass extending from the lower pole of the left kidney which likely represents a retroperitoneal metastasis measuring 4.2 x 3.3 cm slightly incerased from 4.0 x 2.8 cm on most recent exam. Small retroperitoneal implant on the right measures 7 mm unchanged.  The prostate gland and bladder are unchanged. Prostate hypertrophy noted. Bilateral inguinal hernias. No aggressive osseous lesion.  Review of the MIP images confirms the above findings.  IMPRESSION: 1. No evidence of pulmonary embolism. 2. No evidence of aortic dissection. 3. Stable chronic changes within the lungs including volume loss, atelectasis ground-glass opacities. 4. Interval increase size of retroperitoneal metastasis inferior to the left kidney as well as mild increase in right adrenal metastasis. These findings are compared to 06/16/2013.   Electronically Signed   By: Suzy Bouchard M.D.   On: 07/22/2013 15:32   Dg Chest Portable 1 View  07/22/2013   CLINICAL DATA:  Shortness of breath, cough, congestion  EXAM: PORTABLE CHEST - 1 VIEW  COMPARISON:  CT chest dated  06/16/2013  FINDINGS: Volume loss in the right hemithorax with associated right basilar atelectasis/scarring. Left lung is essentially clear. No pleural effusion or pneumothorax.  The heart is normal size. Postsurgical changes related to prior CABG.  Surgical clips in the right supraclavicular region.  Left shoulder arthroplasty.  Cholecystectomy clips.  IMPRESSION: Volume loss in the right hemithorax with associated right basilar atelectasis/scarring.  No evidence of acute cardiopulmonary disease.   Electronically Signed   By: Julian Hy M.D.   On: 07/22/2013 11:15   Ct Angio Abd/pel W/ And/or W/o  07/22/2013   CLINICAL DATA:  Short of breath and weakness. Concern for pulmonary embolism or aortic dissection. Prior history of melanoma.  EXAM: CT ANGIOGRAPHY CHEST, ABDOMEN AND PELVIS  TECHNIQUE: Multidetector CT imaging through the chest, abdomen and pelvis was performed using the standard protocol during bolus administration of intravenous contrast. Multiplanar reconstructed images and MIPs were obtained and reviewed to evaluate the vascular anatomy.  CONTRAST:  158mL OMNIPAQUE IOHEXOL 350 MG/ML SOLN  COMPARISON:  CT ABD/PELVIS W CM dated 06/16/2013  FINDINGS: CTA CHEST:  No filling defects within the pulmonary arteries to suggest pulmonary embolism. The right lower lobe is severely atelectatic suggesting prior surgery.  There is no evidence of dissection of the aorta thoracic aorta. Great vessels are normal. There is extensive intimal calcification of the aorta. Consider coronary calcification. There is a coronary bypass graft anatomy.  Cardiac fluid. Esophagus is normal. No mediastinal lymphadenopathy. No axillary or supraclavicular adenopathy.  Scattered ground-glass opacities in the lungs and paraseptal emphysema. Again there is volume loss of the right lower lobe. There is the thick-walled cavitary lesion in the pleural space on the right base which is unchanged prior. Stable back to 2012.  Review of  the MIP images confirms the above findings.  CTA ABDOMEN AND PELVIS FINDINGS  No evidence of dissection or aneurysm abdominal aorta or iliac arteries. Heavy intimal calcification is present. SMA and celiac trunk is are patent and heavily calcified. Renal arteries are patent.  Low-density lesion in the right hepatic lobe is unchanged. Post cholecystectomy. The pancreas, spleen, left adrenal gland are normal. There is nodular enlargement of the right adrenal gland again demonstrated measuring 33 x 25 mm compared to 30 x 22 mm on prior.  Stomach, small bowel, and colon are unremarkable.  No retroperitoneal lymphadenopathy. There is a lobular mass extending from the lower pole of the left kidney which likely represents a retroperitoneal metastasis measuring 4.2 x 3.3 cm slightly incerased from 4.0 x 2.8 cm on most recent exam. Small retroperitoneal implant on the right measures 7 mm unchanged.  The prostate gland and bladder are unchanged. Prostate hypertrophy noted. Bilateral inguinal hernias. No aggressive osseous lesion.  Review of the MIP images confirms the above findings.  IMPRESSION: 1. No evidence of pulmonary embolism. 2. No evidence of aortic dissection. 3. Stable chronic changes within the lungs including volume loss, atelectasis ground-glass opacities. 4. Interval increase size of retroperitoneal metastasis inferior to the left kidney as well as mild increase in right adrenal metastasis. These findings are compared to 06/16/2013.   Electronically Signed   By: Suzy Bouchard M.D.   On: 07/22/2013 15:32     EKG Interpretation   Date/Time:  Thursday July 22 2013 10:14:55 EDT Ventricular Rate:  104 PR Interval:    QRS Duration: 96 QT Interval:  330 QTC Calculation: 433 R Axis:   -13 Text Interpretation:  Atrial fibrillation Incomplete right bundle branch  block Nonspecific ST and T wave abnormality Abnormal ECG Confirmed by  Wyvonnia Dusky  MD, STEPHEN [phone]) on 07/22/2013 11:21:56 AM      MDM    Final diagnoses:  Weakness  Melanoma  Metastasis  Dehydration   Medications  0.9 %  sodium chloride infusion (not administered)  sodium chloride 0.9 % bolus 500 mL (0 mLs Intravenous Stopped 07/22/13 1239)  iohexol (OMNIPAQUE) 350 MG/ML injection 100 mL (100 mLs Intravenous Contrast Given 07/22/13 1423)  sodium chloride 0.9 % bolus 500 mL (0 mLs Intravenous Stopped 07/22/13 1719)   Filed Vitals:   07/22/13 1600 07/22/13 1615 07/22/13 1630 07/22/13 1645  BP: 107/48 116/59 127/63 119/49  Pulse:  75 76 72  Temp:      TempSrc:      Resp: 24 24 25 19   SpO2: 95% 100% 100% 100%    Patient presenting to the ED with shortness of breath that started yesterday. Patient reported that he has to use his oxygen as needed-reported that he had to be on oxygen all day yesterday while at home. Patient has history of myeloma with metastasis currently on a chemotherapy. Patient was diagnosed back in February of 2014.  Alert and oriented. GCS 15. Heart rate and rhythm normal. Lungs good auscultation to upper and lower lobes bilaterally. Bowel sounds normoactive in all 4 quadrants, soft upon palpation. Swelling identified to lower extremities particularly to the dorsal aspect of the feet bilaterally with 2+ pitting edema. EKG noted atrial fibrillation, incomplete right bundle branch block with nonspecific ST and T wave abnormalities-heart rate of 104 beats per minute. Troponin negative elevation. CBC noted negative elevated white blood cell count CMP noted kidneys to be functioning well-BUN and creatinine within normal limits, AST, ALT and alkaline phosphatase within normal limits. GFR 76. BNP elevated at 758.8. Protime INR negative elevation, PT negative elevation. Urinalysis negative hemoglobin, negative nitrites and leukocytes identified. Lactic acid negative elevation. Chest x-ray portable with volume loss in the right hemothorax with associated right basilar atelectasis or scarring-no evidence of acute cardia  pulmonary disease. Urine culture and blood culture pending. CT angiogram of chest and abdomen negative for pulmonary embolism, no evidence of aortic dissection. Stable chronic changes within the lungs, atelectasis groundglass opacities noted. Interval increased size of retroperitoneal metastasis inferior to the left kidney  as well as mild increase in right adrenal metastasis these findings are compared to 06/16/2013. Patient appears to be hypothermic with a temperature of 95.10F - bair hugger placed. After 1 L of fluids the IV patient's blood pressure has increased from 97/51 to 127/63 with a pulse ox of 100% while on 2 L per minute. Tachypnea noted at 25 minute. Temperature increased to 97.4F. Hospice not involved. Taking chemo at home. Patient unable to ambulate. Patient to be admitted for weakness, hypotensive, tachypnea and hypoxia. Patient dehydrated. CT identified increased metastases to kidneys and adrenal glands, when compared to imaging performed on 06/16/2013. Negative findings of PE on CT angio. Negative involvement or signs of AAA dissection. Discussed plan for admission with patient and family - agreed to plan of care. Patient to be admitted to [facility] as inpatient.   Jamse Mead, PA-C 07/22/13 [phone]

## 2013-07-22 NOTE — Progress Notes (Signed)
Admitted to room 6n32, patient is alert and oriented , SOB noted with exertion on oxygen at 2LPM sat 97%. Family at bedside. Plan of care explained to the family and the patient. Oriented family and patient with the room and unit set up.

## 2013-07-22 NOTE — ED Notes (Signed)
NOTIFIED DR. KOHUT OF PATIENTS LAB RESULTS OF CG4+ LACTIC ACID ,07/22/2013. 

## 2013-07-22 NOTE — H&P (Signed)
[facility] History and Physical  [name] FGH:[id number] DOB: Jan 14, 1924 DOA: 07/22/2013  Referring physician: er PCP: Gabriel Huh, MD   Chief Complaint: fatigue, SOB  HPI: Gabriel Wood is a 80 y.o. male  Who came to the ER with SOB and fatigue.  He has not been feeling well since he began taking chemo about 6 days ago.  He has metastatic melanoma and follows for chemo at [facility]). He was more fatigued this AM and SOB.  He has O2 at home and has been having to wear it continuously.  He had an episode of nausea this AM as well but not vomiting.     Family reports that he has had LE edema but this has actually improved in the ER after fluid administration.  No fever, no chills Walks with a walker at baseline.  Eating ok per family.  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 Sick "stomach" flu contact about 1- days ago- no diarrhea  Work up in ER showed hypothermia and low BP but labs otherwise unremarkable   Review of Systems:  All systems reviewed, negative unless stated above    Past Medical History  Diagnosis Date  . History of colon polyps   . Anemia   . Chronic kidney disease     History of Kidney Stones  . Ulcer     history of Peptic Ulcer Disease  . IBS (irritable bowel syndrome)   . Anxiety   . BPH (benign prostatic hypertrophy)   . CAD (coronary artery disease)   . Atrial fibrillation   . AAA (abdominal aortic aneurysm)   . Arthritis   . Chronic anticoagulation     On Coumadin therapy ( A Fib)  . Hypertension   . Hyperlipidemia   . History of DVT of lower extremity   . Cancer Sept. 2013    Melanoma,Right  posterior shoulder  . Anginal pain   . Shortness of breath   . Transient ischemic attack (TIA) 07/2012  . Fall at home Dec. 2014  . CHF (congestive heart failure)   . COPD (chronic obstructive pulmonary disease)   . History of radiation therapy 06/30/13 completed      5/5  srs brain   Past Surgical History  Procedure Laterality Date  . Appendectomy    . Cholecystectomy    . Tonsillectomy    . Rotator cuff repair      Right shoulder  . Coronary artery bypass graft    . Appendectomy    . Fracture surgery  2011    Left tibial ORIF by Gabriel Wood  . Ankle fusion  2011    Left ankle  . Lymph node biopsy    . Joint replacement Left Dec.  2014    Left shoulder replacement  . Kidney stones     Social History:  reports that he quit smoking about 17 years ago. He has never used smokeless tobacco. He reports that he does not drink alcohol or use illicit drugs.  Allergies  Allergen Reactions  . Morphine And Related Palpitations  . Codeine Nausea Only    Family History  Problem Relation Age of Onset  . Cancer Mother     stomach cancer  . Heart disease Brother   . Heart disease Brother   . Heart disease Brother      Prior to Admission medications   Medication Sig Start Date End Date Taking? Authorizing  Provider  acetaminophen (TYLENOL) 650 MG CR tablet Take 1,300 mg by mouth every 8 (eight) hours as needed for pain.   Yes Historical Provider, MD  albuterol (PROVENTIL HFA;VENTOLIN HFA) 108 (90 BASE) MCG/ACT inhaler Inhale 2 puffs into the lungs every 6 (six) hours as needed for wheezing or shortness of breath.   Yes Historical Provider, MD  aspirin EC 81 MG tablet Take 81 mg by mouth daily.   Yes Historical Provider, MD  atorvastatin (LIPITOR) 40 MG tablet Take 40 mg by mouth daily.  02/12/11  Yes Historical Provider, MD  budesonide-formoterol (SYMBICORT) 160-4.5 MCG/ACT inhaler Inhale 2 puffs into the lungs 2 (two) times daily.   Yes Historical Provider, MD  citalopram (CELEXA) 10 MG tablet Take 10 mg by mouth daily.  03/07/11  Yes Historical Provider, MD  dexamethasone (DECADRON) 4 MG tablet Take 1 tablet (4 mg total) by mouth 2 (two) times daily. 06/18/13  Yes Gabriel Filler, MD  docusate sodium (COLACE) 100 MG capsule Take 100 mg by mouth at bedtime.    Yes Historical Provider, MD  furosemide (LASIX) 20 MG tablet Take 20 mg by mouth as needed for fluid.   Yes Historical Provider, MD  meclizine (ANTIVERT) 25 MG tablet Take 25 mg by mouth daily.    Yes Historical Provider, MD  metoprolol succinate (TOPROL-XL) 25 MG 24 hr tablet Take 0.5 tablets (12.5 mg total) by mouth daily. 06/18/13  Yes Gabriel Filler, MD  Multiple Vitamin (MULTIVITAMIN WITH MINERALS) TABS Take 1 tablet by mouth every evening.   Yes Historical Provider, MD  promethazine (PHENERGAN) 12.5 MG tablet Take 12.5 mg by mouth at bedtime.   Yes Historical Provider, MD  temozolomide (TEMODAR) 140 MG capsule Take 140 mg by mouth daily. May take on an empty stomach or at bedtime to decrease nausea & vomiting.   Yes Historical Provider, MD  nitroGLYCERIN (NITROSTAT) 0.4 MG SL tablet Place 0.4 mg under the tongue every 5 (five) minutes as needed for chest pain.    Historical Provider, MD   Physical Exam: Filed Vitals:   07/22/13 1645  BP: 119/49  Pulse: 72  Temp:   Resp: 19    BP 119/49  Pulse 72  Temp(Src) 97.8 F (36.6 C) (Rectal)  Resp 19  SpO2 100%  General:  Appears calm and comfortable, elderly- chronically ill Eyes: PERRL, normal lids, irises & conjunctiva ENT: grossly normal hearing, lips & tongue Neck: no LAD, masses or thyromegaly Cardiovascular: irregular Respiratory: CTA bilaterally, no w/r/r. Normal respiratory effort. Abdomen: soft, ntnd Skin: +LE edema Musculoskeletal: grossly normal tone BUE/BLE Psychiatric: grossly normal mood and affect, speech fluent and appropriate Neurologic: grossly non-focal.          Labs on Admission:  Basic Metabolic Panel:  Recent Labs Lab 07/22/13 1045  NA 138  K 3.9  CL 96  CO2 27  GLUCOSE 94  BUN 18  CREATININE 1.00  CALCIUM 9.5   Liver Function Tests:  Recent Labs Lab 07/22/13 1045  AST 24  ALT 38  ALKPHOS 76  BILITOT 0.5  PROT 5.8*  ALBUMIN 3.0*   No results found for this basename: LIPASE,  AMYLASE,  in the last 168 hours No results found for this basename: AMMONIA,  in the last 168 hours CBC:  Recent Labs Lab 07/22/13 1200  WBC 6.7  NEUTROABS 5.8  HGB 11.4*  HCT 35.7*  MCV 81.7  PLT 126*   Cardiac Enzymes:  Recent Labs Lab 07/22/13 1045  TROPONINI <0.30  BNP (last 3 results)  Recent Labs  05/04/13 1930 07/22/13 1045  PROBNP 604.2* 758.8*   CBG: No results found for this basename: GLUCAP,  in the last 168 hours  Radiological Exams on Admission: Ct Angio Chest Pe W/cm &/or Wo Cm  07/22/2013   CLINICAL DATA:  Short of breath and weakness. Concern for pulmonary embolism or aortic dissection. Prior history of melanoma.  EXAM: CT ANGIOGRAPHY CHEST, ABDOMEN AND PELVIS  TECHNIQUE: Multidetector CT imaging through the chest, abdomen and pelvis was performed using the standard protocol during bolus administration of intravenous contrast. Multiplanar reconstructed images and MIPs were obtained and reviewed to evaluate the vascular anatomy.  CONTRAST:  151mL OMNIPAQUE IOHEXOL 350 MG/ML SOLN  COMPARISON:  CT ABD/PELVIS W CM dated 06/16/2013  FINDINGS: CTA CHEST:  No filling defects within the pulmonary arteries to suggest pulmonary embolism. The right lower lobe is severely atelectatic suggesting prior surgery.  There is no evidence of dissection of the aorta thoracic aorta. Great vessels are normal. There is extensive intimal calcification of the aorta. Consider coronary calcification. There is a coronary bypass graft anatomy.  Cardiac fluid. Esophagus is normal. No mediastinal lymphadenopathy. No axillary or supraclavicular adenopathy.  Scattered ground-glass opacities in the lungs and paraseptal emphysema. Again there is volume loss of the right lower lobe. There is the thick-walled cavitary lesion in the pleural space on the right base which is unchanged prior. Stable back to 2012.  Review of the MIP images confirms the above findings.  CTA ABDOMEN AND PELVIS FINDINGS  No  evidence of dissection or aneurysm abdominal aorta or iliac arteries. Heavy intimal calcification is present. SMA and celiac trunk is are patent and heavily calcified. Renal arteries are patent.  Low-density lesion in the right hepatic lobe is unchanged. Post cholecystectomy. The pancreas, spleen, left adrenal gland are normal. There is nodular enlargement of the right adrenal gland again demonstrated measuring 33 x 25 mm compared to 30 x 22 mm on prior.  Stomach, small bowel, and colon are unremarkable.  No retroperitoneal lymphadenopathy. There is a lobular mass extending from the lower pole of the left kidney which likely represents a retroperitoneal metastasis measuring 4.2 x 3.3 cm slightly incerased from 4.0 x 2.8 cm on most recent exam. Small retroperitoneal implant on the right measures 7 mm unchanged.  The prostate gland and bladder are unchanged. Prostate hypertrophy noted. Bilateral inguinal hernias. No aggressive osseous lesion.  Review of the MIP images confirms the above findings.  IMPRESSION: 1. No evidence of pulmonary embolism. 2. No evidence of aortic dissection. 3. Stable chronic changes within the lungs including volume loss, atelectasis ground-glass opacities. 4. Interval increase size of retroperitoneal metastasis inferior to the left kidney as well as mild increase in right adrenal metastasis. These findings are compared to 06/16/2013.   Electronically Signed   By: Suzy Bouchard M.D.   On: 07/22/2013 15:32   Dg Chest Portable 1 View  07/22/2013   CLINICAL DATA:  Shortness of breath, cough, congestion  EXAM: PORTABLE CHEST - 1 VIEW  COMPARISON:  CT chest dated 06/16/2013  FINDINGS: Volume loss in the right hemithorax with associated right basilar atelectasis/scarring. Left lung is essentially clear. No pleural effusion or pneumothorax.  The heart is normal size. Postsurgical changes related to prior CABG.  Surgical clips in the right supraclavicular region.  Left shoulder arthroplasty.   Cholecystectomy clips.  IMPRESSION: Volume loss in the right hemithorax with associated right basilar atelectasis/scarring.  No evidence of acute cardiopulmonary disease.  Electronically Signed   By: Julian Hy M.D.   On: 07/22/2013 11:15   Ct Angio Abd/pel W/ And/or W/o  07/22/2013   CLINICAL DATA:  Short of breath and weakness. Concern for pulmonary embolism or aortic dissection. Prior history of melanoma.  EXAM: CT ANGIOGRAPHY CHEST, ABDOMEN AND PELVIS  TECHNIQUE: Multidetector CT imaging through the chest, abdomen and pelvis was performed using the standard protocol during bolus administration of intravenous contrast. Multiplanar reconstructed images and MIPs were obtained and reviewed to evaluate the vascular anatomy.  CONTRAST:  142mL OMNIPAQUE IOHEXOL 350 MG/ML SOLN  COMPARISON:  CT ABD/PELVIS W CM dated 06/16/2013  FINDINGS: CTA CHEST:  No filling defects within the pulmonary arteries to suggest pulmonary embolism. The right lower lobe is severely atelectatic suggesting prior surgery.  There is no evidence of dissection of the aorta thoracic aorta. Great vessels are normal. There is extensive intimal calcification of the aorta. Consider coronary calcification. There is a coronary bypass graft anatomy.  Cardiac fluid. Esophagus is normal. No mediastinal lymphadenopathy. No axillary or supraclavicular adenopathy.  Scattered ground-glass opacities in the lungs and paraseptal emphysema. Again there is volume loss of the right lower lobe. There is the thick-walled cavitary lesion in the pleural space on the right base which is unchanged prior. Stable back to 2012.  Review of the MIP images confirms the above findings.  CTA ABDOMEN AND PELVIS FINDINGS  No evidence of dissection or aneurysm abdominal aorta or iliac arteries. Heavy intimal calcification is present. SMA and celiac trunk is are patent and heavily calcified. Renal arteries are patent.  Low-density lesion in the right hepatic lobe is  unchanged. Post cholecystectomy. The pancreas, spleen, left adrenal gland are normal. There is nodular enlargement of the right adrenal gland again demonstrated measuring 33 x 25 mm compared to 30 x 22 mm on prior.  Stomach, small bowel, and colon are unremarkable.  No retroperitoneal lymphadenopathy. There is a lobular mass extending from the lower pole of the left kidney which likely represents a retroperitoneal metastasis measuring 4.2 x 3.3 cm slightly incerased from 4.0 x 2.8 cm on most recent exam. Small retroperitoneal implant on the right measures 7 mm unchanged.  The prostate gland and bladder are unchanged. Prostate hypertrophy noted. Bilateral inguinal hernias. No aggressive osseous lesion.  Review of the MIP images confirms the above findings.  IMPRESSION: 1. No evidence of pulmonary embolism. 2. No evidence of aortic dissection. 3. Stable chronic changes within the lungs including volume loss, atelectasis ground-glass opacities. 4. Interval increase size of retroperitoneal metastasis inferior to the left kidney as well as mild increase in right adrenal metastasis. These findings are compared to 06/16/2013.   Electronically Signed   By: Suzy Bouchard M.D.   On: 07/22/2013 15:32    EKG: Independently reviewed. Atrial fib  Assessment/Plan Active Problems:   Atrial fibrillation   Brain mass   Metastatic melanoma   Metastatic cancer   Fatigue   Dehydration   Fatigue/dehydration- LE swelling improved with hydration- will continue for 12 hours and d/c, check TSH -suspect related to chemo -PT eval  Metastatic melanoma- being treated at [facility] here showed increased size- daughter to contact [facility]  Anemia- may be hemo-concentrated- check CBC in AM  Atrial fib- rate controlled  Hypothermia- monitor, TSH  PT eval    Code Status: DNR Family Communication: family at bedside Disposition Plan: obs  Time spent: 75 min  [facility] Pager [phone]

## 2013-07-22 NOTE — ED Notes (Signed)
Pt with hx of melanoma presents to ED with c/o SOB and weakness starting yesterday.

## 2013-07-23 LAB — CBC
HCT: 37.3 % — ABNORMAL LOW (ref 39.0–52.0)
HEMOGLOBIN: 12 g/dL — AB (ref 13.0–17.0)
MCH: 26.4 pg (ref 26.0–34.0)
MCHC: 32.2 g/dL (ref 30.0–36.0)
MCV: 82.2 fL (ref 78.0–100.0)
Platelets: 127 10*3/uL — ABNORMAL LOW (ref 150–400)
RBC: 4.54 MIL/uL (ref 4.22–5.81)
RDW: 19.7 % — AB (ref 11.5–15.5)
WBC: 6.9 10*3/uL (ref 4.0–10.5)

## 2013-07-23 LAB — BASIC METABOLIC PANEL
BUN: 15 mg/dL (ref 6–23)
CHLORIDE: 104 meq/L (ref 96–112)
CO2: 25 meq/L (ref 19–32)
CREATININE: 0.82 mg/dL (ref 0.50–1.35)
Calcium: 8.9 mg/dL (ref 8.4–10.5)
GFR calc Af Amer: 90 mL/min — ABNORMAL LOW (ref >90)
GFR calc non Af Amer: 77 mL/min — ABNORMAL LOW (ref >90)
Glucose, Bld: 104 mg/dL — ABNORMAL HIGH (ref 70–99)
POTASSIUM: 4.1 meq/L (ref 3.7–5.3)
Sodium: 140 mEq/L (ref 137–147)

## 2013-07-23 LAB — TROPONIN I: Troponin I: <0.3 ng/mL (ref <0.30)

## 2013-07-23 LAB — URINE CULTURE
Colony Count: NO GROWTH
Culture: NO GROWTH
SPECIAL REQUESTS: NORMAL

## 2013-07-23 NOTE — Progress Notes (Signed)
Discharge home. Home discharge instruction given, no questions verbalized. 

## 2013-07-23 NOTE — Progress Notes (Signed)
Nutrition Brief Note  Patient identified on the Malnutrition Screening Tool (MST) Report  Wt Readings from Last 15 Encounters:  07/12/13 166 lb 12.8 oz (75.66 kg)  06/30/13 176 lb 14.4 oz (80.241 kg)  06/25/13 170 lb 12.8 oz (77.474 kg)  06/13/13 149 lb 0.5 oz (67.6 kg)  05/04/13 176 lb (79.833 kg)  04/19/13 176 lb (79.833 kg)  08/05/12 181 lb 6.4 oz (82.283 kg)  07/30/12 182 lb (82.555 kg)  03/16/12 194 lb 8 oz (88.225 kg)  03/11/11 196 lb (88.905 kg)  08/01/08 211 lb (95.709 kg)  01/20/08 211 lb 2.1 oz (95.769 kg)  12/15/07 213 lb (96.616 kg)    There is no current weight on file to calculate BMI. Based on weight from 4/05 pt's BMI is 24.5 kg/(m^2) and patient meets criteria for Normal Weight.   Current diet order is 2 Gram Sodium, patient is consuming approximately 100% of meals at this time. Pt reports weight loss over the past couple years due to cancer treatment. He reports he know maintains his weight around 164 lbs, he has a good appetite, was eating well PTA, and drinks Ensure Plus once daily. Pt states he feels much better today and is being discharged today. Encouraged adequate PO intake and continued use of nutritional supplements. Labs and medications reviewed.   No nutrition interventions warranted at this time. If nutrition issues arise, please consult RD.   Pryor Ochoa RD, LDN Inpatient Clinical Dietitian Pager: [phone] After Hours Pager: [phone]

## 2013-07-23 NOTE — Discharge Summary (Signed)
Physician Discharge Summary  Gabriel Wood [id number] DOB: 02/20/1924 DOA: 07/22/2013  PCP: Simona Huh, MD  Admit date: 07/22/2013 Discharge date: 07/23/2013  Time spent: 35 minutes  Recommendations for Outpatient Follow-up:  1. Follow up at [facility] 2. Repeat TSH, free t4  Discharge Diagnoses:  Active Problems:   Atrial fibrillation   Brain mass   Metastatic melanoma   Metastatic cancer   Fatigue   Dehydration   Discharge Condition: improved  Diet recommendation: cardiac  There were no vitals filed for this visit.  History of present illness:  Gabriel Wood is a 80 y.o. male  Who came to the ER with SOB and fatigue. He has not been feeling well since he began taking chemo about 6 days ago. He has metastatic melanoma and follows for chemo at [facility]). He was more fatigued this AM and SOB. He has O2 at home and has been having to wear it continuously. He had an episode of nausea this AM as well but not vomiting.  Family reports that he has had LE edema but this has actually improved in the ER after fluid administration. No fever, no chills  Walks with a walker at baseline. Eating ok per family. 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 Sick "stomach" flu contact about 1- days ago- no diarrhea  Work up in ER showed hypothermia and low BP but labs otherwise unremarkable   Hospital Course:  Dehydration and fatigue- IV fluids given and patient improved- given CT Scan for [facility] follow up as read as increased mets to adrenal gland   Discharge Exam: Filed Vitals:   07/23/13 0643  BP: 126/53  Pulse: 95  Temp: 97.8 F (36.6 C)  Resp: 18    General: anxious to go home Cardiovascular: rrr Respiratory: clear  Discharge Instructions You were cared for by a hospitalist during your hospital stay. If you have any questions about your discharge medications or the care you received while you were in the hospital  after you are discharged, you can call the unit and asked to speak with the hospitalist on call if the hospitalist that took care of you is not available. Once you are discharged, your primary care physician will handle any further medical issues. Please note that NO REFILLS for any discharge medications will be authorized once you are discharged, as it is imperative that you return to your primary care physician (or establish a relationship with a primary care physician if you do not have one) for your aftercare needs so that they can reassess your need for medications and monitor your lab values.  Discharge Orders   Future Appointments Provider Department Dept Phone   09/28/2013 10:00 AM [facility] [place] [phone]   Please arrive 15 minutes prior to your appointment time.   04/25/2014 9:00 AM [facility] [place] [facility] [address] [phone]   Eat a light meal the night before the exam Nothing to eat or drink for at least 8 hours before exam No gum chewing, or smoking the morning of the exam. Please take your morning medications with small sips of water, especially blood pressure medication *Very Important* Please wear 2 piece clothing   04/25/2014 9:30 AM [facility] [place] [facility] [name] [id number]   04/25/2014 10:40 AM Sharmon Leyden Nickel, NP [facility] [phone]   Future Orders Complete By Expires   (  HEART FAILURE PATIENTS) Call MD:  Anytime you have any of the following symptoms: 1) 3 pound weight gain in 24 hours or 5 pounds in 1 week 2) shortness of breath, with or without a dry hacking cough 3) swelling in the hands, feet or stomach 4) if you have to sleep on extra pillows at night in order to breathe.  As directed    Diet - low sodium heart healthy  As directed    Discharge instructions  As directed    Increase activity slowly  As directed        Medication List    STOP taking these  medications       furosemide 20 MG tablet  Commonly known as:  LASIX      TAKE these medications       acetaminophen 650 MG CR tablet  Commonly known as:  TYLENOL  Take 1,300 mg by mouth every 8 (eight) hours as needed for pain.     albuterol 108 (90 BASE) MCG/ACT inhaler  Commonly known as:  PROVENTIL HFA;VENTOLIN HFA  Inhale 2 puffs into the lungs every 6 (six) hours as needed for wheezing or shortness of breath.     aspirin EC 81 MG tablet  Take 81 mg by mouth daily.     atorvastatin 40 MG tablet  Commonly known as:  LIPITOR  Take 40 mg by mouth daily.     budesonide-formoterol 160-4.5 MCG/ACT inhaler  Commonly known as:  SYMBICORT  Inhale 2 puffs into the lungs 2 (two) times daily.     citalopram 10 MG tablet  Commonly known as:  CELEXA  Take 10 mg by mouth daily.     dexamethasone 4 MG tablet  Commonly known as:  DECADRON  Take 1 tablet (4 mg total) by mouth 2 (two) times daily.     docusate sodium 100 MG capsule  Commonly known as:  COLACE  Take 100 mg by mouth at bedtime.     meclizine 25 MG tablet  Commonly known as:  ANTIVERT  Take 25 mg by mouth daily.     metoprolol succinate 25 MG 24 hr tablet  Commonly known as:  TOPROL-XL  Take 0.5 tablets (12.5 mg total) by mouth daily.     multivitamin with minerals Tabs tablet  Take 1 tablet by mouth every evening.     nitroGLYCERIN 0.4 MG SL tablet  Commonly known as:  NITROSTAT  Place 0.4 mg under the tongue every 5 (five) minutes as needed for chest pain.     promethazine 12.5 MG tablet  Commonly known as:  PHENERGAN  Take 12.5 mg by mouth at bedtime.     temozolomide 140 MG capsule  Commonly known as:  TEMODAR  Take 140 mg by mouth daily. May take on an empty stomach or at bedtime to decrease nausea & vomiting.       Allergies  Allergen Reactions  . Morphine And Related Palpitations  . Codeine Nausea Only      The results of significant diagnostics from this hospitalization (including  imaging, microbiology, ancillary and laboratory) are listed below for reference.    Significant Diagnostic Studies: Ct Angio Chest Pe W/cm &/or Wo Cm  07/22/2013   CLINICAL DATA:  Short of breath and weakness. Concern for pulmonary embolism or aortic dissection. Prior history of melanoma.  EXAM: CT ANGIOGRAPHY CHEST, ABDOMEN AND PELVIS  TECHNIQUE: Multidetector CT imaging through the chest, abdomen and pelvis was performed using the standard protocol during bolus administration  of intravenous contrast. Multiplanar reconstructed images and MIPs were obtained and reviewed to evaluate the vascular anatomy.  CONTRAST:  161mL OMNIPAQUE IOHEXOL 350 MG/ML SOLN  COMPARISON:  CT ABD/PELVIS W CM dated 06/16/2013  FINDINGS: CTA CHEST:  No filling defects within the pulmonary arteries to suggest pulmonary embolism. The right lower lobe is severely atelectatic suggesting prior surgery.  There is no evidence of dissection of the aorta thoracic aorta. Great vessels are normal. There is extensive intimal calcification of the aorta. Consider coronary calcification. There is a coronary bypass graft anatomy.  Cardiac fluid. Esophagus is normal. No mediastinal lymphadenopathy. No axillary or supraclavicular adenopathy.  Scattered ground-glass opacities in the lungs and paraseptal emphysema. Again there is volume loss of the right lower lobe. There is the thick-walled cavitary lesion in the pleural space on the right base which is unchanged prior. Stable back to 2012.  Review of the MIP images confirms the above findings.  CTA ABDOMEN AND PELVIS FINDINGS  No evidence of dissection or aneurysm abdominal aorta or iliac arteries. Heavy intimal calcification is present. SMA and celiac trunk is are patent and heavily calcified. Renal arteries are patent.  Low-density lesion in the right hepatic lobe is unchanged. Post cholecystectomy. The pancreas, spleen, left adrenal gland are normal. There is nodular enlargement of the right adrenal  gland again demonstrated measuring 33 x 25 mm compared to 30 x 22 mm on prior.  Stomach, small bowel, and colon are unremarkable.  No retroperitoneal lymphadenopathy. There is a lobular mass extending from the lower pole of the left kidney which likely represents a retroperitoneal metastasis measuring 4.2 x 3.3 cm slightly incerased from 4.0 x 2.8 cm on most recent exam. Small retroperitoneal implant on the right measures 7 mm unchanged.  The prostate gland and bladder are unchanged. Prostate hypertrophy noted. Bilateral inguinal hernias. No aggressive osseous lesion.  Review of the MIP images confirms the above findings.  IMPRESSION: 1. No evidence of pulmonary embolism. 2. No evidence of aortic dissection. 3. Stable chronic changes within the lungs including volume loss, atelectasis ground-glass opacities. 4. Interval increase size of retroperitoneal metastasis inferior to the left kidney as well as mild increase in right adrenal metastasis. These findings are compared to 06/16/2013.   Electronically Signed   By: Suzy Bouchard M.D.   On: 07/22/2013 15:32   Dg Chest Portable 1 View  07/22/2013   CLINICAL DATA:  Shortness of breath, cough, congestion  EXAM: PORTABLE CHEST - 1 VIEW  COMPARISON:  CT chest dated 06/16/2013  FINDINGS: Volume loss in the right hemithorax with associated right basilar atelectasis/scarring. Left lung is essentially clear. No pleural effusion or pneumothorax.  The heart is normal size. Postsurgical changes related to prior CABG.  Surgical clips in the right supraclavicular region.  Left shoulder arthroplasty.  Cholecystectomy clips.  IMPRESSION: Volume loss in the right hemithorax with associated right basilar atelectasis/scarring.  No evidence of acute cardiopulmonary disease.   Electronically Signed   By: Julian Hy M.D.   On: 07/22/2013 11:15   Ct Angio Abd/pel W/ And/or W/o  07/22/2013   CLINICAL DATA:  Short of breath and weakness. Concern for pulmonary embolism or  aortic dissection. Prior history of melanoma.  EXAM: CT ANGIOGRAPHY CHEST, ABDOMEN AND PELVIS  TECHNIQUE: Multidetector CT imaging through the chest, abdomen and pelvis was performed using the standard protocol during bolus administration of intravenous contrast. Multiplanar reconstructed images and MIPs were obtained and reviewed to evaluate the vascular anatomy.  CONTRAST:  135mL OMNIPAQUE  IOHEXOL 350 MG/ML SOLN  COMPARISON:  CT ABD/PELVIS W CM dated 06/16/2013  FINDINGS: CTA CHEST:  No filling defects within the pulmonary arteries to suggest pulmonary embolism. The right lower lobe is severely atelectatic suggesting prior surgery.  There is no evidence of dissection of the aorta thoracic aorta. Great vessels are normal. There is extensive intimal calcification of the aorta. Consider coronary calcification. There is a coronary bypass graft anatomy.  Cardiac fluid. Esophagus is normal. No mediastinal lymphadenopathy. No axillary or supraclavicular adenopathy.  Scattered ground-glass opacities in the lungs and paraseptal emphysema. Again there is volume loss of the right lower lobe. There is the thick-walled cavitary lesion in the pleural space on the right base which is unchanged prior. Stable back to 2012.  Review of the MIP images confirms the above findings.  CTA ABDOMEN AND PELVIS FINDINGS  No evidence of dissection or aneurysm abdominal aorta or iliac arteries. Heavy intimal calcification is present. SMA and celiac trunk is are patent and heavily calcified. Renal arteries are patent.  Low-density lesion in the right hepatic lobe is unchanged. Post cholecystectomy. The pancreas, spleen, left adrenal gland are normal. There is nodular enlargement of the right adrenal gland again demonstrated measuring 33 x 25 mm compared to 30 x 22 mm on prior.  Stomach, small bowel, and colon are unremarkable.  No retroperitoneal lymphadenopathy. There is a lobular mass extending from the lower pole of the left kidney which  likely represents a retroperitoneal metastasis measuring 4.2 x 3.3 cm slightly incerased from 4.0 x 2.8 cm on most recent exam. Small retroperitoneal implant on the right measures 7 mm unchanged.  The prostate gland and bladder are unchanged. Prostate hypertrophy noted. Bilateral inguinal hernias. No aggressive osseous lesion.  Review of the MIP images confirms the above findings.  IMPRESSION: 1. No evidence of pulmonary embolism. 2. No evidence of aortic dissection. 3. Stable chronic changes within the lungs including volume loss, atelectasis ground-glass opacities. 4. Interval increase size of retroperitoneal metastasis inferior to the left kidney as well as mild increase in right adrenal metastasis. These findings are compared to 06/16/2013.   Electronically Signed   By: Suzy Bouchard M.D.   On: 07/22/2013 15:32    Microbiology: Recent Results (from the past 240 hour(s))  CULTURE, BLOOD (ROUTINE X 2)     Status: None   Collection Time    07/22/13 11:45 AM      Result Value Ref Range Status   Specimen Description BLOOD RIGHT HAND   Final   Special Requests BOTTLES DRAWN AEROBIC AND ANAEROBIC 10CC   Final   Culture  Setup Time     Final   Value: 07/22/2013 14:41     Performed at [facility]   Culture     Final   Value:        BLOOD CULTURE RECEIVED NO GROWTH TO DATE CULTURE WILL BE HELD FOR 5 DAYS BEFORE ISSUING A FINAL NEGATIVE REPORT     Performed at [facility]   Report Status PENDING   Incomplete  CULTURE, BLOOD (ROUTINE X 2)     Status: None   Collection Time    07/22/13 12:00 PM      Result Value Ref Range Status   Specimen Description BLOOD LEFT ANTECUBITAL   Final   Special Requests BOTTLES DRAWN AEROBIC AND ANAEROBIC 10CC   Final   Culture  Setup Time     Final   Value: 07/22/2013 14:41     Performed at  [facility]     Final   Value:        BLOOD CULTURE RECEIVED NO GROWTH TO DATE CULTURE WILL BE HELD FOR 5 DAYS BEFORE ISSUING A FINAL  NEGATIVE REPORT     Performed at [facility]   Report Status PENDING   Incomplete     Labs: Basic Metabolic Panel:  Recent Labs Lab 07/22/13 1045 07/23/13 0102  NA 138 140  K 3.9 4.1  CL 96 104  CO2 27 25  GLUCOSE 94 104*  BUN 18 15  CREATININE 1.00 0.82  CALCIUM 9.5 8.9   Liver Function Tests:  Recent Labs Lab 07/22/13 1045  AST 24  ALT 38  ALKPHOS 76  BILITOT 0.5  PROT 5.8*  ALBUMIN 3.0*   No results found for this basename: LIPASE, AMYLASE,  in the last 168 hours No results found for this basename: AMMONIA,  in the last 168 hours CBC:  Recent Labs Lab 07/22/13 1200 07/23/13 0102  WBC 6.7 6.9  NEUTROABS 5.8  --   HGB 11.4* 12.0*  HCT 35.7* 37.3*  MCV 81.7 82.2  PLT 126* 127*   Cardiac Enzymes:  Recent Labs Lab 07/22/13 1045 07/22/13 1920 07/23/13 0102 07/23/13 0752  TROPONINI <0.30 <0.30 <0.30 <0.30   BNP: BNP (last 3 results)  Recent Labs  05/04/13 1930 07/22/13 1045  PROBNP 604.2* 758.8*   CBG: No results found for this basename: GLUCAP,  in the last 168 hours     Signed:  Geradine Girt  [facility] 07/23/2013, 10:45 AM

## 2013-07-28 LAB — CULTURE, BLOOD (ROUTINE X 2)
Culture: NO GROWTH
Culture: NO GROWTH

## 2013-07-31 ENCOUNTER — Encounter: Payer: Self-pay | Admitting: Radiation Oncology

## 2013-08-04 ENCOUNTER — Ambulatory Visit: Payer: Medicare Other | Admitting: Radiation Oncology

## 2013-08-05 ENCOUNTER — Ambulatory Visit: Payer: Self-pay | Admitting: Radiation Oncology

## 2013-09-13 ENCOUNTER — Other Ambulatory Visit: Payer: Self-pay | Admitting: Radiation Therapy

## 2013-09-13 DIAGNOSIS — C7931 Secondary malignant neoplasm of brain: Secondary | ICD-10-CM

## 2013-09-13 DIAGNOSIS — C7949 Secondary malignant neoplasm of other parts of nervous system: Principal | ICD-10-CM

## 2013-09-17 ENCOUNTER — Ambulatory Visit: Payer: Medicare Other

## 2013-09-28 ENCOUNTER — Ambulatory Visit
Admission: RE | Admit: 2013-09-28 | Discharge: 2013-09-28 | Disposition: A | Discharge status: Home / Self Care | Payer: Medicare Other | Source: Ambulatory Visit | Attending: Radiation Oncology | Admitting: Radiation Oncology

## 2013-09-28 DIAGNOSIS — C7931 Secondary malignant neoplasm of brain: Secondary | ICD-10-CM

## 2013-09-28 DIAGNOSIS — C7949 Secondary malignant neoplasm of other parts of nervous system: Principal | ICD-10-CM

## 2013-09-28 MED ORDER — GADOBENATE DIMEGLUMINE 529 MG/ML IV SOLN: 15.0000 mL | Freq: Once | INTRAVENOUS | Status: AC | PRN

## 2013-09-29 ENCOUNTER — Other Ambulatory Visit: Payer: Self-pay | Admitting: Radiation Therapy

## 2013-09-29 NOTE — Progress Notes (Signed)
Pt is scheduled to see MD at [facility] to review the most recent MRI on 6/25, so the follow-up with Dr. Sherwood Gambler scheduled on 6/26 was cancelled per the patient's request. Dr. Sherwood Gambler is aware of the cancellation and will make a note to document that at this office.  Based on the Brain/Conference review and discussion of the 6/23 MRI, there was a great response from the [facility] treatment. The tumor and edema have decreased considerably.  A repeat MRI will be done in 3 months and the patient will see Dr. Lisbeth Renshaw to review the scan at that time.  In the mean time, the patient will continue to be seen for systemic treatment at [facility], receiving OPDIVO every 2 weeks, which he is tolerating and responding well.    Mont Dutton R.T. (R) (T) [facility] [place] [phone] Office [phone] Pager [phone] Fax [email or web address]

## 2013-10-19 ENCOUNTER — Telehealth: Payer: Self-pay | Admitting: Internal Medicine

## 2013-10-19 ENCOUNTER — Telehealth: Payer: Self-pay | Admitting: *Deleted

## 2013-10-19 ENCOUNTER — Other Ambulatory Visit: Payer: Self-pay | Admitting: Medical Oncology

## 2013-10-19 DIAGNOSIS — C439 Malignant melanoma of skin, unspecified: Secondary | ICD-10-CM

## 2013-10-19 DIAGNOSIS — C799 Secondary malignant neoplasm of unspecified site: Secondary | ICD-10-CM

## 2013-10-19 NOTE — Telephone Encounter (Signed)
S/W PATIENT DTR-IN-LAW SANDRA AND GAVE NP APPT FOR 07/20 @ 1:30 W/DR. MOHAMED.

## 2013-10-19 NOTE — Telephone Encounter (Signed)
Per staff message I have scheduled appt. Per desk RN ok to schedule next day

## 2013-10-20 ENCOUNTER — Telehealth: Payer: Self-pay | Admitting: Medical Oncology

## 2013-10-20 NOTE — Telephone Encounter (Signed)
Daughter called to cancel infusion on July 21 since pt going to [facility] for scan. She requested infusion on same day as new pt appt. Done.

## 2013-10-21 ENCOUNTER — Telehealth: Payer: Self-pay | Admitting: Internal Medicine

## 2013-10-21 NOTE — Telephone Encounter (Signed)
inf appt already added to new pt appt as requested per 7/14 pof.

## 2013-10-25 ENCOUNTER — Other Ambulatory Visit: Payer: Medicare Other

## 2013-10-25 ENCOUNTER — Ambulatory Visit: Payer: Medicare Other | Admitting: Internal Medicine

## 2013-10-25 ENCOUNTER — Other Ambulatory Visit: Payer: Self-pay | Admitting: Radiation Therapy

## 2013-10-25 ENCOUNTER — Ambulatory Visit: Payer: Medicare Other

## 2013-10-25 DIAGNOSIS — C7931 Secondary malignant neoplasm of brain: Secondary | ICD-10-CM

## 2013-10-25 DIAGNOSIS — C7949 Secondary malignant neoplasm of other parts of nervous system: Principal | ICD-10-CM

## 2013-10-26 ENCOUNTER — Ambulatory Visit: Payer: Medicare Other

## 2013-11-15 ENCOUNTER — Other Ambulatory Visit: Payer: Self-pay | Admitting: Medical Oncology

## 2013-11-15 DIAGNOSIS — C799 Secondary malignant neoplasm of unspecified site: Secondary | ICD-10-CM

## 2013-11-15 DIAGNOSIS — C439 Malignant melanoma of skin, unspecified: Secondary | ICD-10-CM

## 2013-11-18 ENCOUNTER — Other Ambulatory Visit (HOSPITAL_BASED_OUTPATIENT_CLINIC_OR_DEPARTMENT_OTHER): Payer: Medicare Other

## 2013-11-18 ENCOUNTER — Ambulatory Visit (HOSPITAL_BASED_OUTPATIENT_CLINIC_OR_DEPARTMENT_OTHER): Payer: Medicare Other

## 2013-11-18 ENCOUNTER — Encounter: Payer: Self-pay | Admitting: Internal Medicine

## 2013-11-18 ENCOUNTER — Ambulatory Visit (HOSPITAL_BASED_OUTPATIENT_CLINIC_OR_DEPARTMENT_OTHER): Payer: Medicare Other | Admitting: Internal Medicine

## 2013-11-18 ENCOUNTER — Telehealth: Payer: Self-pay | Admitting: *Deleted

## 2013-11-18 ENCOUNTER — Telehealth: Payer: Self-pay | Admitting: Internal Medicine

## 2013-11-18 ENCOUNTER — Other Ambulatory Visit: Payer: Self-pay | Admitting: Internal Medicine

## 2013-11-18 ENCOUNTER — Ambulatory Visit: Payer: Medicare Other

## 2013-11-18 VITALS — BP 109/54 | HR 79 | Temp 97.5°F | Resp 18 | Ht 63.0 in | Wt 168.1 lb

## 2013-11-18 DIAGNOSIS — C439 Malignant melanoma of skin, unspecified: Secondary | ICD-10-CM

## 2013-11-18 DIAGNOSIS — C799 Secondary malignant neoplasm of unspecified site: Secondary | ICD-10-CM

## 2013-11-18 DIAGNOSIS — C7931 Secondary malignant neoplasm of brain: Secondary | ICD-10-CM

## 2013-11-18 DIAGNOSIS — C7952 Secondary malignant neoplasm of bone marrow: Secondary | ICD-10-CM

## 2013-11-18 DIAGNOSIS — E348 Other specified endocrine disorders: Secondary | ICD-10-CM

## 2013-11-18 DIAGNOSIS — Z5112 Encounter for antineoplastic immunotherapy: Secondary | ICD-10-CM

## 2013-11-18 DIAGNOSIS — C7949 Secondary malignant neoplasm of other parts of nervous system: Secondary | ICD-10-CM

## 2013-11-18 DIAGNOSIS — C50919 Malignant neoplasm of unspecified site of unspecified female breast: Secondary | ICD-10-CM

## 2013-11-18 DIAGNOSIS — C7951 Secondary malignant neoplasm of bone: Secondary | ICD-10-CM

## 2013-11-18 DIAGNOSIS — C7800 Secondary malignant neoplasm of unspecified lung: Secondary | ICD-10-CM

## 2013-11-18 DIAGNOSIS — R0609 Other forms of dyspnea: Secondary | ICD-10-CM

## 2013-11-18 DIAGNOSIS — C779 Secondary and unspecified malignant neoplasm of lymph node, unspecified: Secondary | ICD-10-CM

## 2013-11-18 DIAGNOSIS — R42 Dizziness and giddiness: Secondary | ICD-10-CM

## 2013-11-18 DIAGNOSIS — R0989 Other specified symptoms and signs involving the circulatory and respiratory systems: Secondary | ICD-10-CM

## 2013-11-18 LAB — COMPREHENSIVE METABOLIC PANEL (CC13)
ALK PHOS: 63 U/L (ref 40–150)
ALT: 11 U/L (ref 0–55)
AST: 13 U/L (ref 5–34)
Albumin: 3.6 g/dL (ref 3.5–5.0)
Anion Gap: 8 mEq/L (ref 3–11)
BILIRUBIN TOTAL: 0.52 mg/dL (ref 0.20–1.20)
BUN: 14.7 mg/dL (ref 7.0–26.0)
CO2: 28 mEq/L (ref 22–29)
Calcium: 9.9 mg/dL (ref 8.4–10.4)
Chloride: 104 mEq/L (ref 98–109)
Creatinine: 0.9 mg/dL (ref 0.7–1.3)
GLUCOSE: 97 mg/dL (ref 70–140)
Potassium: 4.9 mEq/L (ref 3.5–5.1)
Sodium: 139 mEq/L (ref 136–145)
Total Protein: 6.3 g/dL — ABNORMAL LOW (ref 6.4–8.3)

## 2013-11-18 LAB — CBC WITH DIFFERENTIAL/PLATELET
BASO%: 0.4 % (ref 0.0–2.0)
Basophils Absolute: 0 10*3/uL (ref 0.0–0.1)
EOS%: 1.5 % (ref 0.0–7.0)
Eosinophils Absolute: 0.1 10*3/uL (ref 0.0–0.5)
HCT: 36.3 % — ABNORMAL LOW (ref 38.4–49.9)
HGB: 11.2 g/dL — ABNORMAL LOW (ref 13.0–17.1)
LYMPH%: 8.5 % — ABNORMAL LOW (ref 14.0–49.0)
MCH: 24.7 pg — ABNORMAL LOW (ref 27.2–33.4)
MCHC: 30.9 g/dL — AB (ref 32.0–36.0)
MCV: 79.7 fL (ref 79.3–98.0)
MONO#: 0.7 10*3/uL (ref 0.1–0.9)
MONO%: 8.6 % (ref 0.0–14.0)
NEUT#: 6.3 10*3/uL (ref 1.5–6.5)
NEUT%: 81 % — ABNORMAL HIGH (ref 39.0–75.0)
Platelets: 174 10*3/uL (ref 140–400)
RBC: 4.56 10*6/uL (ref 4.20–5.82)
RDW: 16.7 % — ABNORMAL HIGH (ref 11.0–14.6)
WBC: 7.8 10*3/uL (ref 4.0–10.3)
lymph#: 0.7 10*3/uL — ABNORMAL LOW (ref 0.9–3.3)

## 2013-11-18 LAB — LACTATE DEHYDROGENASE (CC13): LDH: 114 U/L — AB (ref 125–245)

## 2013-11-18 MED ORDER — SODIUM CHLORIDE 0.9 % IV SOLN
Freq: Once | INTRAVENOUS | Status: AC
  Administered 2013-11-18: 13:00:00 via INTRAVENOUS

## 2013-11-18 MED ORDER — NIVOLUMAB CHEMO INJECTION 100 MG/10ML
3.0000 mg/kg | Freq: Once | INTRAVENOUS | Status: AC
  Administered 2013-11-18: 230 mg via INTRAVENOUS
  Filled 2013-11-18: qty 23

## 2013-11-18 NOTE — Telephone Encounter (Signed)
, °

## 2013-11-18 NOTE — Telephone Encounter (Signed)
Per POF staff phone call scheduled appts. Advised schedulers 

## 2013-11-18 NOTE — Progress Notes (Signed)
Checked in new patient with no financial issues prior to seeing the dr. He has appt card and [place] been [place] the [place]. He ok'd for his son to sign-roi form for him.

## 2013-11-18 NOTE — Patient Instructions (Addendum)
[facility] Discharge Instructions for Patients Receiving Chemotherapy  Today you received the following chemotherapy agents: Nivolumab  To help prevent nausea and vomiting after your treatment, we encourage you to take your nausea medication as prescribed.    If you develop nausea and vomiting that is not controlled by your nausea medication, call the clinic.   BELOW ARE SYMPTOMS THAT SHOULD BE REPORTED IMMEDIATELY:  *FEVER GREATER THAN 100.5 F  *CHILLS WITH OR WITHOUT FEVER  NAUSEA AND VOMITING THAT IS NOT CONTROLLED WITH YOUR NAUSEA MEDICATION  *UNUSUAL SHORTNESS OF BREATH  *UNUSUAL BRUISING OR BLEEDING  TENDERNESS IN MOUTH AND THROAT WITH OR WITHOUT PRESENCE OF ULCERS  *URINARY PROBLEMS  *BOWEL PROBLEMS  UNUSUAL RASH Items with * indicate a potential emergency and should be followed up as soon as possible.  Feel free to call the clinic you have any questions or concerns. The clinic phone number is [phone].  Nivolumab injection What is this medicine? NIVOLUMAB (nye VOL ue mab) is used to treat certain types of melanoma and lung cancer. This medicine may be used for other purposes; ask your health care provider or pharmacist if you have questions. COMMON BRAND NAME(S): Opdivo What should I tell my health care provider before I take this medicine? They need to know if you have any of these conditions: -eye disease, vision problems -history of pancreatitis -immune system problems -inflammatory bowel disease -kidney disease -liver disease -lung disease -lupus -myasthenia gravis -multiple sclerosis -organ transplant -stomach or intestine problems -thyroid disease -tingling of the fingers or toes, or other nerve disorder -an unusual or allergic reaction to nivolumab, other medicines, foods, dyes, or preservatives -pregnant or trying to get pregnant -breast-feeding How should I use this medicine? This medicine is for infusion into a vein. It  is given by a health care professional in a hospital or clinic setting. A special MedGuide will be given to you before each treatment. Be sure to read this information carefully each time. Talk to your pediatrician regarding the use of this medicine in children. Special care may be needed. Overdosage: If you think you've taken too much of this medicine contact a poison control center or emergency room at once. Overdosage: If you think you have taken too much of this medicine contact a poison control center or emergency room at once. NOTE: This medicine is only for you. Do not share this medicine with others. What if I miss a dose? It is important not to miss your dose. Call your doctor or health care professional if you are unable to keep an appointment. What may interact with this medicine? Interactions have not been studied. This list may not describe all possible interactions. Give your health care provider a list of all the medicines, herbs, non-prescription drugs, or dietary supplements you use. Also tell them if you smoke, drink alcohol, or use illegal drugs. Some items may interact with your medicine. What should I watch for while using this medicine? Tell your doctor or healthcare professional if your symptoms do not start to get better or if they get worse. Your condition will be monitored carefully while you are receiving this medicine. You may need blood work done while you are taking this medicine. What side effects may I notice from receiving this medicine? Side effects that you should report to your doctor or health care professional as soon as possible: -allergic reactions like skin rash, itching or hives, swelling of the face, lips, or tongue -black, tarry stools -bloody   or watery diarrhea -changes in vision -chills -cough -depressed mood -eye pain -feeling anxious -fever -general ill feeling or flu-like symptoms -hair loss -loss of appetite -low blood counts - this  medicine may decrease the number of white blood cells, red blood cells and platelets. You may be at increased risk for infections and bleeding -pain, tingling, numbness in the hands or feet -redness, blistering, peeling or loosening of the skin, including inside the mouth -red pinpoint spots on skin -signs of decreased platelets or bleeding - bruising, pinpoint red spots on the skin, black, tarry stools, blood in the urine -signs of decreased red blood cells - unusually weak or tired, feeling faint or lightheaded, falls -signs of infection - fever or chills, cough, sore throat, pain or trouble passing urine -signs and symptoms of a dangerous change in heartbeat or heart rhythm like chest pain; dizziness; fast or irregular heartbeat; palpitations; feeling faint or lightheaded, falls; breathing problems -signs and symptoms of high blood sugar such as dizziness; dry mouth; dry skin; fruity breath; nausea; stomach pain; increased hunger or thirst; increased urination -signs and symptoms of kidney injury like trouble passing urine or change in the amount of urine -signs and symptoms of liver injury like dark yellow or brown urine; general ill feeling or flu-like symptoms; light-colored stools; loss of appetite; nausea; right upper belly pain; unusually weak or tired; yellowing of the eyes or skin -signs and symptoms of increased potassium like muscle weakness; chest pain; or fast, irregular heartbeat -signs and symptoms of low potassium like muscle cramps or muscle pain; chest pain; dizziness; feeling faint or lightheaded, falls; palpitations; breathing problems; or fast, irregular heartbeat -swelling of the ankles, feet, hands -weight gainSide effects that usually do not require medical attention (report to your doctor or health care professional if they continue or are bothersome): -constipation -general ill feeling or flu-like symptoms -hair loss -loss of appetite -nausea, vomiting This list may  not describe all possible side effects. Call your doctor for medical advice about side effects. You may report side effects to FDA at 1-800-FDA-1088. Where should I keep my medicine? This drug is given in a hospital or clinic and will not be stored at home. NOTE: This sheet is a summary. It may not cover all possible information. If you have questions about this medicine, talk to your doctor, pharmacist, or health care provider.  2015, Elsevier/Gold Standard. (2013-06-14 13:18:19)   

## 2013-11-19 ENCOUNTER — Telehealth: Payer: Self-pay | Admitting: *Deleted

## 2013-11-19 NOTE — Progress Notes (Signed)
Gabriel Wood [phone]   [phone]  CONSULT NOTE  REFERRING PHYSICIAN: Dr. Tama Gander Moschos  REASON FOR CONSULTATION:  80 years old white male with metastatic melanoma.  HPI Gabriel Wood is a 80 y.o. male with a past medical history significant for multiple medical problems including history of COPD, coronary artery disease status post CABG under the care of Dr. Cyndia Bent, hypertension, abdominal aortic aneurysm, TIA, benign prostatic hypertrophy, irritable bowel syndrome, dyslipidemia, atrial fibrillation as well as history of adrenal insufficiency. The patient mentions that in September of 2013 he was seen by his dermatologist Dr. Nevada Crane who found a suspicious lesion on the right shoulder. It was resected and it showed a 2.0 x 2.0 serum irregular background pigmented macule from the right shoulder. There was also a 1.1 x 0.7 CM hemorrhagic hyperkeratotic papule in the upper right back. The final dermatopathology over the right shoulder lesion was consistent with melanoma, Breslow thickness 2 mm plus, Clark level V with 5 mitoses/MM2, with no radiation or observation and no satellitosis or vascular invasion. The other lesion from the right upper back was consistent with basal cell carcinoma. The patient was seen by Dr. Freddi Che at [facility] and he underwent wide excision with sentinel lymph node mapping on 01/27/2012. The final pathology was negative for residual disease and the one right axillary lymph node was negative for melanoma. The patient was followed closely by observation and in December of 2013 he was noted to have enlarged right neck and right supraclavicular lymph node. A PET scan at that time showed intense hypermetabolic 2.8 x 2.1 CM cyst issue nodule in the posterior right neck at C6 level with another hypermetabolic superficial node measuring 1.6 x 1.3 CM superior to the previous lymph node. The PET scan also showed Prevascular, precarinal  and R parabronchial lymph nodes with moderate-intense FDG-avidity. A 3.8-cm non-FDG avid lesion in the L hepatic lobe. A 0.7-cm RLL nonobstructive renal stone, another 0.5-cm nonobstructing lower pole stone and a 4.1-cm simple cyst in the interpolar region. Enlarged prostate measuring up to 5.9-cm. the patient underwent cervical lymph node dissection under the care of Dr. Freddi Che and the lymph nodes were positive for metastatic melanoma. Molecular studies showed negative BRAF/NRAS/KIT mutation but positive PIK3CA mutation ([id number]).  He was treated with a palliative course of external beam radiation completed 10/06/2012. In general 2015 the patient developed progressive weakness on the right side especially in the right upper extremity. Imaging studies at [facility] including head CT on 06/11/2013 was significant for left cerebellar peduncle hypoattenuation and swelling consistent with subacute infarct as well as hypoattenuation in the pons but MRI of the brain on 06/12/2013 showed 2.1 x 1.9 CM focus of heterogenous T2 signal centered in the left upper pons/llower midbrain along with a small thin rim of increased diffusion. Staging workup at that time including CT scan of the chest, abdomen and pelvis performed on 06/17/2013 showed osseous and intra-abdominal metastatic disease. The primary source was not definitely identified in the chest or abdomen. The intra-abdominal pattern of spread is to the retroperitoneum (non--nodal) and right adrenal gland. On 06/23/2013 the patient underwent stereotactic radiotherapy to the solitary brain lesion under the care of Dr. Lisbeth Renshaw. He also underwent a course of palliative radiotherapy to the destructive bone lesion within the left iliac bone completed 06/30/2013. The patient was referred to Dr. Clance Boll and started on treatment with Temodar for one month but this was discontinued secondary to intolerance. The patient  was started on treatment with Nivolumab status  post 7 cycles and tolerated it fairly well. Imaging studies on 10/28/2013 showed improvement in his disease. The patient preferred to continue his current treatment with Nivolumab close to home in [place]. Dr. Clance Boll kindly referred him to me today for evaluation and to continue his current immunotherapy. When seen today the patient is feeling fine except for shortness of breath with exertion and occasional dizzy spells. He has a history of adrenal insufficiency and was on treatment with steroid in the past that was discontinued because of his current immunotherapy. He also has some itching but no significant skin rash. He denied having any significant cough, no chest pain or hemoptysis. He denied having any significant weight loss or night sweats. Family history significant for a mother who died from ovarian cancer and father died in an accident. The patient is a widow and he has 5 children. He was accompanied by his daughter Katharine Look today. He used to work in [employer] for a [employer]. He has a history of smoking one pack per day for around 55 years but quit in 1999. She also has a history of alcohol abuse and quit in 1991. No history of drug abuse.   HPI  Past Medical History  Diagnosis Date  . History of colon polyps   . Anemia   . Ulcer     history of Peptic Ulcer Disease  . IBS (irritable bowel syndrome)   . Anxiety   . BPH (benign prostatic hypertrophy)   . CAD (coronary artery disease)   . Atrial fibrillation   . AAA (abdominal aortic aneurysm)   . Chronic anticoagulation     On Coumadin therapy ( A Fib)  . Hypertension   . Hyperlipidemia   . History of DVT of lower extremity     pt does not recall this hx on 07/22/2013  . Anginal pain   . Shortness of breath   . Transient ischemic attack (TIA) 07/2012  . Fall at home Dec. 2014  . CHF (congestive heart failure)   . COPD (chronic obstructive pulmonary disease)   . History of radiation therapy 06/30/13 completed      5/5  srs brain  . Family history of anesthesia complication     "son goes bezerk" (07/22/2013)  . On home oxygen therapy     "2L; w/activity only recently & prn shortness of breath" (07/22/2013)  . H/O hiatal hernia   . Arthritis   . Kidney stone   . Melanoma Sept. 2013    ,Right  posterior shoulder  . Metastatic melanoma     [name] 07/22/2013  . Melanoma of neck     X 2  . Cancer     "somewhere in my abdomen" (07/22/2013)  . GERD (gastroesophageal reflux disease)   . Stroke 2015    "weakness on right side since"    Past Surgical History  Procedure Laterality Date  . Appendectomy  1957  . Cholecystectomy  1973  . Shoulder open rotator cuff repair Bilateral     "twice on the right; once on the left"  . Coronary artery bypass graft  1999    CABG X4  . Tibia fracture surgery Left 2011    tibial ORIF by Dr. Sharol Given  . Ankle fusion Left 2011  . Lymph node biopsy    . Total shoulder replacement Left Dec.  2014  . Kidney stone surgery      "once"  . Joint replacement    .  Cystoscopy w/ stone manipulation      "3-4 times"  . Tonsillectomy  1957  . Melanoma excision Right 05/2011; ~ 12/2011; 2014    shoulder;  neck; neck    Family History  Problem Relation Age of Onset  . Cancer Mother     stomach cancer  . Heart disease Brother   . Heart disease Brother   . Heart disease Brother     Social History History  Substance Use Topics  . Smoking status: Former Smoker -- 1.00 packs/day for 55 years    Types: Cigarettes    Quit date: 04/08/1996  . Smokeless tobacco: Never Used  . Alcohol Use: Yes     Comment: heavy alcohol use in the past; "fought it for ~ 8 yrs; totally quit in 1991"    Allergies  Allergen Reactions  . Morphine And Related Palpitations  . Codeine Nausea Only    Current Outpatient Prescriptions  Medication Sig Dispense Refill  . acetaminophen (TYLENOL) 650 MG CR tablet Take 1,300 mg by mouth every 8 (eight) hours as needed for pain.      Marland Kitchen albuterol  (PROVENTIL HFA;VENTOLIN HFA) 108 (90 BASE) MCG/ACT inhaler Inhale 2 puffs into the lungs every 6 (six) hours as needed for wheezing or shortness of breath.      Marland Kitchen aspirin EC 81 MG tablet Take 81 mg by mouth daily.      Marland Kitchen atorvastatin (LIPITOR) 40 MG tablet Take 40 mg by mouth daily.       . budesonide-formoterol (SYMBICORT) 160-4.5 MCG/ACT inhaler Inhale 2 puffs into the lungs 2 (two) times daily.      . citalopram (CELEXA) 10 MG tablet Take 10 mg by mouth daily.       Marland Kitchen docusate sodium (COLACE) 100 MG capsule Take 100 mg by mouth at bedtime.      . famotidine (PEPCID) 20 MG tablet Take 20 mg by mouth daily.      . meclizine (ANTIVERT) 25 MG tablet Take 25 mg by mouth daily.       . Melatonin 3 MG TABS Take 3 mg by mouth 1 day or 1 dose.      . metoprolol tartrate (LOPRESSOR) 25 MG tablet Take 25 mg by mouth 1 day or 1 dose.       . Multiple Vitamin (MULTIVITAMIN WITH MINERALS) TABS Take 1 tablet by mouth every evening.      . potassium chloride SA (K-DUR,KLOR-CON) 20 MEQ tablet Take 40 mEq by mouth.      . predniSONE (DELTASONE) 5 MG tablet Take 5 mg by mouth daily.      Marland Kitchen senna-docusate (RA SENNA PLUS) 8.6-50 MG per tablet 1 tablet daily as needed.      . furosemide (LASIX) 20 MG tablet Take 20 mg by mouth.      . nitroGLYCERIN (NITROSTAT) 0.4 MG SL tablet Place 0.4 mg under the tongue every 5 (five) minutes as needed for chest pain.      . OXYGEN-HELIUM IN daily as needed. Frequency:PHARMDIR   Dosage:0.0     Instructions:  Note:Diagnosis: COPD HT:5'9 WT: 89KG Home O2 @ 2LPM via [place], as needed to keep O2 >92%. Concentrator, supplies, & Portable tank for transport. Sats on RA @ rest       No current facility-administered medications for this visit.    Review of Systems  Constitutional: positive for fatigue Eyes: negative Ears, nose, mouth, throat, and face: negative Respiratory: positive for dyspnea on exertion Cardiovascular: negative Gastrointestinal:  negative Genitourinary:negative Integument/breast: negative Hematologic/lymphatic: negative Musculoskeletal:negative Neurological: positive for dizziness Behavioral/Psych: negative Endocrine: negative Allergic/Immunologic: negative  Physical Exam  WEX:HBZJI, healthy, no distress, well nourished and well developed SKIN: skin color, texture, turgor are normal, no rashes or significant lesions HEAD: Normocephalic, No masses, lesions, tenderness or abnormalities EYES: normal, PERRLA EARS: External ears normal, Canals clear OROPHARYNX:no exudate, no erythema and lips, buccal mucosa, and tongue normal  NECK: supple, no adenopathy, no JVD LYMPH:  no palpable lymphadenopathy, no hepatosplenomegaly LUNGS: clear to auscultation , and palpation HEART: regular rate & rhythm and no murmurs ABDOMEN:abdomen soft, non-tender, normal bowel sounds and no masses or organomegaly BACK: Back symmetric, no curvature., No CVA tenderness EXTREMITIES:no joint deformities, effusion, or inflammation, no edema, no skin discoloration, no clubbing  NEURO: alert & oriented x 3 with fluent speech, no focal motor/sensory deficits  PERFORMANCE STATUS: ECOG 2  LABORATORY DATA: Lab Results  Component Value Date   WBC 7.8 11/18/2013   HGB 11.2* 11/18/2013   HCT 36.3* 11/18/2013   MCV 79.7 11/18/2013   PLT 174 11/18/2013      Chemistry      Component Value Date/Time   NA 139 11/18/2013 1103   NA 140 07/23/2013 0102   K 4.9 11/18/2013 1103   K 4.1 07/23/2013 0102   CL 104 07/23/2013 0102   CO2 28 11/18/2013 1103   CO2 25 07/23/2013 0102   BUN 14.7 11/18/2013 1103   BUN 15 07/23/2013 0102   CREATININE 0.9 11/18/2013 1103   CREATININE 0.82 07/23/2013 0102   CREATININE 1.19 03/06/2011 0945      Component Value Date/Time   CALCIUM 9.9 11/18/2013 1103   CALCIUM 8.9 07/23/2013 0102   ALKPHOS 63 11/18/2013 1103   ALKPHOS 76 07/22/2013 1045   AST 13 11/18/2013 1103   AST 24 07/22/2013 1045   ALT 11 11/18/2013 1103   ALT 38  07/22/2013 1045   BILITOT 0.52 11/18/2013 1103   BILITOT 0.5 07/22/2013 1045       RADIOGRAPHIC STUDIES: No results found.  ASSESSMENT: This is a very pleasant 80 years old white male with metastatic malignant melanoma with lung, brain and bone metastasis. He is status post stereotactic radiotherapy to a solitary brain lesions in addition to palliative radiotherapy to the metastatic bone lesion in the left hip. He is currently undergoing immunotherapy with Nivolumab status post 7 cycles. His last CT scan of the chest, abdomen and pelvis showed improvement in his disease.   PLAN: I had a lengthy discussion with the patient and his daughter today about his current disease stage, prognosis and treatment options. I spent significant amount of time reviewing his previous records from [facility] as well as [facility]. The patient is currently undergoing treatment with immunotherapy and tolerating it fairly well. I recommended for him to continue his current treatment with Nivolumab and he will start cycle #8 today after we receive preauthorization for this treatment. I reminded the patient with the adverse effect of this treatment including but not limited to immune mediated colitis, hepatitis, nephritis, endocrine dysfunction. I will continue to monitor him closely during his treatment with repeat blood work every 2 weeks. He would come back for followup visit in 2 weeks for reevaluation with the next cycle his treatment. The patient was advised to call immediately if he has any concerning symptoms in the interval. The patient voices understanding of current disease status and treatment options and is in agreement with the current care plan.  All  questions were answered. The patient knows to call the clinic with any problems, questions or concerns. We can certainly see the patient much sooner if necessary.  Thank you so much for allowing me to participate in the care of Gabriel Wood. I  will continue to follow up the patient with you and assist in his care.  I spent 50 minutes counseling the patient face to face. The total time spent in the appointment was 80 minutes.  Disclaimer: This note was dictated with voice recognition software. Similar sounding words can inadvertently be transcribed and may not be corrected upon review.   Gabriel Prokop K. 11/19/2013, 10:08 AM

## 2013-11-19 NOTE — Telephone Encounter (Signed)
No adverse effect from nivolumab infusion. Some itching at night. Has had this medication at [facility] in past.

## 2013-11-23 ENCOUNTER — Other Ambulatory Visit: Payer: Medicare Other

## 2013-12-02 ENCOUNTER — Other Ambulatory Visit (HOSPITAL_BASED_OUTPATIENT_CLINIC_OR_DEPARTMENT_OTHER): Payer: Medicare Other

## 2013-12-02 ENCOUNTER — Ambulatory Visit (HOSPITAL_BASED_OUTPATIENT_CLINIC_OR_DEPARTMENT_OTHER): Payer: Medicare Other

## 2013-12-02 ENCOUNTER — Telehealth: Payer: Self-pay | Admitting: Internal Medicine

## 2013-12-02 ENCOUNTER — Ambulatory Visit (HOSPITAL_BASED_OUTPATIENT_CLINIC_OR_DEPARTMENT_OTHER): Payer: Medicare Other | Admitting: Internal Medicine

## 2013-12-02 ENCOUNTER — Encounter: Payer: Self-pay | Admitting: Internal Medicine

## 2013-12-02 VITALS — BP 94/46 | HR 74 | Temp 97.6°F | Resp 19 | Ht 63.0 in | Wt 169.0 lb

## 2013-12-02 DIAGNOSIS — C439 Malignant melanoma of skin, unspecified: Secondary | ICD-10-CM

## 2013-12-02 DIAGNOSIS — C799 Secondary malignant neoplasm of unspecified site: Secondary | ICD-10-CM

## 2013-12-02 DIAGNOSIS — Z5112 Encounter for antineoplastic immunotherapy: Secondary | ICD-10-CM

## 2013-12-02 DIAGNOSIS — R5381 Other malaise: Secondary | ICD-10-CM

## 2013-12-02 DIAGNOSIS — C7952 Secondary malignant neoplasm of bone marrow: Secondary | ICD-10-CM

## 2013-12-02 DIAGNOSIS — C7951 Secondary malignant neoplasm of bone: Secondary | ICD-10-CM

## 2013-12-02 DIAGNOSIS — C7800 Secondary malignant neoplasm of unspecified lung: Secondary | ICD-10-CM

## 2013-12-02 DIAGNOSIS — E348 Other specified endocrine disorders: Secondary | ICD-10-CM

## 2013-12-02 DIAGNOSIS — R5383 Other fatigue: Secondary | ICD-10-CM

## 2013-12-02 DIAGNOSIS — C7931 Secondary malignant neoplasm of brain: Secondary | ICD-10-CM

## 2013-12-02 DIAGNOSIS — C7949 Secondary malignant neoplasm of other parts of nervous system: Secondary | ICD-10-CM

## 2013-12-02 LAB — TSH CHCC: TSH: 1.093 m(IU)/L (ref 0.320–4.118)

## 2013-12-02 LAB — CBC WITH DIFFERENTIAL/PLATELET
BASO%: 0.5 % (ref 0.0–2.0)
BASOS ABS: 0 10*3/uL (ref 0.0–0.1)
EOS ABS: 0.3 10*3/uL (ref 0.0–0.5)
EOS%: 3.2 % (ref 0.0–7.0)
HCT: 36.9 % — ABNORMAL LOW (ref 38.4–49.9)
HEMOGLOBIN: 11.3 g/dL — AB (ref 13.0–17.1)
LYMPH#: 0.7 10*3/uL — AB (ref 0.9–3.3)
LYMPH%: 7.5 % — ABNORMAL LOW (ref 14.0–49.0)
MCH: 24.2 pg — ABNORMAL LOW (ref 27.2–33.4)
MCHC: 30.7 g/dL — ABNORMAL LOW (ref 32.0–36.0)
MCV: 78.7 fL — ABNORMAL LOW (ref 79.3–98.0)
MONO#: 0.9 10*3/uL (ref 0.1–0.9)
MONO%: 10.4 % (ref 0.0–14.0)
NEUT%: 78.4 % — ABNORMAL HIGH (ref 39.0–75.0)
NEUTROS ABS: 7.1 10*3/uL — AB (ref 1.5–6.5)
Platelets: 179 10*3/uL (ref 140–400)
RBC: 4.68 10*6/uL (ref 4.20–5.82)
RDW: 17 % — ABNORMAL HIGH (ref 11.0–14.6)
WBC: 9.1 10*3/uL (ref 4.0–10.3)

## 2013-12-02 LAB — COMPREHENSIVE METABOLIC PANEL (CC13)
ALBUMIN: 3.7 g/dL (ref 3.5–5.0)
ALT: 13 U/L (ref 0–55)
ANION GAP: 8 meq/L (ref 3–11)
AST: 13 U/L (ref 5–34)
Alkaline Phosphatase: 63 U/L (ref 40–150)
BUN: 20 mg/dL (ref 7.0–26.0)
CALCIUM: 10 mg/dL (ref 8.4–10.4)
CHLORIDE: 105 meq/L (ref 98–109)
CO2: 26 meq/L (ref 22–29)
Creatinine: 0.9 mg/dL (ref 0.7–1.3)
GLUCOSE: 95 mg/dL (ref 70–140)
POTASSIUM: 4.7 meq/L (ref 3.5–5.1)
SODIUM: 139 meq/L (ref 136–145)
TOTAL PROTEIN: 6.4 g/dL (ref 6.4–8.3)
Total Bilirubin: 0.63 mg/dL (ref 0.20–1.20)

## 2013-12-02 MED ORDER — SODIUM CHLORIDE 0.9 % IV SOLN
3.0000 mg/kg | Freq: Once | INTRAVENOUS | Status: AC
  Administered 2013-12-02: 230 mg via INTRAVENOUS
  Filled 2013-12-02: qty 23

## 2013-12-02 MED ORDER — SODIUM CHLORIDE 0.9 % IV SOLN
Freq: Once | INTRAVENOUS | Status: AC
Start: 2013-12-02 — End: 2013-12-02
  Administered 2013-12-02: 10:00:00 via INTRAVENOUS

## 2013-12-02 NOTE — Progress Notes (Signed)
[place] Telephone:(336) [phone]   Fax:(336) [phone]  OFFICE PROGRESS NOTE  Gabriel Huh, MD [address] [place] [address]  DIAGNOSIS: metastatic malignant melanoma with lung, brain and bone metastasis. He is status post stereotactic radiotherapy to a solitary brain lesions in addition to palliative radiotherapy to the metastatic bone lesion in the left hip diagnosed in September of 2013 initially as early stage disease with evidence of metastasis in July 2014.  PRIOR THERAPY: 1) the patient underwent cervical lymph node dissection under the care of Dr. Freddi Wood and the lymph nodes were positive for metastatic melanoma. Molecular studies showed negative BRAF/NRAS/KIT mutation but positive PIK3CA mutation ([id number]).  2) palliative course of external beam radiation completed 10/06/2012. 3) a course of palliative radiotherapy to the destructive bone lesion within the left iliac bone completed 06/30/2013 under the care of Dr. Lisbeth Wood. 4) Status post 1 month treatment with a Compazine discontinued secondary to intolerance   CURRENT THERAPY: Nivolumab 3 mg/Kg every 2 weeks status post 8 cycles. The first 7 cycles were Wood at [facility] under the care of Dr. Clance Wood.  INTERVAL HISTORY: Gabriel Wood 80 y.o. male returns to the clinic today for followup visit accompanied by his son and his daughter-in-law. The patient is feeling fine today with no specific complaints. He tolerated the last cycle of his immunotherapy fairly well with no significant adverse effects. He denied having any significant fever or chills. He has no nausea or vomiting. He denied having any significant skin rash or diarrhea. He had one episode of few minutes of left chest pain more than a week ago, improved significantly after taking his nitroglycerin. The patient denied having any significant shortness of breath, cough or hemoptysis. He is here today to start cycle #9 of  his immunotherapy.  MEDICAL HISTORY: Past Medical History  Diagnosis Date  . History of colon polyps   . Anemia   . Ulcer     history of Peptic Ulcer Disease  . IBS (irritable bowel syndrome)   . Anxiety   . BPH (benign prostatic hypertrophy)   . CAD (coronary artery disease)   . Atrial fibrillation   . AAA (abdominal aortic aneurysm)   . Chronic anticoagulation     On Coumadin therapy ( A Fib)  . Hypertension   . Hyperlipidemia   . History of DVT of lower extremity     pt does not recall this hx on 07/22/2013  . Anginal pain   . Shortness of breath   . Transient ischemic attack (TIA) 07/2012  . Fall at home Dec. 2014  . CHF (congestive heart failure)   . COPD (chronic obstructive pulmonary disease)   . History of radiation therapy 06/30/13 completed      5/5 srs brain  . Family history of anesthesia complication     "son goes bezerk" (07/22/2013)  . On home oxygen therapy     "2L; w/activity only recently & prn shortness of breath" (07/22/2013)  . H/O hiatal hernia   . Arthritis   . Kidney stone   . Melanoma Sept. 2013    ,Right  posterior shoulder  . Metastatic melanoma     Gabriel Wood 07/22/2013  . Melanoma of neck     X 2  . Cancer     "somewhere in my abdomen" (07/22/2013)  . GERD (gastroesophageal reflux disease)   . Stroke 2015    "weakness on right side since"  ALLERGIES:  is allergic to morphine and related and codeine.  MEDICATIONS:  Current Outpatient Prescriptions  Medication Sig Dispense Refill  . acetaminophen (TYLENOL) 650 MG CR tablet Take 1,300 mg by mouth every 8 (eight) hours as needed for pain.      Marland Kitchen aspirin EC 81 MG tablet Take 81 mg by mouth daily.      Marland Kitchen atorvastatin (LIPITOR) 40 MG tablet Take 40 mg by mouth daily.       . budesonide-formoterol (SYMBICORT) 160-4.5 MCG/ACT inhaler Inhale 2 puffs into the lungs 2 (two) times daily.      . citalopram (CELEXA) 10 MG tablet Take 10 mg by mouth daily.       Marland Kitchen docusate sodium (COLACE) 100 MG capsule  Take 100 mg by mouth at bedtime.      . famotidine (PEPCID) 20 MG tablet Take 20 mg by mouth daily.      . meclizine (ANTIVERT) 25 MG tablet Take 25 mg by mouth daily.       . Melatonin 3 MG TABS Take 3 mg by mouth 1 day or 1 dose.      . metoprolol tartrate (LOPRESSOR) 25 MG tablet Take 25 mg by mouth 1 day or 1 dose.       . Multiple Vitamin (MULTIVITAMIN WITH MINERALS) TABS Take 1 tablet by mouth every evening.      . OXYGEN-HELIUM IN daily as needed. Frequency:PHARMDIR   Dosage:0.0     Instructions:  Note:Diagnosis: COPD HT:5'9 WT: 89KG Home O2 @ 2LPM via Gabriel Wood, as needed to keep O2 >92%. Concentrator, supplies, & Portable tank for transport. Sats on RA @ rest      . potassium chloride SA (K-DUR,KLOR-CON) 20 MEQ tablet Take 40 mEq by mouth.      . predniSONE (DELTASONE) 5 MG tablet Take 5 mg by mouth daily.      Marland Kitchen senna-docusate (RA SENNA PLUS) 8.6-50 MG per tablet 1 tablet daily as needed.      Marland Kitchen albuterol (PROVENTIL HFA;VENTOLIN HFA) 108 (90 BASE) MCG/ACT inhaler Inhale 2 puffs into the lungs every 6 (six) hours as needed for wheezing or shortness of breath.      . furosemide (LASIX) 20 MG tablet Take 20 mg by mouth.      . nitroGLYCERIN (NITROSTAT) 0.4 MG SL tablet Place 0.4 mg under the tongue every 5 (five) minutes as needed for chest pain.       No current facility-administered medications for this visit.    SURGICAL HISTORY:  Past Surgical History  Procedure Laterality Date  . Appendectomy  1957  . Cholecystectomy  1973  . Shoulder open rotator cuff repair Bilateral     "twice on the right; once on the left"  . Coronary artery bypass graft  1999    CABG X4  . Tibia fracture surgery Left 2011    tibial ORIF by Dr. Sharol Wood  . Ankle fusion Left 2011  . Lymph node biopsy    . Total shoulder replacement Left Dec.  2014  . Kidney stone surgery      "once"  . Joint replacement    . Cystoscopy w/ stone manipulation      "3-4 times"  . Tonsillectomy  1957  . Melanoma excision Right  05/2011; ~ 12/2011; 2014    shoulder;  neck; neck    REVIEW OF SYSTEMS:  A comprehensive review of systems was negative except for: Constitutional: positive for fatigue   PHYSICAL EXAMINATION: General appearance: alert, cooperative and  no distress Head: Normocephalic, without obvious abnormality, atraumatic Neck: no adenopathy, no JVD, supple, symmetrical, trachea midline and thyroid not enlarged, symmetric, no tenderness/mass/nodules Lymph nodes: Cervical, supraclavicular, and axillary nodes normal. Resp: clear to auscultation bilaterally Back: symmetric, no curvature. ROM normal. No CVA tenderness. Cardio: regular rate and rhythm, S1, S2 normal, no murmur, click, rub or gallop GI: soft, non-tender; bowel sounds normal; no masses,  no organomegaly Extremities: extremities normal, atraumatic, no cyanosis or edema  ECOG PERFORMANCE STATUS: 2 - Symptomatic, <50% confined to bed  Blood pressure 94/46, pulse 74, temperature 97.6 F (36.4 C), temperature source Oral, resp. rate 19, height _0  (1.6 m), weight 169 lb (76.658 kg).  LABORATORY DATA: Lab Results  Component Value Date   WBC 9.1 12/02/2013   HGB 11.3* 12/02/2013   HCT 36.9* 12/02/2013   MCV 78.7* 12/02/2013   PLT 179 12/02/2013      Chemistry      Component Value Date/Time   NA 139 12/02/2013 0852   NA 140 07/23/2013 0102   K 4.7 12/02/2013 0852   K 4.1 07/23/2013 0102   CL 104 07/23/2013 0102   CO2 26 12/02/2013 0852   CO2 25 07/23/2013 0102   BUN 20.0 12/02/2013 0852   BUN 15 07/23/2013 0102   CREATININE 0.9 12/02/2013 0852   CREATININE 0.82 07/23/2013 0102   CREATININE 1.19 03/06/2011 0945      Component Value Date/Time   CALCIUM 10.0 12/02/2013 0852   CALCIUM 8.9 07/23/2013 0102   ALKPHOS 63 12/02/2013 0852   ALKPHOS 76 07/22/2013 1045   AST 13 12/02/2013 0852   AST 24 07/22/2013 1045   ALT 13 12/02/2013 0852   ALT 38 07/22/2013 1045   BILITOT 0.63 12/02/2013 0852   BILITOT 0.5 07/22/2013 1045       RADIOGRAPHIC  STUDIES: No results found.  ASSESSMENT AND PLAN: This is a very pleasant 80 years old white male with history of metastatic malignant melanoma currently on treatment with immunotherapy with Nivolumab status post 8 cycles.  The patient is tolerating his treatment fairly well with no significant adverse effects.  I recommended for him to proceed with cycle #9 today as scheduled. He would come back for followup visit in 2 weeks with the next cycle of his treatment.  He was advised to call immediately if he has any concerning symptoms in the interval. The patient voices understanding of current disease status and treatment options and is in agreement with the current care plan.  All questions were answered. The patient knows to call the clinic with any problems, questions or concerns. We can certainly see the patient much sooner if necessary.  Disclaimer: This note was dictated with voice recognition software. Similar sounding words can inadvertently be transcribed and may not be corrected upon review.

## 2013-12-02 NOTE — Telephone Encounter (Signed)
gv and printed appt sched and avs for opt for Sept...sed added tx.

## 2013-12-02 NOTE — Patient Instructions (Signed)
[facility] Discharge Instructions for Patients Receiving Chemotherapy  Today you received the following chemotherapy agents Nivolumab  To help prevent nausea and vomiting after your treatment, we encourage you to take your nausea medication as directed/prescribed. If you develop nausea and vomiting that is not controlled by your nausea medication, call the clinic.   BELOW ARE SYMPTOMS THAT SHOULD BE REPORTED IMMEDIATELY:  *FEVER GREATER THAN 100.5 F  *CHILLS WITH OR WITHOUT FEVER  NAUSEA AND VOMITING THAT IS NOT CONTROLLED WITH YOUR NAUSEA MEDICATION  *UNUSUAL SHORTNESS OF BREATH  *UNUSUAL BRUISING OR BLEEDING  TENDERNESS IN MOUTH AND THROAT WITH OR WITHOUT PRESENCE OF ULCERS  *URINARY PROBLEMS  *BOWEL PROBLEMS  UNUSUAL RASH Items with * indicate a potential emergency and should be followed up as soon as possible.  Feel free to call the clinic you have any questions or concerns. The clinic phone number is [phone].    

## 2013-12-16 ENCOUNTER — Encounter: Payer: Self-pay | Admitting: Physician Assistant

## 2013-12-16 ENCOUNTER — Other Ambulatory Visit (HOSPITAL_BASED_OUTPATIENT_CLINIC_OR_DEPARTMENT_OTHER): Payer: Medicare Other

## 2013-12-16 ENCOUNTER — Telehealth: Payer: Self-pay | Admitting: *Deleted

## 2013-12-16 ENCOUNTER — Ambulatory Visit (HOSPITAL_BASED_OUTPATIENT_CLINIC_OR_DEPARTMENT_OTHER): Payer: Medicare Other | Admitting: Physician Assistant

## 2013-12-16 ENCOUNTER — Telehealth: Payer: Self-pay | Admitting: Physician Assistant

## 2013-12-16 ENCOUNTER — Ambulatory Visit (HOSPITAL_BASED_OUTPATIENT_CLINIC_OR_DEPARTMENT_OTHER): Payer: Medicare Other

## 2013-12-16 VITALS — BP 99/50 | HR 69 | Temp 97.6°F | Resp 17 | Ht 63.0 in | Wt 169.1 lb

## 2013-12-16 DIAGNOSIS — C434 Malignant melanoma of scalp and neck: Secondary | ICD-10-CM

## 2013-12-16 DIAGNOSIS — C799 Secondary malignant neoplasm of unspecified site: Secondary | ICD-10-CM

## 2013-12-16 DIAGNOSIS — C7931 Secondary malignant neoplasm of brain: Secondary | ICD-10-CM

## 2013-12-16 DIAGNOSIS — C7949 Secondary malignant neoplasm of other parts of nervous system: Secondary | ICD-10-CM

## 2013-12-16 DIAGNOSIS — Z23 Encounter for immunization: Secondary | ICD-10-CM

## 2013-12-16 DIAGNOSIS — C7951 Secondary malignant neoplasm of bone: Secondary | ICD-10-CM

## 2013-12-16 DIAGNOSIS — C439 Malignant melanoma of skin, unspecified: Secondary | ICD-10-CM

## 2013-12-16 DIAGNOSIS — Z5112 Encounter for antineoplastic immunotherapy: Secondary | ICD-10-CM

## 2013-12-16 DIAGNOSIS — C7952 Secondary malignant neoplasm of bone marrow: Secondary | ICD-10-CM

## 2013-12-16 LAB — CBC WITH DIFFERENTIAL/PLATELET
BASO%: 0.3 % (ref 0.0–2.0)
Basophils Absolute: 0 10*3/uL (ref 0.0–0.1)
EOS%: 1.8 % (ref 0.0–7.0)
Eosinophils Absolute: 0.2 10*3/uL (ref 0.0–0.5)
HEMATOCRIT: 38.2 % — AB (ref 38.4–49.9)
HGB: 11.8 g/dL — ABNORMAL LOW (ref 13.0–17.1)
LYMPH%: 8 % — AB (ref 14.0–49.0)
MCH: 23.9 pg — AB (ref 27.2–33.4)
MCHC: 30.8 g/dL — AB (ref 32.0–36.0)
MCV: 77.8 fL — AB (ref 79.3–98.0)
MONO#: 0.8 10*3/uL (ref 0.1–0.9)
MONO%: 8.1 % (ref 0.0–14.0)
NEUT#: 7.6 10*3/uL — ABNORMAL HIGH (ref 1.5–6.5)
NEUT%: 81.8 % — AB (ref 39.0–75.0)
PLATELETS: 163 10*3/uL (ref 140–400)
RBC: 4.91 10*6/uL (ref 4.20–5.82)
RDW: 17.7 % — ABNORMAL HIGH (ref 11.0–14.6)
WBC: 9.3 10*3/uL (ref 4.0–10.3)
lymph#: 0.7 10*3/uL — ABNORMAL LOW (ref 0.9–3.3)

## 2013-12-16 LAB — COMPREHENSIVE METABOLIC PANEL (CC13)
ALT: 10 U/L (ref 0–55)
ANION GAP: 9 meq/L (ref 3–11)
AST: 12 U/L (ref 5–34)
Albumin: 3.6 g/dL (ref 3.5–5.0)
Alkaline Phosphatase: 65 U/L (ref 40–150)
BILIRUBIN TOTAL: 0.65 mg/dL (ref 0.20–1.20)
BUN: 19.6 mg/dL (ref 7.0–26.0)
CO2: 25 mEq/L (ref 22–29)
Calcium: 10 mg/dL (ref 8.4–10.4)
Chloride: 105 mEq/L (ref 98–109)
Creatinine: 1.1 mg/dL (ref 0.7–1.3)
Glucose: 103 mg/dl (ref 70–140)
Potassium: 4.4 mEq/L (ref 3.5–5.1)
Sodium: 138 mEq/L (ref 136–145)
Total Protein: 6.3 g/dL — ABNORMAL LOW (ref 6.4–8.3)

## 2013-12-16 MED ORDER — SODIUM CHLORIDE 0.9 % IV SOLN
3.0000 mg/kg | Freq: Once | INTRAVENOUS | Status: AC
  Administered 2013-12-16: 230 mg via INTRAVENOUS
  Filled 2013-12-16: qty 23

## 2013-12-16 MED ORDER — SODIUM CHLORIDE 0.9 % IV SOLN
Freq: Once | INTRAVENOUS | Status: AC
  Administered 2013-12-16: 10:00:00 via INTRAVENOUS

## 2013-12-16 MED ORDER — INFLUENZA VAC SPLIT QUAD 0.5 ML IM SUSY
0.5000 mL | PREFILLED_SYRINGE | Freq: Once | INTRAMUSCULAR | Status: DC
  Filled 2013-12-16: qty 0.5

## 2013-12-16 NOTE — Patient Instructions (Signed)
[facility] Discharge Instructions for Patients Receiving Chemotherapy  Today you received the following chemotherapy agents: Nivolumab  To help prevent nausea and vomiting after your treatment, we encourage you to take your nausea medication as prescribed.   If you develop nausea and vomiting that is not controlled by your nausea medication, call the clinic.   BELOW ARE SYMPTOMS THAT SHOULD BE REPORTED IMMEDIATELY:  *FEVER GREATER THAN 100.5 F  *CHILLS WITH OR WITHOUT FEVER  NAUSEA AND VOMITING THAT IS NOT CONTROLLED WITH YOUR NAUSEA MEDICATION  *UNUSUAL SHORTNESS OF BREATH  *UNUSUAL BRUISING OR BLEEDING  TENDERNESS IN MOUTH AND THROAT WITH OR WITHOUT PRESENCE OF ULCERS  *URINARY PROBLEMS  *BOWEL PROBLEMS  UNUSUAL RASH Items with * indicate a potential emergency and should be followed up as soon as possible.  Feel free to call the clinic you have any questions or concerns. The clinic phone number is [phone].    

## 2013-12-16 NOTE — Telephone Encounter (Signed)
Pt confirmed labs/ov per 09/10 POF, sent msg to add chemo, gave pt AVS...KJ °

## 2013-12-16 NOTE — Assessment & Plan Note (Signed)
The patient is a pleasant 80 year old Caucasian male with metastatic malignant melanoma with lung, brain and bone metastasis. Patient is currently being treated with nodal metastases at 3 mg per kilogram every 2 weeks status post 9 cycles. The first 7 cycles were given at [facility] under the care of Dr. Clance Boll. He is tolerating his treatment without difficulty. He'll receive cycle #10 today as scheduled. Followup in 2 weeks for cycle #11.

## 2013-12-16 NOTE — Patient Instructions (Signed)
Followup with your cardiologist regarding your blood pressure medications in your issues with your atrial fibrillation Return in 2 weeks prior to your next scheduled cycle of immunotherapy with Nivolumab

## 2013-12-16 NOTE — Telephone Encounter (Signed)
Per staff message and POF I have scheduled appts. Advised scheduler of appts. JMW  

## 2013-12-16 NOTE — Progress Notes (Signed)
[place] [phone]   [phone]  SHARED VISIT PROGRESS NOTE  Simona Huh, MD [address] [place] [address]  DIAGNOSIS: metastatic malignant melanoma with lung, brain and bone metastasis. He is status post stereotactic radiotherapy to a solitary brain lesions in addition to palliative radiotherapy to the metastatic bone lesion in the left hip diagnosed in September of 2013 initially as early stage disease with evidence of metastasis in July 2014.  PRIOR THERAPY: 1) the patient underwent cervical lymph node dissection under the care of Dr. Freddi Che and the lymph nodes were positive for metastatic melanoma. Molecular studies showed negative BRAF/NRAS/KIT mutation but positive PIK3CA mutation ([id number]).  2) palliative course of external beam radiation completed 10/06/2012. 3) a course of palliative radiotherapy to the destructive bone lesion within the left iliac bone completed 06/30/2013 under the care of Dr. Lisbeth Renshaw. 4) Status post 1 month treatment with a Compazine discontinued secondary to intolerance   CURRENT THERAPY: Nivolumab 3 mg/Kg every 2 weeks status post 9 cycles. The first 7 cycles were given at [facility] under the care of Dr. Clance Boll.  INTERVAL HISTORY: Gabriel Wood 80 y.o. male returns to the clinic today for followup visit accompanied by his son and his daughter-in-law.  He tolerated the last cycle of his immunotherapy fairly well with no significant adverse effects. He denied having any significant fever or chills. He has no nausea or vomiting. He denied having any significant skin rash or diarrhea. He had one episode of few minutes of left chest pain more than a week ago, improved significantly after taking his nitroglycerin. He reports issues with his blood pressure medications and his atrial fibrillation. His primary care physician has been adjusting his blood pressure medication. It has been a  while since he see his cardiologist.The patient denied having any significant shortness of breath, cough or hemoptysis. He is here today to start cycle #10 of his immunotherapy.  MEDICAL HISTORY: Past Medical History  Diagnosis Date  . History of colon polyps   . Anemia   . Ulcer     history of Peptic Ulcer Disease  . IBS (irritable bowel syndrome)   . Anxiety   . BPH (benign prostatic hypertrophy)   . CAD (coronary artery disease)   . Atrial fibrillation   . AAA (abdominal aortic aneurysm)   . Chronic anticoagulation     On Coumadin therapy ( A Fib)  . Hypertension   . Hyperlipidemia   . History of DVT of lower extremity     pt does not recall this hx on 07/22/2013  . Anginal pain   . Shortness of breath   . Transient ischemic attack (TIA) 07/2012  . Fall at home Dec. 2014  . CHF (congestive heart failure)   . COPD (chronic obstructive pulmonary disease)   . History of radiation therapy 06/30/13 completed      5/5 srs brain  . Family history of anesthesia complication     "son goes bezerk" (07/22/2013)  . On home oxygen therapy     "2L; w/activity only recently & prn shortness of breath" (07/22/2013)  . H/O hiatal hernia   . Arthritis   . Kidney stone   . Melanoma Sept. 2013    ,Right  posterior shoulder  . Metastatic melanoma     [name] 07/22/2013  . Melanoma of neck     X 2  . Cancer     "somewhere in my  abdomen" (07/22/2013)  . GERD (gastroesophageal reflux disease)   . Stroke 2015    "weakness on right side since"    ALLERGIES:  is allergic to morphine and related and codeine.  MEDICATIONS:  Current Outpatient Prescriptions  Medication Sig Dispense Refill  . acetaminophen (TYLENOL) 650 MG CR tablet Take 1,300 mg by mouth every 8 (eight) hours as needed for pain.      Marland Kitchen albuterol (PROVENTIL HFA;VENTOLIN HFA) 108 (90 BASE) MCG/ACT inhaler Inhale 2 puffs into the lungs every 6 (six) hours as needed for wheezing or shortness of breath.      Marland Kitchen aspirin EC 81 MG tablet  Take 81 mg by mouth daily.      Marland Kitchen atorvastatin (LIPITOR) 40 MG tablet Take 40 mg by mouth daily.       . budesonide-formoterol (SYMBICORT) 160-4.5 MCG/ACT inhaler Inhale 2 puffs into the lungs 2 (two) times daily.      . citalopram (CELEXA) 10 MG tablet Take 10 mg by mouth daily.       Marland Kitchen docusate sodium (COLACE) 100 MG capsule Take 100 mg by mouth at bedtime.      . famotidine (PEPCID) 20 MG tablet Take 20 mg by mouth daily.      . furosemide (LASIX) 20 MG tablet Take 20 mg by mouth.      . meclizine (ANTIVERT) 25 MG tablet Take 25 mg by mouth daily.       . Melatonin 3 MG TABS Take 3 mg by mouth 1 day or 1 dose.      . metoprolol tartrate (LOPRESSOR) 25 MG tablet Take 25 mg by mouth 1 day or 1 dose.       . Multiple Vitamin (MULTIVITAMIN WITH MINERALS) TABS Take 1 tablet by mouth every evening.      . nitroGLYCERIN (NITROSTAT) 0.4 MG SL tablet Place 0.4 mg under the tongue every 5 (five) minutes as needed for chest pain.      . OXYGEN-HELIUM IN daily as needed. Frequency:PHARMDIR   Dosage:0.0     Instructions:  Note:Diagnosis: COPD HT:5'9 WT: 89KG Home O2 @ 2LPM via [place], as needed to keep O2 >92%. Concentrator, supplies, & Portable tank for transport. Sats on RA @ rest      . potassium chloride SA (K-DUR,KLOR-CON) 20 MEQ tablet Take 40 mEq by mouth.      . predniSONE (DELTASONE) 5 MG tablet Take 5 mg by mouth daily.      Marland Kitchen senna-docusate (RA SENNA PLUS) 8.6-50 MG per tablet 1 tablet daily as needed.       Current Facility-Administered Medications  Medication Dose Route Frequency Provider Last Rate Last Dose  . Influenza vac split quadrivalent PF (FLUARIX) injection 0.5 mL  0.5 mL Intramuscular Once Carlton Adam, PA-C        SURGICAL HISTORY:  Past Surgical History  Procedure Laterality Date  . Appendectomy  1957  . Cholecystectomy  1973  . Shoulder open rotator cuff repair Bilateral     "twice on the right; once on the left"  . Coronary artery bypass graft  1999    CABG X4  . Tibia  fracture surgery Left 2011    tibial ORIF by Dr. Sharol Given  . Ankle fusion Left 2011  . Lymph node biopsy    . Total shoulder replacement Left Dec.  2014  . Kidney stone surgery      "once"  . Joint replacement    . Cystoscopy w/ stone manipulation      "  3-4 times"  . Tonsillectomy  1957  . Melanoma excision Right 05/2011; ~ 12/2011; 2014    shoulder;  neck; neck    REVIEW OF SYSTEMS:  A comprehensive review of systems was negative except for: Constitutional: positive for fatigue Cardiovascular: positive for irregular heart beat and Occasional chest discomfort related to his atrial fibrillation   PHYSICAL EXAMINATION: General appearance: alert, cooperative and no distress Head: Normocephalic, without obvious abnormality, atraumatic Neck: no adenopathy, no JVD, supple, symmetrical, trachea midline and thyroid not enlarged, symmetric, no tenderness/mass/nodules Lymph nodes: Cervical, supraclavicular, and axillary nodes normal. Resp: clear to auscultation bilaterally Back: symmetric, no curvature. ROM normal. No CVA tenderness. Cardio: regular rate and rhythm, S1, S2 normal, no murmur, click, rub or gallop GI: soft, non-tender; bowel sounds normal; no masses,  no organomegaly Extremities: extremities normal, atraumatic, no cyanosis or edema  ECOG PERFORMANCE STATUS: 2 - Symptomatic, <50% confined to bed  Blood pressure 99/50, pulse 69, temperature 97.6 F (36.4 C), temperature source Oral, resp. rate 17, height 5' 3"  (1.6 m), weight 169 lb 1.6 oz (76.703 kg), SpO2 95.00%.  LABORATORY DATA: Lab Results  Component Value Date   WBC 9.3 12/16/2013   HGB 11.8* 12/16/2013   HCT 38.2* 12/16/2013   MCV 77.8* 12/16/2013   PLT 163 12/16/2013      Chemistry      Component Value Date/Time   NA 138 12/16/2013 0756   NA 140 07/23/2013 0102   K 4.4 12/16/2013 0756   K 4.1 07/23/2013 0102   CL 104 07/23/2013 0102   CO2 25 12/16/2013 0756   CO2 25 07/23/2013 0102   BUN 19.6 12/16/2013 0756   BUN 15  07/23/2013 0102   CREATININE 1.1 12/16/2013 0756   CREATININE 0.82 07/23/2013 0102   CREATININE 1.19 03/06/2011 0945      Component Value Date/Time   CALCIUM 10.0 12/16/2013 0756   CALCIUM 8.9 07/23/2013 0102   ALKPHOS 65 12/16/2013 0756   ALKPHOS 76 07/22/2013 1045   AST 12 12/16/2013 0756   AST 24 07/22/2013 1045   ALT 10 12/16/2013 0756   ALT 38 07/22/2013 1045   BILITOT 0.65 12/16/2013 0756   BILITOT 0.5 07/22/2013 1045       RADIOGRAPHIC STUDIES: No results found.  ASSESSMENT AND PLAN: This is a very pleasant 80 years old white male with history of metastatic malignant melanoma currently on treatment with immunotherapy with Nivolumab status post 9 cycles.  The patient is tolerating his treatment fairly well with no significant adverse effects. Patient was discussed with them also seen by Dr. Julien Nordmann. He would like a flu vaccine. We will check with our oncology pharmacists regarding flu vaccination concurrent with immunotherapy. Once we have a clear answer regarding this, we will either continue to hold the flu vaccine for proceed with it depending on the response we get.   He will proceed with cycle #10 today as scheduled. He you'll followup in 2 weeks with the next cycle of his treatment. We'll plan to schedule the restaging CT scan to reevaluate his disease when he returns in 2 weeks. Regarding his issues with his blood pressure medications and atrial fibrillation, patient was advised to see his cardiologist. Both patient and his family members voiced understanding. He was advised to call immediately if he has any concerning symptoms in the interval. The patient voices understanding of current disease status and treatment options and is in agreement with the current care plan.  All questions were answered. The patient knows  to call the clinic with any problems, questions or concerns. We can certainly see the patient much sooner if necessary.  Carlton Adam  PA-C  ADDENDUM: Hematology/Oncology Attending: I had a face to face encounter with the patient. I recommended her care plan. This is a very pleasant 80 years old white male with metastatic malignant melanoma currently undergoing immunotherapy with single agent Nivolumab status post 9 cycles. He is tolerating his treatment fairly well with no significant adverse effects. He denied having any significant skin rash or diarrhea. The patient denied having any fever or chills, no nausea or vomiting. I recommended for the patient to proceed with cycle #2 today as scheduled. He would come back for followup visit in 2 weeks for reevaluation. He would have repeat CT scan of the chest, abdomen and pelvis after cycle #11. He was advised to call immediately if she has any concerning symptoms in the interval.  Disclaimer: This note was dictated with voice recognition software. Similar sounding words can inadvertently be transcribed and may not be corrected upon review. Eilleen Kempf., MD 12/18/2013

## 2013-12-30 ENCOUNTER — Ambulatory Visit (HOSPITAL_BASED_OUTPATIENT_CLINIC_OR_DEPARTMENT_OTHER): Payer: Medicare Other | Admitting: Physician Assistant

## 2013-12-30 ENCOUNTER — Encounter: Payer: Self-pay | Admitting: Physician Assistant

## 2013-12-30 ENCOUNTER — Other Ambulatory Visit: Payer: Medicare Other

## 2013-12-30 ENCOUNTER — Ambulatory Visit (HOSPITAL_BASED_OUTPATIENT_CLINIC_OR_DEPARTMENT_OTHER): Payer: Medicare Other

## 2013-12-30 ENCOUNTER — Telehealth: Payer: Self-pay | Admitting: Internal Medicine

## 2013-12-30 ENCOUNTER — Other Ambulatory Visit (HOSPITAL_BASED_OUTPATIENT_CLINIC_OR_DEPARTMENT_OTHER): Payer: Medicare Other

## 2013-12-30 ENCOUNTER — Encounter: Payer: Self-pay | Admitting: Radiation Oncology

## 2013-12-30 VITALS — BP 107/46 | HR 74 | Temp 97.8°F | Resp 18 | Ht 63.0 in | Wt 171.8 lb

## 2013-12-30 DIAGNOSIS — C7952 Secondary malignant neoplasm of bone marrow: Secondary | ICD-10-CM

## 2013-12-30 DIAGNOSIS — I200 Unstable angina: Secondary | ICD-10-CM

## 2013-12-30 DIAGNOSIS — C7951 Secondary malignant neoplasm of bone: Secondary | ICD-10-CM

## 2013-12-30 DIAGNOSIS — Z5112 Encounter for antineoplastic immunotherapy: Secondary | ICD-10-CM

## 2013-12-30 DIAGNOSIS — C799 Secondary malignant neoplasm of unspecified site: Secondary | ICD-10-CM

## 2013-12-30 DIAGNOSIS — C439 Malignant melanoma of skin, unspecified: Secondary | ICD-10-CM

## 2013-12-30 DIAGNOSIS — E348 Other specified endocrine disorders: Secondary | ICD-10-CM

## 2013-12-30 DIAGNOSIS — C7800 Secondary malignant neoplasm of unspecified lung: Secondary | ICD-10-CM

## 2013-12-30 DIAGNOSIS — C7949 Secondary malignant neoplasm of other parts of nervous system: Secondary | ICD-10-CM

## 2013-12-30 DIAGNOSIS — C7931 Secondary malignant neoplasm of brain: Secondary | ICD-10-CM

## 2013-12-30 DIAGNOSIS — R5383 Other fatigue: Secondary | ICD-10-CM

## 2013-12-30 DIAGNOSIS — R5381 Other malaise: Secondary | ICD-10-CM

## 2013-12-30 LAB — CBC WITH DIFFERENTIAL/PLATELET
BASO%: 0.2 % (ref 0.0–2.0)
Basophils Absolute: 0 10*3/uL (ref 0.0–0.1)
EOS%: 1.4 % (ref 0.0–7.0)
Eosinophils Absolute: 0.1 10*3/uL (ref 0.0–0.5)
HCT: 34.5 % — ABNORMAL LOW (ref 38.4–49.9)
HGB: 10.7 g/dL — ABNORMAL LOW (ref 13.0–17.1)
LYMPH%: 9.5 % — AB (ref 14.0–49.0)
MCH: 24.2 pg — ABNORMAL LOW (ref 27.2–33.4)
MCHC: 31 g/dL — AB (ref 32.0–36.0)
MCV: 77.9 fL — ABNORMAL LOW (ref 79.3–98.0)
MONO#: 0.7 10*3/uL (ref 0.1–0.9)
MONO%: 11.7 % (ref 0.0–14.0)
NEUT#: 4.3 10*3/uL (ref 1.5–6.5)
NEUT%: 77.2 % — ABNORMAL HIGH (ref 39.0–75.0)
PLATELETS: 144 10*3/uL (ref 140–400)
RBC: 4.43 10*6/uL (ref 4.20–5.82)
RDW: 17.7 % — ABNORMAL HIGH (ref 11.0–14.6)
WBC: 5.6 10*3/uL (ref 4.0–10.3)
lymph#: 0.5 10*3/uL — ABNORMAL LOW (ref 0.9–3.3)

## 2013-12-30 LAB — COMPREHENSIVE METABOLIC PANEL (CC13)
ALT: 11 U/L (ref 0–55)
AST: 13 U/L (ref 5–34)
Albumin: 3.2 g/dL — ABNORMAL LOW (ref 3.5–5.0)
Alkaline Phosphatase: 77 U/L (ref 40–150)
Anion Gap: 9 mEq/L (ref 3–11)
BUN: 18.2 mg/dL (ref 7.0–26.0)
CO2: 26 meq/L (ref 22–29)
Calcium: 9.8 mg/dL (ref 8.4–10.4)
Chloride: 104 mEq/L (ref 98–109)
Creatinine: 1.1 mg/dL (ref 0.7–1.3)
Glucose: 102 mg/dl (ref 70–140)
Potassium: 4 mEq/L (ref 3.5–5.1)
SODIUM: 139 meq/L (ref 136–145)
TOTAL PROTEIN: 6 g/dL — AB (ref 6.4–8.3)
Total Bilirubin: 0.8 mg/dL (ref 0.20–1.20)

## 2013-12-30 LAB — TSH CHCC: TSH: 0.778 m(IU)/L (ref 0.320–4.118)

## 2013-12-30 MED ORDER — SODIUM CHLORIDE 0.9 % IV SOLN
Freq: Once | INTRAVENOUS | Status: AC
  Administered 2013-12-30: 14:00:00 via INTRAVENOUS

## 2013-12-30 MED ORDER — SODIUM CHLORIDE 0.9 % IV SOLN
3.0000 mg/kg | Freq: Once | INTRAVENOUS | Status: AC
  Administered 2013-12-30: 230 mg via INTRAVENOUS
  Filled 2013-12-30: qty 23

## 2013-12-30 NOTE — Patient Instructions (Signed)
[facility] Discharge Instructions for Patients Receiving Chemotherapy  Today you received the following chemotherapy agents nivolumab   To help prevent nausea and vomiting after your treatment, we encourage you to take your nausea medication as directed   If you develop nausea and vomiting that is not controlled by your nausea medication, call the clinic.   BELOW ARE SYMPTOMS THAT SHOULD BE REPORTED IMMEDIATELY:  *FEVER GREATER THAN 100.5 F  *CHILLS WITH OR WITHOUT FEVER  NAUSEA AND VOMITING THAT IS NOT CONTROLLED WITH YOUR NAUSEA MEDICATION  *UNUSUAL SHORTNESS OF BREATH  *UNUSUAL BRUISING OR BLEEDING  TENDERNESS IN MOUTH AND THROAT WITH OR WITHOUT PRESENCE OF ULCERS  *URINARY PROBLEMS  *BOWEL PROBLEMS  UNUSUAL RASH Items with * indicate a potential emergency and should be followed up as soon as possible.  Feel free to call the clinic you have any questions or concerns. The clinic phone number is [phone].  

## 2013-12-30 NOTE — Progress Notes (Addendum)
[place] [phone]   [phone]  SHARED VISIT PROGRESS NOTE  Simona Huh, MD [address], [address] [place] [address]  DIAGNOSIS: metastatic malignant melanoma with lung, brain and bone metastasis. He is status post stereotactic radiotherapy to a solitary brain lesions in addition to palliative radiotherapy to the metastatic bone lesion in the left hip diagnosed in September of 2013 initially as early stage disease with evidence of metastasis in July 2014.  PRIOR THERAPY: 1) the patient underwent cervical lymph node dissection under the care of Dr. Freddi Che and the lymph nodes were positive for metastatic melanoma. Molecular studies showed negative BRAF/NRAS/KIT mutation but positive PIK3CA mutation ([id number]).  2) palliative course of external beam radiation completed 10/06/2012. 3) a course of palliative radiotherapy to the destructive bone lesion within the left iliac bone completed 06/30/2013 under the care of Dr. Lisbeth Renshaw. 4) Status post 1 month treatment with a Compazine discontinued secondary to intolerance   CURRENT THERAPY: Nivolumab 3 mg/Kg every 2 weeks status post 10 cycles. The first 7 cycles were given at [facility] under the care of Dr. Clance Boll.  INTERVAL HISTORY: Gabriel Wood 80 y.o. male returns to the clinic today for followup visit accompanied by his daughter Katharine Look.   He tolerated the last cycle of his immunotherapy fairly well with no significant adverse effects except for increased shortness of breath. He notes deterioration in his breathing and stamina.  His He sleeps in a recliner and has been doing so since February of 2014. He reports he is to see his cardiologist 01/03/2014. He denied having any significant fever or chills. He has no nausea or vomiting. He denied having any significant skin rash or diarrhea. He reports issues with his blood pressure medications and his atrial fibrillation. His primary  care physician has been adjusting his blood pressure medication. It has been a while since he see his cardiologist.The patient denied having any significant shortness of breath, cough or hemoptysis. He is here today to start cycle #11 of his immunotherapy.  MEDICAL HISTORY: Past Medical History  Diagnosis Date  . History of colon polyps   . Anemia   . Ulcer     history of Peptic Ulcer Disease  . IBS (irritable bowel syndrome)   . Anxiety   . BPH (benign prostatic hypertrophy)   . CAD (coronary artery disease)   . Atrial fibrillation   . AAA (abdominal aortic aneurysm)   . Chronic anticoagulation     On Coumadin therapy ( A Fib)  . Hypertension   . Hyperlipidemia   . History of DVT of lower extremity     pt does not recall this hx on 07/22/2013  . Anginal pain   . Shortness of breath   . Transient ischemic attack (TIA) 07/2012  . Fall at home Dec. 2014  . CHF (congestive heart failure)   . COPD (chronic obstructive pulmonary disease)   . History of radiation therapy 06/30/13 completed      5/5 srs brain  . Family history of anesthesia complication     "son goes bezerk" (07/22/2013)  . On home oxygen therapy     "2L; w/activity only recently & prn shortness of breath" (07/22/2013)  . H/O hiatal hernia   . Arthritis   . Kidney stone   . Melanoma Sept. 2013    ,Right  posterior shoulder  . Metastatic melanoma     [name] 07/22/2013  . Melanoma of neck  X 2  . Cancer     "somewhere in my abdomen" (07/22/2013)  . GERD (gastroesophageal reflux disease)   . Stroke 2015    "weakness on right side since"  . S/P radiation therapy 06/24/13-06/30/13    Left iliac 20Gy/41f    ALLERGIES:  is allergic to morphine and related and codeine.  MEDICATIONS:  Current Outpatient Prescriptions  Medication Sig Dispense Refill  . acetaminophen (TYLENOL) 650 MG CR tablet Take 1,300 mg by mouth every 8 (eight) hours as needed for pain.      .Marland Kitchenalbuterol (PROVENTIL HFA;VENTOLIN HFA) 108 (90 BASE)  MCG/ACT inhaler Inhale 2 puffs into the lungs every 6 (six) hours as needed for wheezing or shortness of breath.      .Marland Kitchenaspirin EC 81 MG tablet Take 81 mg by mouth daily.      .Marland Kitchenatorvastatin (LIPITOR) 40 MG tablet Take 40 mg by mouth daily.       . budesonide-formoterol (SYMBICORT) 160-4.5 MCG/ACT inhaler Inhale 2 puffs into the lungs 2 (two) times daily.      . citalopram (CELEXA) 10 MG tablet Take 10 mg by mouth daily.       .Marland Kitchendocusate sodium (COLACE) 100 MG capsule Take 100 mg by mouth at bedtime.      . famotidine (PEPCID) 20 MG tablet Take 20 mg by mouth daily.      . furosemide (LASIX) 20 MG tablet Take 20 mg by mouth.      . meclizine (ANTIVERT) 25 MG tablet Take 25 mg by mouth daily.       . Melatonin 3 MG TABS Take 3 mg by mouth 1 day or 1 dose.      . metoprolol tartrate (LOPRESSOR) 25 MG tablet Take 25 mg by mouth 1 day or 1 dose.       . Multiple Vitamin (MULTIVITAMIN WITH MINERALS) TABS Take 1 tablet by mouth every evening.      . nitroGLYCERIN (NITROSTAT) 0.4 MG SL tablet Place 0.4 mg under the tongue every 5 (five) minutes as needed for chest pain.      . OXYGEN-HELIUM IN daily as needed. Frequency:PHARMDIR   Dosage:0.0     Instructions:  Note:Diagnosis: COPD HT:5'9 WT: 89KG Home O2 @ 2LPM via [place], as needed to keep O2 >92%. Concentrator, supplies, & Portable tank for transport. Sats on RA @ rest      . predniSONE (DELTASONE) 5 MG tablet Take 5 mg by mouth daily.      .Marland Kitchensenna-docusate (RA SENNA PLUS) 8.6-50 MG per tablet 1 tablet daily as needed.      . potassium chloride SA (K-DUR,KLOR-CON) 20 MEQ tablet Take 40 mEq by mouth.       No current facility-administered medications for this visit.    SURGICAL HISTORY:  Past Surgical History  Procedure Laterality Date  . Appendectomy  1957  . Cholecystectomy  1973  . Shoulder open rotator cuff repair Bilateral     "twice on the right; once on the left"  . Coronary artery bypass graft  1999    CABG X4  . Tibia fracture surgery  Left 2011    tibial ORIF by Dr. DSharol Given . Ankle fusion Left 2011  . Lymph node biopsy    . Total shoulder replacement Left Dec.  2014  . Kidney stone surgery      "once"  . Joint replacement    . Cystoscopy w/ stone manipulation      "3-4 times"  .  Tonsillectomy  1957  . Melanoma excision Right 05/2011; ~ 12/2011; 2014    shoulder;  neck; neck    REVIEW OF SYSTEMS:  A comprehensive review of systems was negative except for: Constitutional: positive for fatigue Respiratory: positive for dyspnea on exertion Cardiovascular: positive for irregular heart beat and Occasional chest discomfort related to his atrial fibrillation   PHYSICAL EXAMINATION: General appearance: alert, cooperative and no distress Head: Normocephalic, without obvious abnormality, atraumatic Neck: no adenopathy, no JVD, supple, symmetrical, trachea midline and thyroid not enlarged, symmetric, no tenderness/mass/nodules Lymph nodes: Cervical, supraclavicular, and axillary nodes normal. Resp: clear to auscultation bilaterally Back: symmetric, no curvature. ROM normal. No CVA tenderness. Cardio: regular rate and rhythm, S1, S2 normal, no murmur, click, rub or gallop GI: soft, non-tender; bowel sounds normal; no masses,  no organomegaly Extremities: extremities normal, atraumatic, no cyanosis or edema  ECOG PERFORMANCE STATUS: 2 - Symptomatic, <50% confined to bed  Blood pressure 107/46, pulse 74, temperature 97.8 F (36.6 C), temperature source Oral, resp. rate 18, height 5' 3"  (1.6 m), weight 171 lb 12.8 oz (77.928 kg).  LABORATORY DATA: Lab Results  Component Value Date   WBC 5.6 12/30/2013   HGB 10.7* 12/30/2013   HCT 34.5* 12/30/2013   MCV 77.9* 12/30/2013   PLT 144 12/30/2013      Chemistry      Component Value Date/Time   NA 139 12/30/2013 1156   NA 140 07/23/2013 0102   K 4.0 12/30/2013 1156   K 4.1 07/23/2013 0102   CL 104 07/23/2013 0102   CO2 26 12/30/2013 1156   CO2 25 07/23/2013 0102   BUN 18.2 12/30/2013  1156   BUN 15 07/23/2013 0102   CREATININE 1.1 12/30/2013 1156   CREATININE 0.82 07/23/2013 0102   CREATININE 1.19 03/06/2011 0945      Component Value Date/Time   CALCIUM 9.8 12/30/2013 1156   CALCIUM 8.9 07/23/2013 0102   ALKPHOS 77 12/30/2013 1156   ALKPHOS 76 07/22/2013 1045   AST 13 12/30/2013 1156   AST 24 07/22/2013 1045   ALT 11 12/30/2013 1156   ALT 38 07/22/2013 1045   BILITOT 0.80 12/30/2013 1156   BILITOT 0.5 07/22/2013 1045       RADIOGRAPHIC STUDIES: No results found.  ASSESSMENT AND PLAN: This is a very pleasant 80 years old white male with history of metastatic malignant melanoma currently on treatment with immunotherapy with Nivolumab status post 9 cycles.  The patient is tolerating his treatment fairly well with no significant adverse effects. Patient was discussed with them and seen by Dr. Julien Nordmann. He may receive the flu vaccine (injection) when he returns for follow up in 2 weeks.  He will proceed with cycle #11 today as scheduled. He will followup in 2 weeks prior to cycle #12 with restaging CT scan of the chest, abdomen and pelvis with contrast to re-evaluate his disease. Regarding his issues with his blood pressure medications and atrial fibrillation, increased fatigue and dyspnea,the patient was advised to see his cardiologist. Both patient and his daughter voiced understanding. He was advised to call immediately if he has any concerning symptoms in the interval. The patient voices understanding of current disease status and treatment options and is in agreement with the current care plan.  All questions were answered. The patient knows to call the clinic with any problems, questions or concerns. We can certainly see the patient much sooner if necessary.  Disclaimer: This note was dictated with voice recognition software. Similar sounding words  can inadvertently be transcribed and may not be corrected upon review. Carlton Adam,  PA-C 12/30/2013  ADDENDUM: Hematology/Oncology Attending:  I had a face to face encounter with the patient. I recommended to his care plan. This is a very pleasant 80 years old white male with metastatic malignant melanoma currently undergoing immunotherapy with single agent Nivolumab status post 10 cycles and he is tolerating his treatment fairly well with no significant adverse effects. The patient has some issues with his blood pressure medication as well as atrial fibrillation causing increasing fatigue and dyspnea. I advise him to consult with his cardiologist regarding adjusting his medication. We'll proceed with cycle #11 of his immunotherapy today as scheduled. The patient would come back for followup visit in 2 weeks after repeating CT scan of the chest, abdomen and pelvis for restaging of his disease. He was advised to call immediately if he has any concerning symptoms in the interval. Eilleen Kempf., MD 01/03/2014

## 2013-12-30 NOTE — Telephone Encounter (Signed)
pt was not home....mailed pt appt sched/avs and letter

## 2013-12-31 ENCOUNTER — Emergency Department (HOSPITAL_COMMUNITY): Payer: Medicare Other

## 2013-12-31 ENCOUNTER — Emergency Department (HOSPITAL_COMMUNITY)
Admission: EM | Admit: 2013-12-31 | Discharge: 2013-12-31 | Disposition: A | Discharge status: Home / Self Care | Payer: Medicare Other | Attending: Emergency Medicine | Admitting: Emergency Medicine

## 2013-12-31 ENCOUNTER — Encounter (HOSPITAL_COMMUNITY): Payer: Self-pay | Admitting: Emergency Medicine

## 2013-12-31 DIAGNOSIS — Z87891 Personal history of nicotine dependence: Secondary | ICD-10-CM | POA: Insufficient documentation

## 2013-12-31 DIAGNOSIS — IMO0002 Reserved for concepts with insufficient information to code with codable children: Secondary | ICD-10-CM | POA: Diagnosis not present

## 2013-12-31 DIAGNOSIS — Z86718 Personal history of other venous thrombosis and embolism: Secondary | ICD-10-CM | POA: Insufficient documentation

## 2013-12-31 DIAGNOSIS — Z860100 Personal history of colon polyps, unspecified: Secondary | ICD-10-CM | POA: Insufficient documentation

## 2013-12-31 DIAGNOSIS — Z923 Personal history of irradiation: Secondary | ICD-10-CM | POA: Diagnosis not present

## 2013-12-31 DIAGNOSIS — Z9181 History of falling: Secondary | ICD-10-CM | POA: Diagnosis not present

## 2013-12-31 DIAGNOSIS — M129 Arthropathy, unspecified: Secondary | ICD-10-CM | POA: Diagnosis not present

## 2013-12-31 DIAGNOSIS — Z862 Personal history of diseases of the blood and blood-forming organs and certain disorders involving the immune mechanism: Secondary | ICD-10-CM | POA: Diagnosis not present

## 2013-12-31 DIAGNOSIS — R0602 Shortness of breath: Secondary | ICD-10-CM | POA: Insufficient documentation

## 2013-12-31 DIAGNOSIS — Z87448 Personal history of other diseases of urinary system: Secondary | ICD-10-CM | POA: Insufficient documentation

## 2013-12-31 DIAGNOSIS — I209 Angina pectoris, unspecified: Secondary | ICD-10-CM | POA: Diagnosis not present

## 2013-12-31 DIAGNOSIS — Z87442 Personal history of urinary calculi: Secondary | ICD-10-CM | POA: Insufficient documentation

## 2013-12-31 DIAGNOSIS — Z8711 Personal history of peptic ulcer disease: Secondary | ICD-10-CM | POA: Insufficient documentation

## 2013-12-31 DIAGNOSIS — Z8673 Personal history of transient ischemic attack (TIA), and cerebral infarction without residual deficits: Secondary | ICD-10-CM | POA: Insufficient documentation

## 2013-12-31 DIAGNOSIS — E785 Hyperlipidemia, unspecified: Secondary | ICD-10-CM | POA: Insufficient documentation

## 2013-12-31 DIAGNOSIS — Z7982 Long term (current) use of aspirin: Secondary | ICD-10-CM | POA: Insufficient documentation

## 2013-12-31 DIAGNOSIS — Z7901 Long term (current) use of anticoagulants: Secondary | ICD-10-CM | POA: Diagnosis not present

## 2013-12-31 DIAGNOSIS — J441 Chronic obstructive pulmonary disease with (acute) exacerbation: Secondary | ICD-10-CM | POA: Insufficient documentation

## 2013-12-31 DIAGNOSIS — Z85820 Personal history of malignant melanoma of skin: Secondary | ICD-10-CM | POA: Insufficient documentation

## 2013-12-31 DIAGNOSIS — Z79899 Other long term (current) drug therapy: Secondary | ICD-10-CM | POA: Diagnosis not present

## 2013-12-31 DIAGNOSIS — I4891 Unspecified atrial fibrillation: Secondary | ICD-10-CM | POA: Diagnosis not present

## 2013-12-31 DIAGNOSIS — Z9981 Dependence on supplemental oxygen: Secondary | ICD-10-CM | POA: Diagnosis not present

## 2013-12-31 DIAGNOSIS — I509 Heart failure, unspecified: Secondary | ICD-10-CM | POA: Insufficient documentation

## 2013-12-31 DIAGNOSIS — Z951 Presence of aortocoronary bypass graft: Secondary | ICD-10-CM | POA: Insufficient documentation

## 2013-12-31 DIAGNOSIS — F411 Generalized anxiety disorder: Secondary | ICD-10-CM | POA: Diagnosis not present

## 2013-12-31 LAB — PRO B NATRIURETIC PEPTIDE: Pro B Natriuretic peptide (BNP): 970.5 pg/mL — ABNORMAL HIGH (ref 0–450)

## 2013-12-31 LAB — COMPREHENSIVE METABOLIC PANEL
ALT: 14 U/L (ref 0–53)
ANION GAP: 10 (ref 5–15)
AST: 16 U/L (ref 0–37)
Albumin: 3.7 g/dL (ref 3.5–5.2)
Alkaline Phosphatase: 82 U/L (ref 39–117)
BUN: 17 mg/dL (ref 6–23)
CALCIUM: 10.2 mg/dL (ref 8.4–10.5)
CO2: 27 mEq/L (ref 19–32)
Chloride: 100 mEq/L (ref 96–112)
Creatinine, Ser: 1.1 mg/dL (ref 0.50–1.35)
GFR calc Af Amer: 68 mL/min — ABNORMAL LOW (ref >90)
GFR calc non Af Amer: 58 mL/min — ABNORMAL LOW (ref >90)
Glucose, Bld: 99 mg/dL (ref 70–99)
Potassium: 4.1 mEq/L (ref 3.7–5.3)
SODIUM: 137 meq/L (ref 137–147)
Total Bilirubin: 1 mg/dL (ref 0.3–1.2)
Total Protein: 6.6 g/dL (ref 6.0–8.3)

## 2013-12-31 LAB — URINALYSIS, ROUTINE W REFLEX MICROSCOPIC
Bilirubin Urine: NEGATIVE
Glucose, UA: NEGATIVE mg/dL
Hgb urine dipstick: NEGATIVE
KETONES UR: NEGATIVE mg/dL
LEUKOCYTES UA: NEGATIVE
NITRITE: NEGATIVE
PH: 7.5 (ref 5.0–8.0)
PROTEIN: NEGATIVE mg/dL
Specific Gravity, Urine: 1.01 (ref 1.005–1.030)
UROBILINOGEN UA: 0.2 mg/dL (ref 0.0–1.0)

## 2013-12-31 LAB — CBC WITH DIFFERENTIAL/PLATELET
Basophils Absolute: 0 10*3/uL (ref 0.0–0.1)
Basophils Relative: 0 % (ref 0–1)
Eosinophils Absolute: 0.2 10*3/uL (ref 0.0–0.7)
Eosinophils Relative: 3 % (ref 0–5)
HCT: 36.1 % — ABNORMAL LOW (ref 39.0–52.0)
Hemoglobin: 11.1 g/dL — ABNORMAL LOW (ref 13.0–17.0)
LYMPHS ABS: 0.7 10*3/uL (ref 0.7–4.0)
Lymphocytes Relative: 11 % — ABNORMAL LOW (ref 12–46)
MCH: 24 pg — ABNORMAL LOW (ref 26.0–34.0)
MCHC: 30.7 g/dL (ref 30.0–36.0)
MCV: 78 fL (ref 78.0–100.0)
Monocytes Absolute: 1.2 10*3/uL — ABNORMAL HIGH (ref 0.1–1.0)
Monocytes Relative: 17 % — ABNORMAL HIGH (ref 3–12)
Neutro Abs: 4.6 10*3/uL (ref 1.7–7.7)
Neutrophils Relative %: 69 % (ref 43–77)
PLATELETS: 149 10*3/uL — AB (ref 150–400)
RBC: 4.63 MIL/uL (ref 4.22–5.81)
RDW: 17.8 % — AB (ref 11.5–15.5)
WBC: 6.7 10*3/uL (ref 4.0–10.5)

## 2013-12-31 LAB — TROPONIN I: Troponin I: <0.3 ng/mL (ref <0.30)

## 2013-12-31 MED ORDER — LORAZEPAM 2 MG/ML IJ SOLN
0.5000 mg | Freq: Once | INTRAMUSCULAR | Status: AC
  Administered 2013-12-31: 0.5 mg via INTRAVENOUS
  Filled 2013-12-31: qty 1

## 2013-12-31 MED ORDER — IOHEXOL 350 MG/ML SOLN
80.0000 mL | Freq: Once | INTRAVENOUS | Status: AC | PRN
  Administered 2013-12-31: 80 mL via INTRAVENOUS

## 2013-12-31 MED ORDER — IPRATROPIUM-ALBUTEROL 0.5-2.5 (3) MG/3ML IN SOLN
3.0000 mL | Freq: Once | RESPIRATORY_TRACT | Status: AC
  Administered 2013-12-31: 3 mL via RESPIRATORY_TRACT
  Filled 2013-12-31: qty 3

## 2013-12-31 NOTE — ED Provider Notes (Signed)
80 y.o.male with multiple medical problems including a fib and metastatic melanoma presents today complaining of feeling jittery.  Patient assessed here with lab, radiograph including ct angio and ekg with all appearing stable.  Dr. Berkley Harvey discussed with Dr. Wynonia Lawman and follow up arranged.  Patient given return precautions and voices understanding of plan.  Date: 12/31/2013  Rate: 75  Rhythm: atrial fibrillation  QRS Axis: normal  Intervals: normal  ST/T Wave abnormalities: nonspecific ST/T changes  Conduction Disutrbances:none  Narrative Interpretation:   Old EKG Reviewed: changes noted New t wave inversion lead 3 and avF  I saw and evaluated the patient, reviewed the resident's note and I agree with the findings and plan.   Shaune Pollack, MD 12/31/13 [phone]

## 2013-12-31 NOTE — ED Provider Notes (Signed)
CSN: [id number]     Arrival date & time 12/31/13  3016 History   First MD Initiated Contact with Patient 12/31/13 [phone]     Chief Complaint  Patient presents with  . Shortness of Breath     (Consider location/radiation/quality/duration/timing/severity/associated sxs/prior Treatment) HPI Comments: He reports his "afib has been bothering him since last night"; feeling jittery, anxious with racing heart.  He is currently undergoing chemotherapy for metastatic melanoma and received treatment yesterday with Nivolumab.  He reports worsening shortness of breath between periods of treatment.  Has a history of atrial fibrillation previously treated with Coumadin then switched over to Pradaxa which was discontinued approximately 6 months ago. Hx of DVT/PE per chart which he does not recall.  Denies hx of MI but has had CABG. Chronic anemia. HFpEF 55% with diuretic as needed (last used > 3 weeks ago). COPD on oxygen as needed which he has been using more but not continuously. Denies melena or hematochezia.   Patient is a 80 y.o. male presenting with shortness of breath. The history is provided by the patient and a relative.  Shortness of Breath Severity:  Moderate Onset quality:  Sudden Duration:  12 hours Timing:  Constant Progression:  Unchanged Context: activity   Context: not URI   Relieved by:  Oxygen and rest Worsened by:  Activity and exertion Ineffective treatments:  None tried Associated symptoms: no abdominal pain, no chest pain, no claudication, no cough, no diaphoresis, no fever, no headaches, no hemoptysis, no sputum production, no syncope, no vomiting and no wheezing   Risk factors: hx of cancer and hx of PE/DVT   Risk factors: no recent surgery     Past Medical History  Diagnosis Date  . History of colon polyps   . Anemia   . Ulcer     history of Peptic Ulcer Disease  . IBS (irritable bowel syndrome)   . Anxiety   . BPH (benign prostatic hypertrophy)   . CAD (coronary artery  disease)   . Atrial fibrillation   . AAA (abdominal aortic aneurysm)   . Chronic anticoagulation     On Coumadin therapy ( A Fib)  . Hypertension   . Hyperlipidemia   . History of DVT of lower extremity     pt does not recall this hx on 07/22/2013  . Anginal pain   . Shortness of breath   . Transient ischemic attack (TIA) 07/2012  . Fall at home Dec. 2014  . CHF (congestive heart failure)   . COPD (chronic obstructive pulmonary disease)   . History of radiation therapy 06/30/13 completed      5/5 srs brain  . Family history of anesthesia complication     "son goes bezerk" (07/22/2013)  . On home oxygen therapy     "2L; w/activity only recently & prn shortness of breath" (07/22/2013)  . H/O hiatal hernia   . Arthritis   . Kidney stone   . Melanoma Sept. 2013    ,Right  posterior shoulder  . Metastatic melanoma     [name] 07/22/2013  . Melanoma of neck     X 2  . Cancer     "somewhere in my abdomen" (07/22/2013)  . GERD (gastroesophageal reflux disease)   . Stroke 2015    "weakness on right side since"  . S/P radiation therapy 06/24/13-06/30/13    Left iliac 20Gy/47fx   Past Surgical History  Procedure Laterality Date  . Appendectomy  1957  . Cholecystectomy  1973  .  Shoulder open rotator cuff repair Bilateral     "twice on the right; once on the left"  . Coronary artery bypass graft  1999    CABG X4  . Tibia fracture surgery Left 2011    tibial ORIF by Dr. Sharol Given  . Ankle fusion Left 2011  . Lymph node biopsy    . Total shoulder replacement Left Dec.  2014  . Kidney stone surgery      "once"  . Joint replacement    . Cystoscopy w/ stone manipulation      "3-4 times"  . Tonsillectomy  1957  . Melanoma excision Right 05/2011; ~ 12/2011; 2014    shoulder;  neck; neck   Family History  Problem Relation Age of Onset  . Cancer Mother     stomach cancer  . Heart disease Brother   . Heart disease Brother   . Heart disease Brother    History  Substance Use Topics  .  Smoking status: Former Smoker -- 1.00 packs/day for 55 years    Types: Cigarettes    Quit date: 04/08/1996  . Smokeless tobacco: Never Used  . 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 Negative for fever, chills and diaphoresis.  HENT: Negative.   Respiratory: Positive for shortness of breath. Negative for cough, hemoptysis, sputum production, chest tightness and wheezing.   Cardiovascular: Positive for palpitations and leg swelling. Negative for chest pain, claudication and syncope.  Gastrointestinal: Negative for nausea, vomiting, abdominal pain, diarrhea and blood in stool.  Genitourinary: Negative for dysuria.  Neurological: Positive for weakness. Negative for dizziness, light-headedness, numbness and headaches.      Allergies  Morphine and related and Codeine  Home Medications   Prior to Admission medications   Medication Sig Start Date End Date Taking? Authorizing Provider  acetaminophen (TYLENOL) 650 MG CR tablet Take 1,300 mg by mouth every 8 (eight) hours as needed for pain.   Yes Historical Provider, MD  albuterol (PROVENTIL HFA;VENTOLIN HFA) 108 (90 BASE) MCG/ACT inhaler Inhale 2 puffs into the lungs every 6 (six) hours as needed for wheezing or shortness of breath.   Yes Historical Provider, MD  aspirin EC 81 MG tablet Take 81 mg by mouth daily.   Yes Historical Provider, MD  atorvastatin (LIPITOR) 40 MG tablet Take 40 mg by mouth at bedtime.  02/12/11  Yes Historical Provider, MD  budesonide-formoterol (SYMBICORT) 160-4.5 MCG/ACT inhaler Inhale 2 puffs into the lungs 2 (two) times daily.   Yes Historical Provider, MD  citalopram (CELEXA) 10 MG tablet Take 10 mg by mouth daily.  03/07/11  Yes Historical Provider, MD  famotidine (PEPCID) 20 MG tablet Take 20 mg by mouth daily.   Yes Historical Provider, MD  meclizine (ANTIVERT) 25 MG tablet Take 25  mg by mouth daily.    Yes Historical Provider, MD  Melatonin 3 MG TABS Take 3 mg by mouth at bedtime.    Yes Historical Provider, MD  metoprolol tartrate (LOPRESSOR) 25 MG tablet Take 25 mg by mouth daily.    Yes Historical Provider, MD  Multiple Vitamin (MULTIVITAMIN WITH MINERALS) TABS Take 1 tablet by mouth every evening.   Yes Historical Provider, MD  nitroGLYCERIN (NITROSTAT) 0.4 MG SL tablet Place 0.4 mg under the tongue every 5 (five) minutes as needed for chest pain.   Yes Historical Provider, MD  OXYGEN-HELIUM IN Inhale 2 % into the lungs daily. daily as needed. Frequency:PHARMDIR   Dosage:0.0     Instructions:  Note:Diagnosis: COPD HT:5'9 WT: 89KG Home O2 @ 2LPM via [place], as needed to keep O2 >92%. Concentrator, supplies, & Portable tank for transport. Sats on RA @ rest 06/09/12  Yes Historical Provider, MD  potassium chloride SA (K-DUR,KLOR-CON) 20 MEQ tablet Take 20 mEq by mouth 2 (two) times daily.  08/07/13 08/07/14 Yes Historical Provider, MD  predniSONE (DELTASONE) 5 MG tablet Take 5 mg by mouth daily. 10/28/13  Yes Historical Provider, MD  senna-docusate (RA SENNA PLUS) 8.6-50 MG per tablet Take 1 tablet by mouth at bedtime.  08/30/13  Yes Historical Provider, MD   BP 126/80  Pulse 83  Temp(Src) 98.6 F (37 C) (Oral)  Resp 21  SpO2 97% Physical Exam  Constitutional: He is oriented to person, place, and time. No distress.  Elderly male   Eyes: EOM are normal. Pupils are equal, round, and reactive to light.  Cardiovascular: Normal heart sounds and intact distal pulses.   No murmur heard. Irregularly irregular rhythm without RVR; LE edema 2+  Pulmonary/Chest: No respiratory distress. He has wheezes. He has rales.  Tachypnea  Abdominal: Soft. He exhibits no distension. There is no tenderness. There is no guarding.  Neurological: He is alert and oriented to person, place, and time.  Skin: He is not diaphoretic.    ED Course  Procedures (including critical care time) Labs  Review Labs Reviewed  CBC WITH DIFFERENTIAL - Abnormal; Notable for the following:    Hemoglobin 11.1 (*)    HCT 36.1 (*)    MCH 24.0 (*)    RDW 17.8 (*)    Platelets 149 (*)    Lymphocytes Relative 11 (*)    Monocytes Relative 17 (*)    Monocytes Absolute 1.2 (*)    All other components within normal limits  COMPREHENSIVE METABOLIC PANEL - Abnormal; Notable for the following:    GFR calc non Af Amer 58 (*)    GFR calc Af Amer 68 (*)    All other components within normal limits  PRO B NATRIURETIC PEPTIDE - Abnormal; Notable for the following:    Pro B Natriuretic peptide (BNP) 970.5 (*)    All other components within normal limits  TROPONIN I  URINALYSIS, ROUTINE W REFLEX MICROSCOPIC    Imaging Review Ct Angio Chest W/cm &/or Wo Cm  12/31/2013   CLINICAL DATA:  Short of breath.  EXAM: CT ANGIOGRAPHY CHEST WITH CONTRAST  TECHNIQUE: Multidetector CT imaging of the chest was performed using the standard protocol during bolus administration of intravenous contrast. Multiplanar CT image reconstructions and MIPs were obtained to evaluate the vascular anatomy.  CONTRAST:  58mL OMNIPAQUE IOHEXOL 350 MG/ML SOLN  COMPARISON:  Chest radiograph, 12/31/2013.  Chest CT, 07/22/2013.  FINDINGS: No evidence of a pulmonary embolus.  There are changes from previous CABG surgery. Cardiac silhouette is mildly enlarged. There are dense coronary artery calcifications.  Great vessels are normal in caliber.  No aortic dissection.  Mildly prominent mediastinal lymph nodes. Largest is a precarinal node measuring 12 mm short axis. These are stable. No neck base or axillary masses or adenopathy. No mediastinal masses. No hilar masses or pathologically enlarged lymph nodes.  Irregular thick walled cystic/cavitary lesions are noted in the peripheral right lower lobe, without change from the prior study. There other areas of paraseptal emphysema, also stable. Linear and coarse reticular opacity is noted in both lungs  predominantly in the lower lobes dependently, greater on the right, likely combination scarring atelectasis. No lung consolidation or convincing edema. No pleural effusion or pneumothorax.  Limited evaluation of the upper abdomen shows a well-defined low-density liver lesion in the left lobe measuring 3.5 cm, consistent with a cyst. There are splenic calcifications consistent with healed granuloma. Patient has had a cholecystectomy.  Stable mild compression fracture of T12. No other fractures. No osteoblastic or osteolytic lesions.  Review of the MIP images confirms the above findings.  IMPRESSION: 1. No evidence of a pulmonary embolism. 2. Chronic abnormalities in the lungs, most evident in the right lower lobe. There are peripheral thick-walled cystic/cavitary areas in the peripheral right lower lobe. There other areas of paraseptal emphysema in both lungs as well as areas of lung scarring atelectasis. No convincing acute pneumonia and no pulmonary edema. 3. Cardiomegaly and dense coronary artery calcifications.   Electronically Signed   By: Lajean Manes M.D.   On: 12/31/2013 10:52   Dg Chest Portable 1 View  12/31/2013   CLINICAL DATA:  80 year old male with shortness of breath. History of CABG.  EXAM: PORTABLE CHEST - 1 VIEW  COMPARISON:  07/22/2013 and prior radiographs  FINDINGS: The cardiomediastinal silhouette is stable with evidence of CABG again noted.  Pulmonary vascular congestion is present.  Elevation of the right hemidiaphragm and right basilar atelectasis/scarring again noted.  There is no evidence of focal airspace disease, pulmonary edema, suspicious pulmonary nodule/mass, pleural effusion, or pneumothorax. No acute bony abnormalities are identified.  Left shoulder hemiarthroplasty again noted. Surgical clips overlying the upper right chest/ lower neck are noted.  IMPRESSION: Pulmonary vascular congestion without evidence of other acute abnormality.   Electronically Signed   By: Hassan Rowan M.D.    On: 12/31/2013 09:11     EKG Interpretation None      MDM   Final diagnoses:  SOB (shortness of breath)   Patient with metastatic melanoma presents with worsening SOB. On home oxygen as needed with known chronic afib current in rate controlled afeb. Chest xray negative for worsening heart failure or PNA. Denies worsening cough or sputum production concerning for COPD exacerbation. EKG unchanged and troponin negative; discussed with his cardiologist Dr Wynonia Lawman who already has apt in 3 days and agrees with discharge and follow-up as scheduled. CT angio ordered due to patient Hx of PE and current CA not on coagulation for Afib which was negative. Patient SOB improved with [place] O2 @ 2L. Patient advised to continue Nasal oxygen continuously and follow-up with Cardiologist on Monday as scheduled.     Olam Idler, MD 12/31/13 1149

## 2013-12-31 NOTE — ED Notes (Addendum)
Pt c/o increasing SOB and "issues with my Atrial fibrillation" x a couple weeks.  Denies pain.  Pt reports SOB increases w/ walking and laying down.  Sts appointment w/ cardiologist next week.  Pt describes "issues" as "nervousness inside."  Hx of melanoma, CAD, COPD, HTN, and A fib.

## 2013-12-31 NOTE — Discharge Instructions (Signed)
We recommend wearing your oxygen at all times.    Shortness of Breath Shortness of breath means you have trouble breathing. Shortness of breath needs medical care right away. HOME CARE   Do not smoke.  Avoid being around chemicals or things (paint fumes, dust) that may bother your breathing.  Rest as needed. Slowly begin your normal activities.  Only take medicines as told by your doctor.  Keep all doctor visits as told. GET HELP RIGHT AWAY IF:   Your shortness of breath gets worse.  You feel lightheaded, pass out (faint), or have a cough that is not helped by medicine.  You cough up blood.  You have pain with breathing.  You have pain in your chest, arms, shoulders, or belly (abdomen).  You have a fever.  You cannot walk up stairs or exercise the way you normally do.  You do not get better in the time expected.  You have a hard time doing normal activities even with rest.  You have problems with your medicines.  You have any new symptoms. MAKE SURE YOU:  Understand these instructions.  Will watch your condition.  Will get help right away if you are not doing well or get worse. Document Released: 09/11/2007 Document Revised: 03/30/2013 Document Reviewed: 06/10/2011 [facility] Patient Information 2015 [place], [place]. This information is not intended to replace advice given to you by your health care provider. Make sure you discuss any questions you have with your health care provider.

## 2014-01-03 NOTE — Patient Instructions (Signed)
Keep your appointment with your cardiologist Follow up in 2 weeks with restaging CT scans of your chest, abdomen and pelvis to re-evaluate your disease

## 2014-01-04 ENCOUNTER — Ambulatory Visit
Admission: RE | Admit: 2014-01-04 | Discharge: 2014-01-04 | Disposition: A | Discharge status: Home / Self Care | Payer: Medicare Other | Source: Ambulatory Visit | Attending: Radiation Oncology | Admitting: Radiation Oncology

## 2014-01-04 DIAGNOSIS — C7931 Secondary malignant neoplasm of brain: Secondary | ICD-10-CM

## 2014-01-04 DIAGNOSIS — C7949 Secondary malignant neoplasm of other parts of nervous system: Principal | ICD-10-CM

## 2014-01-04 MED ORDER — GADOBENATE DIMEGLUMINE 529 MG/ML IV SOLN
16.0000 mL | Freq: Once | INTRAVENOUS | Status: AC | PRN
  Administered 2014-01-04: 16 mL via INTRAVENOUS

## 2014-01-05 ENCOUNTER — Ambulatory Visit
Admission: RE | Admit: 2014-01-05 | Discharge: 2014-01-05 | Disposition: A | Discharge status: Home / Self Care | Payer: Medicare Other | Source: Ambulatory Visit | Attending: Radiation Oncology | Admitting: Radiation Oncology

## 2014-01-05 ENCOUNTER — Encounter: Payer: Self-pay | Admitting: Radiation Oncology

## 2014-01-05 VITALS — BP 120/57 | HR 72 | Temp 97.6°F | Ht 63.0 in | Wt 169.0 lb

## 2014-01-05 DIAGNOSIS — C7931 Secondary malignant neoplasm of brain: Secondary | ICD-10-CM

## 2014-01-05 DIAGNOSIS — C7951 Secondary malignant neoplasm of bone: Secondary | ICD-10-CM

## 2014-01-05 DIAGNOSIS — C7952 Secondary malignant neoplasm of bone marrow: Principal | ICD-10-CM

## 2014-01-05 DIAGNOSIS — C7949 Secondary malignant neoplasm of other parts of nervous system: Secondary | ICD-10-CM

## 2014-01-05 HISTORY — DX: Personal history of irradiation: Z92.3

## 2014-01-05 NOTE — Progress Notes (Signed)
Mr. Jepsen here today for reassessment S/P SRS to brain.  He admits to dizziness when standing and ambulates with a cane.  He denies any change in fine motor movement and denies any vision changes.  He states that he is experienceing SOB x 4 weeks with increasing fatigue.

## 2014-01-05 NOTE — Progress Notes (Signed)
Radiation Oncology         [phone] ________________________________  Name: WESAM GEARHART MRN: [id number]  Date: 01/05/2014  DOB: [birth year]  Follow-Up Visit Note  CC: Simona Huh, MD  Gaynelle Arabian, MD  Diagnosis:      Secondary malignant neoplasm of bone and bone marrow   06/24/2013 Initial Diagnosis Secondary malignant neoplasm of bone and bone marrow   06/24/2013 - 06/30/2013 Radiation Therapy Palliative radiation treatment to the left iliac: 20 Gy in 5 fractions    Secondary malignant neoplasm of brain and spinal cord   06/23/2013 - 06/23/2013 Radiation Therapy Radiosurgery to left midbrain lesion: 14 Gy   06/29/2013 Initial Diagnosis Secondary malignant neoplasm of brain and spinal cord    Narrative:  The patient returns today for routine follow-up.  The patient indicates that he is not feeling very well overall. His primary complaint today is worsening shortness of breath. He is fine at rest but quickly he experiences dyspnea on minimal exertion. His oxygen saturation level was obtained today which was 99% while sitting. He denies any headaches. No nausea. He continues with systemic immunotherapy with Dr. Julien Nordmann. He had a recent brain MRI scan and he presents today to review this result.                              ALLERGIES:  is allergic to morphine and related and codeine.  Meds: Current Outpatient Prescriptions  Medication Sig Dispense Refill  . acetaminophen (TYLENOL) 650 MG CR tablet Take 1,300 mg by mouth every 8 (eight) hours as needed for pain.      Marland Kitchen albuterol (PROVENTIL HFA;VENTOLIN HFA) 108 (90 BASE) MCG/ACT inhaler Inhale 2 puffs into the lungs every 6 (six) hours as needed for wheezing or shortness of breath.      Marland Kitchen aspirin EC 81 MG tablet Take 81 mg by mouth daily.      Marland Kitchen atorvastatin (LIPITOR) 40 MG tablet Take 40 mg by mouth at bedtime.       . budesonide-formoterol (SYMBICORT) 160-4.5 MCG/ACT inhaler Inhale 2 puffs into the lungs 2 (two) times  daily.      . citalopram (CELEXA) 10 MG tablet Take 10 mg by mouth daily.       . famotidine (PEPCID) 20 MG tablet Take 20 mg by mouth daily.      . meclizine (ANTIVERT) 25 MG tablet Take 25 mg by mouth daily.       . Melatonin 3 MG TABS Take 3 mg by mouth at bedtime.       . metoprolol tartrate (LOPRESSOR) 25 MG tablet Take 25 mg by mouth daily.       . Multiple Vitamin (MULTIVITAMIN WITH MINERALS) TABS Take 1 tablet by mouth every evening.      . nitroGLYCERIN (NITROSTAT) 0.4 MG SL tablet Place 0.4 mg under the tongue every 5 (five) minutes as needed for chest pain.      . OXYGEN-HELIUM IN Inhale 2 % into the lungs daily. daily as needed. Frequency:PHARMDIR   Dosage:0.0     Instructions:  Note:Diagnosis: COPD HT:5'9 WT: 89KG Home O2 @ 2LPM via [place], as needed to keep O2 >92%. Concentrator, supplies, & Portable tank for transport. Sats on RA @ rest      . potassium chloride SA (K-DUR,KLOR-CON) 20 MEQ tablet Take 20 mEq by mouth 2 (two) times daily.       . predniSONE (DELTASONE) 5 MG tablet Take  5 mg by mouth daily.      Marland Kitchen senna-docusate (RA SENNA PLUS) 8.6-50 MG per tablet Take 1 tablet by mouth at bedtime.        No current facility-administered medications for this encounter.    Physical Findings: The patient is in no acute distress. Patient is alert and oriented.  height is 5\' 3"  (1.6 m) and weight is 169 lb (76.658 kg). His temperature is 97.6 F (36.4 C). His blood pressure is 120/57 and his pulse is 72. His oxygen saturation is 99%. .   General: Well-developed, in no acute distress HEENT: Normocephalic, atraumatic Cardiovascular: Regular rate and rhythm Respiratory: Clear to auscultation bilaterally GI: Soft, nontender, normal bowel sounds Extremities: No edema present   Lab Findings: Lab Results  Component Value Date   WBC 6.7 12/31/2013   HGB 11.1* 12/31/2013   HCT 36.1* 12/31/2013   MCV 78.0 12/31/2013   PLT 149* 12/31/2013     Radiographic Findings: Ct Angio Chest W/cm  &/or Wo Cm  12/31/2013   CLINICAL DATA:  Short of breath.  EXAM: CT ANGIOGRAPHY CHEST WITH CONTRAST  TECHNIQUE: Multidetector CT imaging of the chest was performed using the standard protocol during bolus administration of intravenous contrast. Multiplanar CT image reconstructions and MIPs were obtained to evaluate the vascular anatomy.  CONTRAST:  82mL OMNIPAQUE IOHEXOL 350 MG/ML SOLN  COMPARISON:  Chest radiograph, 12/31/2013.  Chest CT, 07/22/2013.  FINDINGS: No evidence of a pulmonary embolus.  There are changes from previous CABG surgery. Cardiac silhouette is mildly enlarged. There are dense coronary artery calcifications.  Great vessels are normal in caliber.  No aortic dissection.  Mildly prominent mediastinal lymph nodes. Largest is a precarinal node measuring 12 mm short axis. These are stable. No neck base or axillary masses or adenopathy. No mediastinal masses. No hilar masses or pathologically enlarged lymph nodes.  Irregular thick walled cystic/cavitary lesions are noted in the peripheral right lower lobe, without change from the prior study. There other areas of paraseptal emphysema, also stable. Linear and coarse reticular opacity is noted in both lungs predominantly in the lower lobes dependently, greater on the right, likely combination scarring atelectasis. No lung consolidation or convincing edema. No pleural effusion or pneumothorax.  Limited evaluation of the upper abdomen shows a well-defined low-density liver lesion in the left lobe measuring 3.5 cm, consistent with a cyst. There are splenic calcifications consistent with healed granuloma. Patient has had a cholecystectomy.  Stable mild compression fracture of T12. No other fractures. No osteoblastic or osteolytic lesions.  Review of the MIP images confirms the above findings.  IMPRESSION: 1. No evidence of a pulmonary embolism. 2. Chronic abnormalities in the lungs, most evident in the right lower lobe. There are peripheral thick-walled  cystic/cavitary areas in the peripheral right lower lobe. There other areas of paraseptal emphysema in both lungs as well as areas of lung scarring atelectasis. No convincing acute pneumonia and no pulmonary edema. 3. Cardiomegaly and dense coronary artery calcifications.   Electronically Signed   By: Lajean Manes M.D.   On: 12/31/2013 10:52   [name] ZJ Contrast  01/04/2014   CLINICAL DATA:  Melanoma.  SRS six-month follow-up.  EXAM: MRI HEAD WITHOUT AND WITH CONTRAST  TECHNIQUE: Multiplanar, multiecho pulse sequences of the brain and surrounding structures were obtained without and with intravenous contrast.  CONTRAST:  89mL MULTIHANCE GADOBENATE DIMEGLUMINE 529 MG/ML IV SOLN  COMPARISON:  09/28/2013 most recent.  Also 06/12/2013.  FINDINGS: The study is incomplete. No  postcontrast T1 enhanced images were obtained. The patient was given contrast, and coronal T2 images were obtained, but these are not helpful in lesion detection. The patient had difficulty breathing and could not continue despite best attempts of the technologist. Several images are motion degraded.  No acute stroke or hemorrhage. Hydrocephalus ex vacuo. No extra-axial fluid. Generalized cerebral and cerebellar atrophy. Chronic microvascular ischemic change of a moderate degree. LEFT cavernous ICA stenosis and RIGHT distal vertebral occlusion incompletely evaluated, probably stable.  On image 16 series 6 there is a subcentimeter area of susceptibility T2* effect in the LEFT occipital lobe which is stable from April 2014 when technique (1.5T) differences are considered. This was observed on the prior [place] studies from March in June to not enhance, and is felt to represent an unrelated microbleed.  Comparison with the previous exam is made on the basis of axial precontrast T1 and T2 images. Mild facilitated diffusion centrally noted again within the LEFT upper pons lesion. No significant T1 shortening or susceptibility signal within the  melanoma metastasis. The lesion today measures approximately 5 x 7 x 6 mm, which appears to represent a significant improvement. Within limits of evaluation on noncontrast exam, no new lesions are evident.  IMPRESSION: No postcontrast T1 weighted images could be obtained due to the patient's clinical condition today.  Comparison with the previous study in June on the basis of the noncontrast images today shows apparent significant improvement in the LEFT upper pons lesion, and marked improvement when compared with 06/12/2013.   Electronically Signed   By: Rolla Flatten M.D.   On: 01/04/2014 13:18   Dg Chest Portable 1 View  12/31/2013   CLINICAL DATA:  80 year old male with shortness of breath. History of CABG.  EXAM: PORTABLE CHEST - 1 VIEW  COMPARISON:  07/22/2013 and prior radiographs  FINDINGS: The cardiomediastinal silhouette is stable with evidence of CABG again noted.  Pulmonary vascular congestion is present.  Elevation of the right hemidiaphragm and right basilar atelectasis/scarring again noted.  There is no evidence of focal airspace disease, pulmonary edema, suspicious pulmonary nodule/mass, pleural effusion, or pneumothorax. No acute bony abnormalities are identified.  Left shoulder hemiarthroplasty again noted. Surgical clips overlying the upper right chest/ lower neck are noted.  IMPRESSION: Pulmonary vascular congestion without evidence of other acute abnormality.   Electronically Signed   By: Hassan Rowan M.D.   On: 12/31/2013 09:11    Impression:    I had a chance to personally review the patient's MRI scan of the brain. This looked good. The patient was unable to have all of the sequences completed including axial with contrast. However based on the available images, significant improvement was seen. He is seeing cardiology earlier this week and his shortness of breath and is being addressed through them as well as medical oncology.  Plan:  MRI scan of the brain in 3 months, then followup  appointment.     Jodelle Gross, M.D., Ph.D.

## 2014-01-07 ENCOUNTER — Telehealth: Payer: Self-pay | Admitting: *Deleted

## 2014-01-07 ENCOUNTER — Ambulatory Visit (HOSPITAL_COMMUNITY)
Admit: 2014-01-07 | Discharge: 2014-01-07 | Disposition: A | Discharge status: Home / Self Care | Payer: Medicare Other | Source: Ambulatory Visit | Attending: Physician Assistant | Admitting: Physician Assistant

## 2014-01-07 DIAGNOSIS — K4090 Unilateral inguinal hernia, without obstruction or gangrene, not specified as recurrent: Secondary | ICD-10-CM | POA: Insufficient documentation

## 2014-01-07 DIAGNOSIS — Z951 Presence of aortocoronary bypass graft: Secondary | ICD-10-CM | POA: Diagnosis not present

## 2014-01-07 DIAGNOSIS — C799 Secondary malignant neoplasm of unspecified site: Secondary | ICD-10-CM

## 2014-01-07 DIAGNOSIS — C7952 Secondary malignant neoplasm of bone marrow: Secondary | ICD-10-CM | POA: Diagnosis not present

## 2014-01-07 DIAGNOSIS — C7951 Secondary malignant neoplasm of bone: Secondary | ICD-10-CM | POA: Insufficient documentation

## 2014-01-07 DIAGNOSIS — N400 Enlarged prostate without lower urinary tract symptoms: Secondary | ICD-10-CM | POA: Diagnosis not present

## 2014-01-07 DIAGNOSIS — I2510 Atherosclerotic heart disease of native coronary artery without angina pectoris: Secondary | ICD-10-CM | POA: Diagnosis not present

## 2014-01-07 DIAGNOSIS — C7931 Secondary malignant neoplasm of brain: Secondary | ICD-10-CM | POA: Diagnosis not present

## 2014-01-07 DIAGNOSIS — E279 Disorder of adrenal gland, unspecified: Secondary | ICD-10-CM | POA: Insufficient documentation

## 2014-01-07 DIAGNOSIS — C439 Malignant melanoma of skin, unspecified: Secondary | ICD-10-CM

## 2014-01-07 DIAGNOSIS — C434 Malignant melanoma of scalp and neck: Secondary | ICD-10-CM | POA: Diagnosis not present

## 2014-01-07 MED ORDER — IOHEXOL 300 MG/ML  SOLN
100.0000 mL | Freq: Once | INTRAMUSCULAR | Status: AC | PRN
  Administered 2014-01-07: 100 mL via INTRAVENOUS

## 2014-01-07 NOTE — Telephone Encounter (Signed)
Per patient's daughter's request I have canceled treatment appts. Desk RN notified

## 2014-01-13 ENCOUNTER — Other Ambulatory Visit: Payer: Medicare Other

## 2014-01-13 ENCOUNTER — Ambulatory Visit: Payer: Medicare Other

## 2014-01-13 ENCOUNTER — Ambulatory Visit: Payer: Medicare Other | Admitting: Physician Assistant

## 2014-01-27 ENCOUNTER — Other Ambulatory Visit: Payer: Medicare Other

## 2014-01-27 ENCOUNTER — Ambulatory Visit: Payer: Medicare Other

## 2014-01-31 ENCOUNTER — Other Ambulatory Visit: Payer: Self-pay | Admitting: Radiation Therapy

## 2014-01-31 DIAGNOSIS — C7931 Secondary malignant neoplasm of brain: Secondary | ICD-10-CM

## 2014-02-28 ENCOUNTER — Other Ambulatory Visit (HOSPITAL_COMMUNITY): Payer: Self-pay | Admitting: Specialist

## 2014-02-28 DIAGNOSIS — C439 Malignant melanoma of skin, unspecified: Secondary | ICD-10-CM

## 2014-03-01 ENCOUNTER — Encounter (HOSPITAL_COMMUNITY): Payer: Self-pay

## 2014-03-01 ENCOUNTER — Ambulatory Visit (HOSPITAL_COMMUNITY)
Admission: RE | Admit: 2014-03-01 | Discharge: 2014-03-01 | Disposition: A | Discharge status: Home / Self Care | Payer: Medicare Other | Source: Ambulatory Visit | Attending: Specialist | Admitting: Specialist

## 2014-03-01 DIAGNOSIS — Z9221 Personal history of antineoplastic chemotherapy: Secondary | ICD-10-CM | POA: Insufficient documentation

## 2014-03-01 DIAGNOSIS — C439 Malignant melanoma of skin, unspecified: Secondary | ICD-10-CM | POA: Diagnosis present

## 2014-03-01 MED ORDER — IOHEXOL 300 MG/ML  SOLN
100.0000 mL | Freq: Once | INTRAMUSCULAR | Status: AC | PRN
Start: 2014-03-01 — End: 2014-03-01
  Administered 2014-03-01: 100 mL via INTRAVENOUS

## 2014-03-15 ENCOUNTER — Emergency Department (HOSPITAL_COMMUNITY): Payer: Medicare Other

## 2014-03-15 ENCOUNTER — Encounter (HOSPITAL_COMMUNITY): Payer: Self-pay | Admitting: *Deleted

## 2014-03-15 ENCOUNTER — Observation Stay (HOSPITAL_COMMUNITY)
Admission: EM | Admit: 2014-03-15 | Discharge: 2014-03-15 | Disposition: A | Discharge status: Home / Self Care | Payer: Medicare Other | Attending: Internal Medicine | Admitting: Internal Medicine

## 2014-03-15 DIAGNOSIS — Z8619 Personal history of other infectious and parasitic diseases: Secondary | ICD-10-CM | POA: Diagnosis not present

## 2014-03-15 DIAGNOSIS — R079 Chest pain, unspecified: Principal | ICD-10-CM | POA: Diagnosis present

## 2014-03-15 DIAGNOSIS — Z8711 Personal history of peptic ulcer disease: Secondary | ICD-10-CM | POA: Diagnosis not present

## 2014-03-15 DIAGNOSIS — Z8673 Personal history of transient ischemic attack (TIA), and cerebral infarction without residual deficits: Secondary | ICD-10-CM | POA: Insufficient documentation

## 2014-03-15 DIAGNOSIS — R05 Cough: Secondary | ICD-10-CM | POA: Diagnosis not present

## 2014-03-15 DIAGNOSIS — I10 Essential (primary) hypertension: Secondary | ICD-10-CM | POA: Diagnosis not present

## 2014-03-15 DIAGNOSIS — M1990 Unspecified osteoarthritis, unspecified site: Secondary | ICD-10-CM | POA: Diagnosis not present

## 2014-03-15 DIAGNOSIS — Z9181 History of falling: Secondary | ICD-10-CM | POA: Diagnosis not present

## 2014-03-15 DIAGNOSIS — N400 Enlarged prostate without lower urinary tract symptoms: Secondary | ICD-10-CM | POA: Insufficient documentation

## 2014-03-15 DIAGNOSIS — I509 Heart failure, unspecified: Secondary | ICD-10-CM | POA: Insufficient documentation

## 2014-03-15 DIAGNOSIS — K219 Gastro-esophageal reflux disease without esophagitis: Secondary | ICD-10-CM | POA: Insufficient documentation

## 2014-03-15 DIAGNOSIS — K589 Irritable bowel syndrome without diarrhea: Secondary | ICD-10-CM | POA: Diagnosis not present

## 2014-03-15 DIAGNOSIS — Z923 Personal history of irradiation: Secondary | ICD-10-CM | POA: Diagnosis not present

## 2014-03-15 DIAGNOSIS — M549 Dorsalgia, unspecified: Secondary | ICD-10-CM | POA: Insufficient documentation

## 2014-03-15 DIAGNOSIS — Z9981 Dependence on supplemental oxygen: Secondary | ICD-10-CM | POA: Diagnosis not present

## 2014-03-15 DIAGNOSIS — Z951 Presence of aortocoronary bypass graft: Secondary | ICD-10-CM | POA: Insufficient documentation

## 2014-03-15 DIAGNOSIS — Z87891 Personal history of nicotine dependence: Secondary | ICD-10-CM | POA: Insufficient documentation

## 2014-03-15 DIAGNOSIS — Z859 Personal history of malignant neoplasm, unspecified: Secondary | ICD-10-CM | POA: Insufficient documentation

## 2014-03-15 DIAGNOSIS — I4891 Unspecified atrial fibrillation: Secondary | ICD-10-CM | POA: Insufficient documentation

## 2014-03-15 DIAGNOSIS — Z86010 Personal history of colonic polyps: Secondary | ICD-10-CM | POA: Diagnosis not present

## 2014-03-15 DIAGNOSIS — C799 Secondary malignant neoplasm of unspecified site: Secondary | ICD-10-CM | POA: Insufficient documentation

## 2014-03-15 DIAGNOSIS — D649 Anemia, unspecified: Secondary | ICD-10-CM | POA: Insufficient documentation

## 2014-03-15 DIAGNOSIS — Z7951 Long term (current) use of inhaled steroids: Secondary | ICD-10-CM | POA: Insufficient documentation

## 2014-03-15 DIAGNOSIS — I25119 Atherosclerotic heart disease of native coronary artery with unspecified angina pectoris: Secondary | ICD-10-CM | POA: Insufficient documentation

## 2014-03-15 DIAGNOSIS — Z7952 Long term (current) use of systemic steroids: Secondary | ICD-10-CM | POA: Insufficient documentation

## 2014-03-15 DIAGNOSIS — E785 Hyperlipidemia, unspecified: Secondary | ICD-10-CM | POA: Insufficient documentation

## 2014-03-15 DIAGNOSIS — Z8509 Personal history of malignant neoplasm of other digestive organs: Secondary | ICD-10-CM | POA: Diagnosis not present

## 2014-03-15 DIAGNOSIS — I714 Abdominal aortic aneurysm, without rupture: Secondary | ICD-10-CM | POA: Insufficient documentation

## 2014-03-15 DIAGNOSIS — Z7982 Long term (current) use of aspirin: Secondary | ICD-10-CM | POA: Diagnosis not present

## 2014-03-15 DIAGNOSIS — F419 Anxiety disorder, unspecified: Secondary | ICD-10-CM | POA: Diagnosis not present

## 2014-03-15 DIAGNOSIS — Z79899 Other long term (current) drug therapy: Secondary | ICD-10-CM | POA: Diagnosis not present

## 2014-03-15 DIAGNOSIS — Z7901 Long term (current) use of anticoagulants: Secondary | ICD-10-CM | POA: Diagnosis not present

## 2014-03-15 DIAGNOSIS — Z87442 Personal history of urinary calculi: Secondary | ICD-10-CM | POA: Diagnosis not present

## 2014-03-15 DIAGNOSIS — Z86718 Personal history of other venous thrombosis and embolism: Secondary | ICD-10-CM | POA: Insufficient documentation

## 2014-03-15 DIAGNOSIS — R0789 Other chest pain: Secondary | ICD-10-CM

## 2014-03-15 DIAGNOSIS — J449 Chronic obstructive pulmonary disease, unspecified: Secondary | ICD-10-CM | POA: Insufficient documentation

## 2014-03-15 DIAGNOSIS — Z85820 Personal history of malignant melanoma of skin: Secondary | ICD-10-CM | POA: Diagnosis not present

## 2014-03-15 LAB — I-STAT TROPONIN, ED
TROPONIN I, POC: 0 ng/mL (ref 0.00–0.08)
Troponin i, poc: 0 ng/mL (ref 0.00–0.08)

## 2014-03-15 LAB — TROPONIN I: Troponin I: <0.3 ng/mL (ref <0.30)

## 2014-03-15 LAB — CBC WITH DIFFERENTIAL/PLATELET
BASOS ABS: 0 10*3/uL (ref 0.0–0.1)
BASOS PCT: 0 % (ref 0–1)
EOS ABS: 0.2 10*3/uL (ref 0.0–0.7)
Eosinophils Relative: 2 % (ref 0–5)
HCT: 33.8 % — ABNORMAL LOW (ref 39.0–52.0)
Hemoglobin: 10.3 g/dL — ABNORMAL LOW (ref 13.0–17.0)
Lymphocytes Relative: 14 % (ref 12–46)
Lymphs Abs: 1 10*3/uL (ref 0.7–4.0)
MCH: 23.5 pg — ABNORMAL LOW (ref 26.0–34.0)
MCHC: 30.5 g/dL (ref 30.0–36.0)
MCV: 77 fL — ABNORMAL LOW (ref 78.0–100.0)
Monocytes Absolute: 1.1 10*3/uL — ABNORMAL HIGH (ref 0.1–1.0)
Monocytes Relative: 16 % — ABNORMAL HIGH (ref 3–12)
NEUTROS PCT: 68 % (ref 43–77)
Neutro Abs: 4.7 10*3/uL (ref 1.7–7.7)
Platelets: 176 10*3/uL (ref 150–400)
RBC: 4.39 MIL/uL (ref 4.22–5.81)
RDW: 18.4 % — AB (ref 11.5–15.5)
WBC: 6.9 10*3/uL (ref 4.0–10.5)

## 2014-03-15 LAB — BASIC METABOLIC PANEL
ANION GAP: 9 (ref 5–15)
BUN: 19 mg/dL (ref 6–23)
CO2: 29 mEq/L (ref 19–32)
CREATININE: 0.94 mg/dL (ref 0.50–1.35)
Calcium: 10.1 mg/dL (ref 8.4–10.5)
Chloride: 100 mEq/L (ref 96–112)
GFR, EST AFRICAN AMERICAN: 84 mL/min — AB (ref >90)
GFR, EST NON AFRICAN AMERICAN: 72 mL/min — AB (ref >90)
Glucose, Bld: 90 mg/dL (ref 70–99)
POTASSIUM: 4.4 meq/L (ref 3.7–5.3)
Sodium: 138 mEq/L (ref 137–147)

## 2014-03-15 MED ORDER — SENNOSIDES-DOCUSATE SODIUM 8.6-50 MG PO TABS: 1.0000 | ORAL_TABLET | Freq: Every day | ORAL | Status: DC

## 2014-03-15 MED ORDER — IOHEXOL 350 MG/ML SOLN
100.0000 mL | Freq: Once | INTRAVENOUS | Status: AC | PRN
  Administered 2014-03-15: 100 mL via INTRAVENOUS

## 2014-03-15 MED ORDER — FAMOTIDINE 20 MG PO TABS
20.0000 mg | ORAL_TABLET | Freq: Every day | ORAL | Status: DC
  Filled 2014-03-15: qty 1

## 2014-03-15 MED ORDER — ONDANSETRON HCL 4 MG PO TABS: 8.0000 mg | ORAL_TABLET | Freq: Three times a day (TID) | ORAL | Status: DC | PRN

## 2014-03-15 MED ORDER — ACETAMINOPHEN 500 MG PO TABS: 1000.0000 mg | ORAL_TABLET | Freq: Three times a day (TID) | ORAL | Status: DC | PRN

## 2014-03-15 MED ORDER — CITALOPRAM HYDROBROMIDE 10 MG PO TABS
10.0000 mg | ORAL_TABLET | Freq: Every day | ORAL | Status: DC
  Administered 2014-03-15: 10 mg via ORAL
  Filled 2014-03-15: qty 1

## 2014-03-15 MED ORDER — ASPIRIN EC 81 MG PO TBEC
81.0000 mg | DELAYED_RELEASE_TABLET | Freq: Every day | ORAL | Status: DC
  Administered 2014-03-15: 81 mg via ORAL
  Filled 2014-03-15: qty 1

## 2014-03-15 MED ORDER — MUPIROCIN 2 % EX OINT
1.0000 "application " | TOPICAL_OINTMENT | Freq: Three times a day (TID) | CUTANEOUS | Status: DC
  Administered 2014-03-15: 1 via TOPICAL
  Filled 2014-03-15: qty 22

## 2014-03-15 MED ORDER — ATORVASTATIN CALCIUM 40 MG PO TABS: 40.0000 mg | ORAL_TABLET | Freq: Every day | ORAL | Status: DC

## 2014-03-15 MED ORDER — PREDNISONE 5 MG PO TABS
5.0000 mg | ORAL_TABLET | Freq: Every day | ORAL | Status: DC
  Administered 2014-03-15: 5 mg via ORAL
  Filled 2014-03-15: qty 1

## 2014-03-15 MED ORDER — MELATONIN 3 MG PO TABS: 3.0000 mg | ORAL_TABLET | Freq: Every day | ORAL | Status: DC

## 2014-03-15 MED ORDER — METOPROLOL TARTRATE 12.5 MG HALF TABLET: 12.5000 mg | ORAL_TABLET | Freq: Every day | ORAL | Status: DC

## 2014-03-15 MED ORDER — BUDESONIDE-FORMOTEROL FUMARATE 160-4.5 MCG/ACT IN AERO
2.0000 | INHALATION_SPRAY | Freq: Two times a day (BID) | RESPIRATORY_TRACT | Status: DC
  Administered 2014-03-15: 2 via RESPIRATORY_TRACT
  Filled 2014-03-15: qty 6

## 2014-03-15 MED ORDER — ALBUTEROL SULFATE HFA 108 (90 BASE) MCG/ACT IN AERS: 2.0000 | INHALATION_SPRAY | Freq: Four times a day (QID) | RESPIRATORY_TRACT | Status: DC | PRN

## 2014-03-15 MED ORDER — ONDANSETRON HCL 4 MG/2ML IJ SOLN: 4.0000 mg | Freq: Four times a day (QID) | INTRAMUSCULAR | Status: DC | PRN

## 2014-03-15 MED ORDER — POTASSIUM CHLORIDE CRYS ER 20 MEQ PO TBCR
20.0000 meq | EXTENDED_RELEASE_TABLET | Freq: Two times a day (BID) | ORAL | Status: DC
  Administered 2014-03-15: 20 meq via ORAL
  Filled 2014-03-15: qty 2

## 2014-03-15 MED ORDER — ALBUTEROL SULFATE (2.5 MG/3ML) 0.083% IN NEBU: 3.0000 mL | INHALATION_SOLUTION | Freq: Four times a day (QID) | RESPIRATORY_TRACT | Status: DC | PRN

## 2014-03-15 MED ORDER — MECLIZINE HCL 25 MG PO TABS
25.0000 mg | ORAL_TABLET | Freq: Every day | ORAL | Status: DC
  Administered 2014-03-15: 25 mg via ORAL
  Filled 2014-03-15: qty 1

## 2014-03-15 MED ORDER — METOPROLOL SUCCINATE ER 25 MG PO TB24
12.5000 mg | ORAL_TABLET | Freq: Every day | ORAL | Status: DC
  Administered 2014-03-15: 12.5 mg via ORAL
  Filled 2014-03-15: qty 1

## 2014-03-15 MED ORDER — TIOTROPIUM BROMIDE MONOHYDRATE 18 MCG IN CAPS
18.0000 ug | ORAL_CAPSULE | Freq: Every day | RESPIRATORY_TRACT | Status: DC
  Administered 2014-03-15: 18 ug via RESPIRATORY_TRACT
  Filled 2014-03-15: qty 5

## 2014-03-15 MED ORDER — NITROGLYCERIN 0.4 MG SL SUBL: 0.4000 mg | SUBLINGUAL_TABLET | SUBLINGUAL | Status: DC | PRN

## 2014-03-15 MED ORDER — HEPARIN SODIUM (PORCINE) 5000 UNIT/ML IJ SOLN: 5000.0000 [IU] | Freq: Three times a day (TID) | INTRAMUSCULAR | Status: DC

## 2014-03-15 MED ORDER — ADULT MULTIVITAMIN W/MINERALS CH: 1.0000 | ORAL_TABLET | Freq: Every evening | ORAL | Status: DC

## 2014-03-15 NOTE — Consult Note (Signed)
Cardiology Consult Note  Admit date: 03/15/2014 Name: Gabriel Wood 80 y.o.  male DOB:  October 31, 1923 MRN:  [id number]  Today's date:  03/15/2014  Referring Physician: [facility]  Primary Physician:  Dr. Gaynelle Arabian  Reason for Consultation:  Chest pain   IMPRESSIONS: 1. Chest pain that he was admitted with last night was atypical for myocardial ischemia. He has negative cardiovascular enzymes and a nonischemic EKG. He currently has metastatic melanoma and is undergoing immunotherapy. I would not consider a stress test to be helpful here and since this does not appear to be anginal in nature do not think he needs to have further cardiovascular workup 2. Chronic atrial fibrillation with reasonable rate control 3. Metastatic melanoma 4. Coronary artery disease with atrophic mammary artery graft but patent vein grafts that catheterization several years ago following bypass grafting 5. Follow-up to be poor anticoagulation candidate in the past  RECOMMENDATION: EKG chart and history reviewed. He just received immunotherapy last week. I think he can go home later on today. He is doing better on a low-dose of beta blocker and I don't think we need to do further cardiovascular workup at this time. I can see him for his regular appointment  HISTORY: This 80 year old male has a history of coronary artery disease with previous bypass grafting and also has an atrophic mammary graft was the restrained have patent grafts that catheterization in 2003. He also has chronic atrial fibrillation and was taken off of anticoagulation in the past year or so. He denies recurrent angina. He has been undergoing both immunotherapy as well as stereotactic brain surgery for melanoma. His wife died in the past year or so and this has been difficult for him.  Last evening he was awakened with sharp chest pain described as 2 fingers on the left side of his chest that persisted about 30 minutes and then  radiated through to his back. He took a total of 3 nitroglycerin with no effect on the pain and was brought here by EMS. Subsequent enzymes have been negative and a CT scan showed significant coronary and aortic calcifications, a possible infected pneumatocele on the right. He doesn't have pain at the present time. He has been having difficulty with recurrent dizziness and his blood pressure has been low. I stopped his Lasix recently and also his beta blockers had been cut down. He thinks that he may become dizzy whenever his beta blockers are cut down although his atrial fibrillation rate is controlled. He does have oxygen at home and has moderate dyspnea with exertion. He has significant weakness as well as dizziness.  Past Medical History  Diagnosis Date  . History of colon polyps   . Anemia   . Ulcer     history of Peptic Ulcer Disease  . IBS (irritable bowel syndrome)   . Anxiety   . BPH (benign prostatic hypertrophy)   . CAD (coronary artery disease)   . Atrial fibrillation   . AAA (abdominal aortic aneurysm)   . Chronic anticoagulation     On Coumadin therapy ( A Fib)  . Hypertension   . Hyperlipidemia   . History of DVT of lower extremity     pt does not recall this hx on 07/22/2013  . Anginal pain   . Shortness of breath   . Transient ischemic attack (TIA) 07/2012  . Fall at home Dec. 2014  . CHF (congestive heart failure)   . COPD (chronic obstructive pulmonary disease)   . History of  radiation therapy 06/30/13 completed      5/5 srs brain  . Family history of anesthesia complication     "son goes bezerk" (07/22/2013)  . On home oxygen therapy     "2L; w/activity only recently & prn shortness of breath" (07/22/2013)  . H/O hiatal hernia   . Arthritis   . Kidney stone   . Melanoma Sept. 2013    ,Right  posterior shoulder  . Metastatic melanoma     [name] 07/22/2013  . Melanoma of neck     X 2  . Cancer     "somewhere in my abdomen" (07/22/2013)  . GERD (gastroesophageal  reflux disease)   . Stroke 2015    "weakness on right side since"  . S/P radiation therapy 06/24/13-06/30/13    Left iliac 20Gy/13fx      Past Surgical History  Procedure Laterality Date  . Appendectomy  1957  . Cholecystectomy  1973  . Shoulder open rotator cuff repair Bilateral     "twice on the right; once on the left"  . Coronary artery bypass graft  1999    CABG X4  . Tibia fracture surgery Left 2011    tibial ORIF by Dr. Sharol Given  . Ankle fusion Left 2011  . Lymph node biopsy    . Total shoulder replacement Left Dec.  2014  . Kidney stone surgery      "once"  . Joint replacement    . Cystoscopy w/ stone manipulation      "3-4 times"  . Tonsillectomy  1957  . Melanoma excision Right 05/2011; ~ 12/2011; 2014    shoulder;  neck; neck     Allergies:  is allergic to morphine and related; codeine; and buprenorphine hcl.   Medications: Prior to Admission medications   Medication Sig Start Date End Date Taking? Authorizing Provider  acetaminophen (TYLENOL) 650 MG CR tablet Take 1,300 mg by mouth every 8 (eight) hours as needed for pain.   Yes Historical Provider, MD  albuterol (PROVENTIL HFA;VENTOLIN HFA) 108 (90 BASE) MCG/ACT inhaler Inhale 2 puffs into the lungs every 6 (six) hours as needed for wheezing or shortness of breath.   Yes Historical Provider, MD  aspirin EC 81 MG tablet Take 81 mg by mouth daily.   Yes Historical Provider, MD  atorvastatin (LIPITOR) 40 MG tablet Take 40 mg by mouth at bedtime.  02/12/11  Yes Historical Provider, MD  budesonide-formoterol (SYMBICORT) 160-4.5 MCG/ACT inhaler Inhale 2 puffs into the lungs 2 (two) times daily.   Yes Historical Provider, MD  citalopram (CELEXA) 10 MG tablet Take 10 mg by mouth daily.  03/07/11  Yes Historical Provider, MD  famotidine (PEPCID) 20 MG tablet Take 20 mg by mouth daily.   Yes Historical Provider, MD  meclizine (ANTIVERT) 25 MG tablet Take 25 mg by mouth daily.    Yes Historical Provider, MD  Melatonin 3 MG TABS  Take 3 mg by mouth at bedtime.    Yes Historical Provider, MD  metoprolol succinate (TOPROL-XL) 25 MG 24 hr tablet Take 12.5 mg by mouth daily.   Yes Historical Provider, MD  Multiple Vitamin (MULTIVITAMIN WITH MINERALS) TABS Take 1 tablet by mouth every evening.   Yes Historical Provider, MD  mupirocin ointment (BACTROBAN) 2 % Apply 1 application topically 3 (three) times daily. (at 6am, noon and 6pm). 03/08/14  Yes Historical Provider, MD  nitroGLYCERIN (NITROSTAT) 0.4 MG SL tablet Place 0.4 mg under the tongue every 5 (five) minutes as needed for chest pain.  Yes Historical Provider, MD  ondansetron (ZOFRAN) 8 MG tablet Take 8 mg by mouth every 8 (eight) hours as needed for nausea or vomiting.   Yes Historical Provider, MD  potassium chloride SA (K-DUR,KLOR-CON) 20 MEQ tablet Take 20 mEq by mouth 2 (two) times daily.  08/07/13 08/07/14 Yes Historical Provider, MD  predniSONE (DELTASONE) 5 MG tablet Take 5 mg by mouth daily. 10/28/13  Yes Historical Provider, MD  senna-docusate (RA SENNA PLUS) 8.6-50 MG per tablet Take 1 tablet by mouth at bedtime.  08/30/13  Yes Historical Provider, MD  tiotropium (SPIRIVA) 18 MCG inhalation capsule Place 18 mcg into inhaler and inhale daily.   Yes Historical Provider, MD  OXYGEN-HELIUM IN Inhale 2 % into the lungs daily. daily as needed. Frequency:PHARMDIR   Dosage:0.0     Instructions:  Note:Diagnosis: COPD HT:5'9 WT: 89KG Home O2 @ 2LPM via [place], as needed to keep O2 >92%. Concentrator, supplies, & Portable tank for transport. Sats on RA @ rest 06/09/12   Historical Provider, MD    Family History: Family Status  Relation Status Death Age  . Mother Deceased   . Father Deceased     Social History:   reports that he quit smoking about 17 years ago. His smoking use included Cigarettes. He has a 55 pack-year smoking history. He has never used smokeless tobacco. He reports that he drinks alcohol. He reports that he does not use illicit drugs.   History   Social  History Narrative    Review of Systems: Occasional dizziness  Physical Exam: BP 104/62 mmHg  Pulse 86  Temp(Src) 97.8 F (36.6 C) (Oral)  Resp 18  Ht 5\' 9"  (1.753 m)  Wt 74.299 kg (163 lb 12.8 oz)  BMI 24.18 kg/m2  SpO2 100%  General appearance: Elderly pleasant male in no acute distress Head: Normocephalic, without obvious abnormality, atraumatic, Balding male hair pattern Neck: no adenopathy, no carotid bruit and no JVD Lungs: Reduced breath sounds, no rales Heart: Irregular rate and rhythm, normal S1 and S2, 2/6 blowing systolic murmur heard at apex, healed median sternotomy scar Abdomen: Soft mild prominent aortic impulse Rectal: deferred Extremities: extremities normal, atraumatic, no cyanosis or edema Pulses: 2+ and symmetric Neurologic: Grossly normal  Labs: CBC  Recent Labs  03/15/14 0255  WBC 6.9  RBC 4.39  HGB 10.3*  HCT 33.8*  PLT 176  MCV 77.0*  MCH 23.5*  MCHC 30.5  RDW 18.4*  LYMPHSABS 1.0  MONOABS 1.1*  EOSABS 0.2  BASOSABS 0.0   CMP   Recent Labs  03/15/14 0255  NA 138  K 4.4  CL 100  CO2 29  GLUCOSE 90  BUN 19  CREATININE 0.94  CALCIUM 10.1  GFRNONAA 72*  GFRAA 84*   BNP (last 3 results)  Recent Labs  05/04/13 1930 07/22/13 1045 12/31/13 0851  PROBNP 604.2* 758.8* 970.5*   Cardiac Panel (last 3 results)  Recent Labs  03/15/14 0604 03/15/14 0810 03/15/14 1130  TROPONINI <0.30 <0.30 <0.30     Radiology: Possible nodule. CT scan shows no evidence of a lung nodule but does show a possible infected pneumatocele on the left.  EKG: Nonspecific ST changes, atrial fibrillation, left axis deviation  Signed:  W. Doristine Church MD [facility]   Cardiology Consultant  03/15/2014, 1:36 PM

## 2014-03-15 NOTE — Progress Notes (Signed)
PATIENT DETAILS Name: Gabriel Wood Age: 80 y.o. Sex: male Date of Birth: 06/06/1923 Admit Date: 03/15/2014 Admitting Physician Etta Quill, DO IWL:[id number],HERDEY R, MD  Subjective: No further chest pain  Assessment/Plan: Active Problems:   Chest pain: Known history of coronary artery disease and is status post CABG, admitted with left-sided chest pain that has since resolved. EKG without concerning findings, troponins negative. Await cardiology-Dr. Wynonia Lawman prior to discharge. Continue with aspirin, statins and beta blockers.  History of malignant melanoma: With metastases to lung, brain and bone. He is status post He is status post stereotactic radiotherapy to a solitary brain lesions in addition to palliative radiotherapy to the metastatic bone lesion in the left hip. Undergoing immunotherapy with single agent Nivolumab at the discretion of oncology.  History of COPD: On home O2, continue with Spiriva, budesonide and prednisone. Lungs currently clear and exam.  History of atrial fibrillation: EKG admission continues to show atrial fibrillation, rate controlled with metoprolol, suspect not long-term anticoagulation candidate. Continue aspirin.  Dyslipidemia: Continue with statin.  Hypertension: Continue with metoprolol. BP appears to be reasonably well controlled.  Disposition: Remain inpatient-home later today after cardiology evaluation  Antibiotics:  None   Anti-infectives    None      DVT Prophylaxis: Prophylactic Heparin  Code Status: Full code  Family Communication None at bedside  Procedures:  None  CONSULTS:  cardiology  Time spent 40 minutes-which includes 50% of the time with face-to-face with patient/ family and coordinating care related to the above assessment and plan.  MEDICATIONS: Scheduled Meds: . aspirin EC  81 mg Oral Daily  . atorvastatin  40 mg Oral QHS  . budesonide-formoterol  2 puff Inhalation BID  . citalopram   10 mg Oral Daily  . famotidine  20 mg Oral Daily  . heparin  5,000 Units Subcutaneous 3 times per day  . meclizine  25 mg Oral Daily  . metoprolol succinate  12.5 mg Oral Daily  . multivitamin with minerals  1 tablet Oral QPM  . mupirocin ointment  1 application Topical TID  . potassium chloride SA  20 mEq Oral BID  . predniSONE  5 mg Oral Daily  . senna-docusate  1 tablet Oral QHS  . tiotropium  18 mcg Inhalation Daily   Continuous Infusions:  PRN Meds:.acetaminophen, albuterol, nitroGLYCERIN, ondansetron (ZOFRAN) IV, ondansetron    PHYSICAL EXAM: Vital signs in last 24 hours: Filed Vitals:   03/15/14 0619 03/15/14 0657 03/15/14 0739 03/15/14 0832  BP:  128/71  104/62  Pulse:  77  86  Temp:  97.7 F (36.5 C)  97.8 F (36.6 C)  TempSrc:  Oral  Oral  Resp: 14 18  18   Height:  5\' 9"  (1.753 m)    Weight:  74.299 kg (163 lb 12.8 oz)    SpO2:  100% 100% 100%    Weight change:  Filed Weights   03/15/14 0234 03/15/14 0657  Weight: 73.029 kg (161 lb) 74.299 kg (163 lb 12.8 oz)   Body mass index is 24.18 kg/(m^2).   Gen Exam: Awake and alert with clear speech.   Neck: Supple, No JVD.   Chest: B/L Clear.   CVS: S1 S2 Regular, no murmurs.  Abdomen: soft, BS +, non tender, non distended.  Extremities: no edema, lower extremities warm to touch Neurologic: Non Focal.   Skin: No Rash.   Wounds: N/A.    Intake/Output from previous day: No intake or output data in  the 24 hours ending 03/15/14 1230   LAB RESULTS: CBC  Recent Labs Lab 03/15/14 0255  WBC 6.9  HGB 10.3*  HCT 33.8*  PLT 176  MCV 77.0*  MCH 23.5*  MCHC 30.5  RDW 18.4*  LYMPHSABS 1.0  MONOABS 1.1*  EOSABS 0.2  BASOSABS 0.0    Chemistries   Recent Labs Lab 03/15/14 0255  NA 138  K 4.4  CL 100  CO2 29  GLUCOSE 90  BUN 19  CREATININE 0.94  CALCIUM 10.1    CBG: No results for input(s): GLUCAP in the last 168 hours.  GFR Estimated Creatinine Clearance: 54.3 mL/min (by C-G formula based  on Cr of 0.94).  Coagulation profile No results for input(s): INR, PROTIME in the last 168 hours.  Cardiac Enzymes  Recent Labs Lab 03/15/14 0604 03/15/14 0810  TROPONINI <0.30 <0.30    Invalid input(s): POCBNP No results for input(s): DDIMER in the last 72 hours. No results for input(s): HGBA1C in the last 72 hours. No results for input(s): CHOL, HDL, LDLCALC, TRIG, CHOLHDL, LDLDIRECT in the last 72 hours. No results for input(s): TSH, T4TOTAL, T3FREE, THYROIDAB in the last 72 hours.  Invalid input(s): FREET3 No results for input(s): VITAMINB12, FOLATE, FERRITIN, TIBC, IRON, RETICCTPCT in the last 72 hours. No results for input(s): LIPASE, AMYLASE in the last 72 hours.  Urine Studies No results for input(s): UHGB, CRYS in the last 72 hours.  Invalid input(s): UACOL, UAPR, USPG, UPH, UTP, UGL, UKET, UBIL, UNIT, UROB, ULEU, UEPI, UWBC, URBC, UBAC, CAST, UCOM, BILUA  MICROBIOLOGY: No results found for this or any previous visit (from the past 240 hour(s)).  RADIOLOGY STUDIES/RESULTS: Ct Angio Chest Pe W/cm &/or Wo Cm  03/15/2014   CLINICAL DATA:  Left-sided chest pain radiating at 1 o'clock this morning. History of metastatic melanoma.  EXAM: CT ANGIOGRAPHY CHEST WITH CONTRAST  TECHNIQUE: Multidetector CT imaging of the chest was performed using the standard protocol during bolus administration of intravenous contrast. Multiplanar CT image reconstructions and MIPs were obtained to evaluate the vascular anatomy.  CONTRAST:  165mL OMNIPAQUE IOHEXOL 350 MG/ML SOLN  COMPARISON:  01/07/2014  FINDINGS: Technically adequate study with good opacification of the central and segmental pulmonary arteries. No focal filling defects demonstrated. No evidence of significant pulmonary embolus.  Postoperative changes in the mediastinum. Cardiac enlargement. Coronary artery and aortic calcification. Lymph nodes in the mediastinum are not pathologically enlarged. Esophagus is decompressed.  No pleural  effusion. No pneumothorax. Mucous with in the trachea and right mainstem bronchus. Diffuse emphysematous changes in the lungs with peripheral fibrosis. Multiple pneumatoceles. Thick-walled pneumatocele in the right lung base posteriorly. This may be infected. Wall thickening is new since previous study. Tiny nodule in the right middle lung is stable since previous study, measuring less than 4 mm. No suggestion of developing nodule. Nodular opacity seen on plain film may have represented artifact.  Included portions of the upper abdominal organs demonstrate surgical absence of the gallbladder. Low-attenuation lesion in the left lobe of the liver measures 3.9 by 3.4 cm diameter, unchanged since previous study. Extensive upper abdominal vascular calcifications. Calcified granulomas in the spleen. No destructive bone lesions. Postoperative changes in the left shoulder.  Review of the MIP images confirms the above findings.  IMPRESSION: No evidence of significant pulmonary embolus. Chronic fibrosis and cystic changes in the lungs. No developing pulmonary nodules identified. Left lobe liver lesion is unchanged since previous study.   Electronically Signed   By: Oren Beckmann.D.  On: 03/15/2014 05:12   Ct Abdomen Pelvis W Contrast  03/01/2014   CLINICAL DATA:  Restaging metastatic melanoma. Chemotherapy 12/2013.  EXAM: CT ABDOMEN AND PELVIS WITH CONTRAST  TECHNIQUE: Multidetector CT imaging of the abdomen and pelvis was performed using the standard protocol following bolus administration of intravenous contrast.  CONTRAST:  148mL OMNIPAQUE IOHEXOL 300 MG/ML  SOLN  COMPARISON:  01/07/2014  FINDINGS: Cystic structures are again noted in the lungs at the right lung base and in the lingula, stable. COPD changes present. Scarring in the lung bases bilaterally.  Prior CABG. Heart is normal size. Elevation of the right hemidiaphragm. 4 cm cyst in the left hepatic lobe is stable. No new hepatic lesions. Spleen, pancreas,  left adrenal are unremarkable. Slight fullness of the right adrenal gland is noted and stable or less pronounced than prior study. This measures approximately 11 mm on today's study.  Bilateral nonobstructing lower pole renal stones. Bilateral renal cysts. Scarring in the lower pole of the right kidney. No hydronephrosis. Previously seen nodular soft tissue inferior to the left kidney is again noted, measuring approximately 11 mm, stable.  Moderate stool burden throughout the colon. Small bowel is decompressed. Aorta is heavily calcified and ectatic, non aneurysmal. No free fluid, free air or adenopathy. Urinary bladder is unremarkable. Prostate is enlarged.  Left iliac crest mixed lytic and sclerotic lesion is again noted, at L5 with birth defects again noted spondylolisthesis. Degenerative changes throughout the lumbar spine.  IMPRESSION: Stable exam since prior study.  No worsening or new disease.   Electronically Signed   By: Rolm Baptise M.D.   On: 03/01/2014 16:08   Dg Chest Port 1 View  03/15/2014   CLINICAL DATA:  Left-sided chest pain radiating to the back with shortness of breath tonight. History of melanoma.  EXAM: PORTABLE CHEST - 1 VIEW  COMPARISON:  Chest 12/31/2013.  CT chest 01/07/2014.  FINDINGS: Postoperative changes in the mediastinum. Postoperative changes in the left shoulder. Shallow inspiration with elevation of right hemidiaphragm. Linear scarring in the right lung base. Normal heart size and pulmonary vascularity. There is a developing nodule in the right cardiophrenic angle measuring 18 mm diameter. This is suspicious for metastasis. No blunting of costophrenic angles. No pneumothorax.  IMPRESSION: Developing nodule in the right cardiophrenic angle measuring 18 mm, worrisome for metastasis.   Electronically Signed   By: Lucienne Capers M.D.   On: 03/15/2014 03:16    Oren Binet, MD  [facility] Pager:[phone]  If 7PM-7AM, please contact  night-coverage [email or web address] Password [email or web address] 03/15/2014, 12:30 PM   LOS: 0 days

## 2014-03-15 NOTE — ED Notes (Signed)
Attempted report 

## 2014-03-15 NOTE — ED Provider Notes (Signed)
CSN: [id number]     Arrival date & time 03/15/14  0223 History  This chart was scribed for Kalman Drape, MD by Delphia Grates, ED Scribe. This patient was seen in room A04C/A04C and the patient's care was started at 3:04 AM.   Chief Complaint  Patient presents with  . Chest Pain    The history is provided by the patient. No language interpreter was used.     HPI Comments: Gabriel Wood is a 80 y.o. male, with history of HTN, CAD, A-fib , metastatic melanoma on chemotherapy and CHF, brought in by ambulance, who presents to the Emergency Department complaining of sudden onset, sharp chest pain that began approximately 2 hours ago. Patient states he woke up with chest pain that radiates to the left side of back. Patient reports the pain lasted for 15 minutes after administration of nitroglycerin. He denies any pain at present. Patient is coughing, but states he does not have this cough all the time and believe this is related to slight cold. He denies history of prior episodes. Patient is currently not taking any blood thinners, and states he was taken off this medication in May 2014. Patient is currently undergoing chemotherapy as treatment for melanoma.  He also reports he has a staph infection that is "hard to heal". He denies any worsening SOB or pain to BLE. He reports a lump on the back of his left leg, but states this went away. Family reports nose bleeds, but was informed this was likely due to a nasal steroid medication that patient was taking. He denies fever, productive cough. Patient also denies history of PE/DVT.   PCP: Dr. Oran Rein  Past Medical History  Diagnosis Date  . History of colon polyps   . Anemia   . Ulcer     history of Peptic Ulcer Disease  . IBS (irritable bowel syndrome)   . Anxiety   . BPH (benign prostatic hypertrophy)   . CAD (coronary artery disease)   . Atrial fibrillation   . AAA (abdominal aortic aneurysm)   . Chronic anticoagulation     On Coumadin  therapy ( A Fib)  . Hypertension   . Hyperlipidemia   . History of DVT of lower extremity     pt does not recall this hx on 07/22/2013  . Anginal pain   . Shortness of breath   . Transient ischemic attack (TIA) 07/2012  . Fall at home Dec. 2014  . CHF (congestive heart failure)   . COPD (chronic obstructive pulmonary disease)   . History of radiation therapy 06/30/13 completed      5/5 srs brain  . Family history of anesthesia complication     "son goes bezerk" (07/22/2013)  . On home oxygen therapy     "2L; w/activity only recently & prn shortness of breath" (07/22/2013)  . H/O hiatal hernia   . Arthritis   . Kidney stone   . Melanoma Sept. 2013    ,Right  posterior shoulder  . Metastatic melanoma     [name] 07/22/2013  . Melanoma of neck     X 2  . Cancer     "somewhere in my abdomen" (07/22/2013)  . GERD (gastroesophageal reflux disease)   . Stroke 2015    "weakness on right side since"  . S/P radiation therapy 06/24/13-06/30/13    Left iliac 20Gy/61fx   Past Surgical History  Procedure Laterality Date  . Appendectomy  1957  . Cholecystectomy  1973  .  Shoulder open rotator cuff repair Bilateral     "twice on the right; once on the left"  . Coronary artery bypass graft  1999    CABG X4  . Tibia fracture surgery Left 2011    tibial ORIF by Dr. Sharol Given  . Ankle fusion Left 2011  . Lymph node biopsy    . Total shoulder replacement Left Dec.  2014  . Kidney stone surgery      "once"  . Joint replacement    . Cystoscopy w/ stone manipulation      "3-4 times"  . Tonsillectomy  1957  . Melanoma excision Right 05/2011; ~ 12/2011; 2014    shoulder;  neck; neck   Family History  Problem Relation Age of Onset  . Cancer Mother     stomach cancer  . Heart disease Brother   . Heart disease Brother   . Heart disease Brother    History  Substance Use Topics  . Smoking status: Former Smoker -- 1.00 packs/day for 55 years    Types: Cigarettes    Quit date: 04/08/1996  .  Smokeless tobacco: Never Used  . 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   Cardiovascular: Positive for chest pain.  Musculoskeletal: Positive for back pain.      Allergies  Morphine and related and Codeine  Home Medications   Prior to Admission medications   Medication Sig Start Date End Date Taking? Authorizing Provider  acetaminophen (TYLENOL) 650 MG CR tablet Take 1,300 mg by mouth every 8 (eight) hours as needed for pain.    Historical Provider, MD  albuterol (PROVENTIL HFA;VENTOLIN HFA) 108 (90 BASE) MCG/ACT inhaler Inhale 2 puffs into the lungs every 6 (six) hours as needed for wheezing or shortness of breath.    Historical Provider, MD  aspirin EC 81 MG tablet Take 81 mg by mouth daily.    Historical Provider, MD  atorvastatin (LIPITOR) 40 MG tablet Take 40 mg by mouth at bedtime.  02/12/11   Historical Provider, MD  budesonide-formoterol (SYMBICORT) 160-4.5 MCG/ACT inhaler Inhale 2 puffs into the lungs 2 (two) times daily.    Historical Provider, MD  citalopram (CELEXA) 10 MG tablet Take 10 mg by mouth daily.  03/07/11   Historical Provider, MD  famotidine (PEPCID) 20 MG tablet Take 20 mg by mouth daily.    Historical Provider, MD  meclizine (ANTIVERT) 25 MG tablet Take 25 mg by mouth daily.     Historical Provider, MD  Melatonin 3 MG TABS Take 3 mg by mouth at bedtime.     Historical Provider, MD  metoprolol tartrate (LOPRESSOR) 25 MG tablet Take 25 mg by mouth daily.     Historical Provider, MD  Multiple Vitamin (MULTIVITAMIN WITH MINERALS) TABS Take 1 tablet by mouth every evening.    Historical Provider, MD  nitroGLYCERIN (NITROSTAT) 0.4 MG SL tablet Place 0.4 mg under the tongue every 5 (five) minutes as needed for chest pain.    Historical Provider, MD  OXYGEN-HELIUM IN Inhale 2 % into the lungs daily. daily as needed. Frequency:PHARMDIR    Dosage:0.0     Instructions:  Note:Diagnosis: COPD HT:5'9 WT: 89KG Home O2 @ 2LPM via [place], as needed to keep O2 >92%. Concentrator, supplies, & Portable tank for transport. Sats on RA @ rest 06/09/12   Historical Provider, MD  potassium chloride SA (K-DUR,KLOR-CON) 20 MEQ tablet Take 20 mEq by mouth 2 (two) times daily.  08/07/13 08/07/14  Historical Provider, MD  predniSONE (DELTASONE) 5 MG tablet Take 5 mg by mouth daily. 10/28/13   Historical Provider, MD  senna-docusate (RA SENNA PLUS) 8.6-50 MG per tablet Take 1 tablet by mouth at bedtime.  08/30/13   Historical Provider, MD   Triage Vitals: BP 106/69 mmHg  Pulse 86  Temp(Src) 97.7 F (36.5 C) (Oral)  Resp 16  Ht 5\' 9"  (1.753 m)  Wt 161 lb (73.029 kg)  BMI 23.76 kg/m2  SpO2 100%  Physical Exam  Constitutional: He is oriented to person, place, and time. He appears well-developed and well-nourished. No distress.  HENT:  Head: Normocephalic and atraumatic.  Nose: Nose normal.  Mouth/Throat: Oropharynx is clear and moist.  Eyes: Conjunctivae and EOM are normal. Pupils are equal, round, and reactive to light.  Neck: Normal range of motion. Neck supple. No JVD present. No tracheal deviation present. No thyromegaly present.  Cardiovascular: Normal rate, regular rhythm, normal heart sounds and intact distal pulses.  Exam reveals no gallop and no friction rub.   No murmur heard. Pulmonary/Chest: Effort normal and breath sounds normal. No stridor. No respiratory distress. He has no wheezes. He has no rales. He exhibits no tenderness.  Productive cough  Abdominal: Soft. Bowel sounds are normal. He exhibits no distension and no mass. There is no tenderness. There is no rebound and no guarding.  Musculoskeletal: Normal range of motion. He exhibits no edema or tenderness.  Lymphadenopathy:    He has no cervical adenopathy.  Neurological: He is alert and oriented to person, place, and time. He displays normal reflexes. He exhibits normal muscle tone.  Coordination normal.  Skin: Skin is warm and dry. No rash noted. No erythema. No pallor.  Psychiatric: He has a normal mood and affect. His behavior is normal. Judgment and thought content normal.  Nursing note and vitals reviewed.   ED Course  Procedures (including critical care time)  DIAGNOSTIC STUDIES: Oxygen Saturation is 100% on room air, normal by my interpretation.    COORDINATION OF CARE: At 0312 Discussed treatment plan with patient which includes labs. Patient agrees.    Labs Review Labs Reviewed  CBC WITH DIFFERENTIAL - Abnormal; Notable for the following:    Hemoglobin 10.3 (*)    HCT 33.8 (*)    MCV 77.0 (*)    MCH 23.5 (*)    RDW 18.4 (*)    Monocytes Relative 16 (*)    Monocytes Absolute 1.1 (*)    All other components within normal limits  BASIC METABOLIC PANEL - Abnormal; Notable for the following:    GFR calc non Af Amer 72 (*)    GFR calc Af Amer 84 (*)    All other components within normal limits  I-STAT TROPOININ, ED  I-STAT TROPOININ, ED    Imaging Review Ct Angio Chest Pe W/cm &/or Wo Cm  03/15/2014   CLINICAL DATA:  Left-sided chest pain radiating at 1 o'clock this morning. History of metastatic melanoma.  EXAM: CT ANGIOGRAPHY CHEST WITH CONTRAST  TECHNIQUE: Multidetector CT imaging of the chest was performed using the standard protocol during bolus administration of intravenous contrast. Multiplanar CT image reconstructions and MIPs were obtained to evaluate the vascular anatomy.  CONTRAST:  168mL OMNIPAQUE IOHEXOL 350 MG/ML SOLN  COMPARISON:  01/07/2014  FINDINGS: Technically adequate study with good opacification of the central and segmental pulmonary arteries. No focal filling defects  demonstrated. No evidence of significant pulmonary embolus.  Postoperative changes in the mediastinum. Cardiac enlargement. Coronary artery and aortic calcification. Lymph nodes in the mediastinum are not pathologically enlarged. Esophagus is decompressed.  No pleural  effusion. No pneumothorax. Mucous with in the trachea and right mainstem bronchus. Diffuse emphysematous changes in the lungs with peripheral fibrosis. Multiple pneumatoceles. Thick-walled pneumatocele in the right lung base posteriorly. This may be infected. Wall thickening is new since previous study. Tiny nodule in the right middle lung is stable since previous study, measuring less than 4 mm. No suggestion of developing nodule. Nodular opacity seen on plain film may have represented artifact.  Included portions of the upper abdominal organs demonstrate surgical absence of the gallbladder. Low-attenuation lesion in the left lobe of the liver measures 3.9 by 3.4 cm diameter, unchanged since previous study. Extensive upper abdominal vascular calcifications. Calcified granulomas in the spleen. No destructive bone lesions. Postoperative changes in the left shoulder.  Review of the MIP images confirms the above findings.  IMPRESSION: No evidence of significant pulmonary embolus. Chronic fibrosis and cystic changes in the lungs. No developing pulmonary nodules identified. Left lobe liver lesion is unchanged since previous study.   Electronically Signed   By: Lucienne Capers M.D.   On: 03/15/2014 05:12   Dg Chest Port 1 View  03/15/2014   CLINICAL DATA:  Left-sided chest pain radiating to the back with shortness of breath tonight. History of melanoma.  EXAM: PORTABLE CHEST - 1 VIEW  COMPARISON:  Chest 12/31/2013.  CT chest 01/07/2014.  FINDINGS: Postoperative changes in the mediastinum. Postoperative changes in the left shoulder. Shallow inspiration with elevation of right hemidiaphragm. Linear scarring in the right lung base. Normal heart size and pulmonary vascularity. There is a developing nodule in the right cardiophrenic angle measuring 18 mm diameter. This is suspicious for metastasis. No blunting of costophrenic angles. No pneumothorax.  IMPRESSION: Developing nodule in the right cardiophrenic angle measuring  18 mm, worrisome for metastasis.   Electronically Signed   By: Lucienne Capers M.D.   On: 03/15/2014 03:16     EKG Interpretation None      Date: 03/15/2014  Rate: 83  Rhythm: atrial fibrillation  QRS Axis: normal  Intervals: normal  ST/T Wave abnormalities: normal  Conduction Disutrbances:afib  Narrative Interpretation:   Old EKG Reviewed: none available   MDM   Final diagnoses:  Chest pain    80 year old male with acute onset of chest pain tonight, history of A. fib, coronary disease status post CABG.  Patient has complaint of bump in the back of his left leg yesterday has since resolved.  He is currently undergoing treatment for melanoma.  He also has history of A. fib, which is not on anticoagulation.  Patient reports resolution of pain after nitroglycerin.  No pain at present.  Patient has risk factor for PE, we'll plan for CT angioma chest.  Patient otherwise looks nontoxic, no pain.  Expect a CT angiogram is negative, we'll discuss with hospitalist for chest pain obs admit.  I personally performed the services described in this documentation, which was scribed in my presence. The recorded information has been reviewed and is accurate.     Kalman Drape, MD 03/15/14 [phone]

## 2014-03-15 NOTE — H&P (Signed)
[facility] History and Physical  CORDNEY BARSTOW WLN:[id number] DOB: 12/26/1923 DOA: 03/15/2014  Referring physician: EDP PCP: Simona Huh, MD   Chief Complaint: Chest pain   HPI: Gabriel Wood is a 80 y.o. male who awoke from sleep this morning with sudden onset of L sided chest pain.  Pain is sharp in quality, onset 2 hours ago, radiates to left side of back and left arm.  Pain lasted for 15 mins after he took 3 NTG.  Currently pain free in ED.  Does have a cough and cold recently.  Patient does have a history of CAD s/p CABG in 1999.  He also has a history of metastatic melanoma and is currently undergoing chemotherapy for this.  He has a history of chronic A.Fib but was taken off of blood thinners in May 2014.  No fever, no productive cough, no h/o PE nor DVT.  Review of Systems: Systems reviewed.  As above, otherwise negative  Past Medical History  Diagnosis Date  . History of colon polyps   . Anemia   . Ulcer     history of Peptic Ulcer Disease  . IBS (irritable bowel syndrome)   . Anxiety   . BPH (benign prostatic hypertrophy)   . CAD (coronary artery disease)   . Atrial fibrillation   . AAA (abdominal aortic aneurysm)   . Chronic anticoagulation     On Coumadin therapy ( A Fib)  . Hypertension   . Hyperlipidemia   . History of DVT of lower extremity     pt does not recall this hx on 07/22/2013  . Anginal pain   . Shortness of breath   . Transient ischemic attack (TIA) 07/2012  . Fall at home Dec. 2014  . CHF (congestive heart failure)   . COPD (chronic obstructive pulmonary disease)   . History of radiation therapy 06/30/13 completed      5/5 srs brain  . Family history of anesthesia complication     "son goes bezerk" (07/22/2013)  . On home oxygen therapy     "2L; w/activity only recently & prn shortness of breath" (07/22/2013)  . H/O hiatal hernia   . Arthritis   . Kidney stone   . Melanoma Sept. 2013    ,Right  posterior shoulder  .  Metastatic melanoma     [name] 07/22/2013  . Melanoma of neck     X 2  . Cancer     "somewhere in my abdomen" (07/22/2013)  . GERD (gastroesophageal reflux disease)   . Stroke 2015    "weakness on right side since"  . S/P radiation therapy 06/24/13-06/30/13    Left iliac 20Gy/70fx   Past Surgical History  Procedure Laterality Date  . Appendectomy  1957  . Cholecystectomy  1973  . Shoulder open rotator cuff repair Bilateral     "twice on the right; once on the left"  . Coronary artery bypass graft  1999    CABG X4  . Tibia fracture surgery Left 2011    tibial ORIF by Dr. Sharol Given  . Ankle fusion Left 2011  . Lymph node biopsy    . Total shoulder replacement Left Dec.  2014  . Kidney stone surgery      "once"  . Joint replacement    . Cystoscopy w/ stone manipulation      "3-4 times"  . Tonsillectomy  1957  . Melanoma excision Right 05/2011; ~ 12/2011; 2014    shoulder;  neck; neck   Social  History:  reports that he quit smoking about 17 years ago. His smoking use included Cigarettes. He has a 55 pack-year smoking history. He has never used smokeless tobacco. He reports that he drinks alcohol. He reports that he does not use illicit drugs.  Allergies  Allergen Reactions  . Morphine And Related Palpitations  . Codeine Nausea Only  . Buprenorphine Hcl Palpitations    Family History  Problem Relation Age of Onset  . Cancer Mother     stomach cancer  . Heart disease Brother   . Heart disease Brother   . Heart disease Brother      Prior to Admission medications   Medication Sig Start Date End Date Taking? Authorizing Provider  acetaminophen (TYLENOL) 650 MG CR tablet Take 1,300 mg by mouth every 8 (eight) hours as needed for pain.   Yes Historical Provider, MD  albuterol (PROVENTIL HFA;VENTOLIN HFA) 108 (90 BASE) MCG/ACT inhaler Inhale 2 puffs into the lungs every 6 (six) hours as needed for wheezing or shortness of breath.   Yes Historical Provider, MD  aspirin EC 81 MG tablet  Take 81 mg by mouth daily.   Yes Historical Provider, MD  atorvastatin (LIPITOR) 40 MG tablet Take 40 mg by mouth at bedtime.  02/12/11  Yes Historical Provider, MD  budesonide-formoterol (SYMBICORT) 160-4.5 MCG/ACT inhaler Inhale 2 puffs into the lungs 2 (two) times daily.   Yes Historical Provider, MD  citalopram (CELEXA) 10 MG tablet Take 10 mg by mouth daily.  03/07/11  Yes Historical Provider, MD  famotidine (PEPCID) 20 MG tablet Take 20 mg by mouth daily.   Yes Historical Provider, MD  meclizine (ANTIVERT) 25 MG tablet Take 25 mg by mouth daily.    Yes Historical Provider, MD  Melatonin 3 MG TABS Take 3 mg by mouth at bedtime.    Yes Historical Provider, MD  metoprolol tartrate (LOPRESSOR) 25 MG tablet Take 12.5 mg by mouth daily.    Yes Historical Provider, MD  Multiple Vitamin (MULTIVITAMIN WITH MINERALS) TABS Take 1 tablet by mouth every evening.   Yes Historical Provider, MD  mupirocin ointment (BACTROBAN) 2 % Apply 1 application topically 3 (three) times daily. (at 6am, noon and 6pm). 03/08/14  Yes Historical Provider, MD  nitroGLYCERIN (NITROSTAT) 0.4 MG SL tablet Place 0.4 mg under the tongue every 5 (five) minutes as needed for chest pain.   Yes Historical Provider, MD  ondansetron (ZOFRAN) 8 MG tablet Take 8 mg by mouth every 8 (eight) hours as needed for nausea or vomiting.   Yes Historical Provider, MD  potassium chloride SA (K-DUR,KLOR-CON) 20 MEQ tablet Take 20 mEq by mouth 2 (two) times daily.  08/07/13 08/07/14 Yes Historical Provider, MD  predniSONE (DELTASONE) 5 MG tablet Take 5 mg by mouth daily. 10/28/13  Yes Historical Provider, MD  senna-docusate (RA SENNA PLUS) 8.6-50 MG per tablet Take 1 tablet by mouth at bedtime.  08/30/13  Yes Historical Provider, MD  tiotropium (SPIRIVA) 18 MCG inhalation capsule Place 18 mcg into inhaler and inhale daily.   Yes Historical Provider, MD  OXYGEN-HELIUM IN Inhale 2 % into the lungs daily. daily as needed. Frequency:PHARMDIR   Dosage:0.0      Instructions:  Note:Diagnosis: COPD HT:5'9 WT: 89KG Home O2 @ 2LPM via [place], as needed to keep O2 >92%. Concentrator, supplies, & Portable tank for transport. Sats on RA @ rest 06/09/12   Historical Provider, MD   Physical Exam: Filed Vitals:   03/15/14 0430  BP: 120/60  Pulse: 76  Temp:   Resp: 18    BP 120/60 mmHg  Pulse 76  Temp(Src) 97.7 F (36.5 C) (Oral)  Resp 18  Ht 5\' 9"  (1.753 m)  Wt 73.029 kg (161 lb)  BMI 23.76 kg/m2  SpO2 99%  General Appearance:    Alert, oriented, no distress, appears stated age  Head:    Normocephalic, atraumatic  Eyes:    PERRL, EOMI, sclera non-icteric        Nose:   Nares without drainage or epistaxis. Mucosa, turbinates normal  Throat:   Moist mucous membranes. Oropharynx without erythema or exudate.  Neck:   Supple. No carotid bruits.  No thyromegaly.  No lymphadenopathy.   Back:     No CVA tenderness, no spinal tenderness  Lungs:     Clear to auscultation bilaterally, without wheezes, rhonchi or rales  Chest wall:    No tenderness to palpitation  Heart:    Regular rate and rhythm without murmurs, gallops, rubs  Abdomen:     Soft, non-tender, nondistended, normal bowel sounds, no organomegaly  Genitalia:    deferred  Rectal:    deferred  Extremities:   No clubbing, cyanosis or edema.  Pulses:   2+ and symmetric all extremities  Skin:   Skin color, texture, turgor normal, no rashes or lesions  Lymph nodes:   Cervical, supraclavicular, and axillary nodes normal  Neurologic:   CNII-XII intact. Normal strength, sensation and reflexes      throughout    Labs on Admission:  Basic Metabolic Panel:  Recent Labs Lab 03/15/14 0255  NA 138  K 4.4  CL 100  CO2 29  GLUCOSE 90  BUN 19  CREATININE 0.94  CALCIUM 10.1   Liver Function Tests: No results for input(s): AST, ALT, ALKPHOS, BILITOT, PROT, ALBUMIN in the last 168 hours. No results for input(s): LIPASE, AMYLASE in the last 168 hours. No results for input(s): AMMONIA in the last 168  hours. CBC:  Recent Labs Lab 03/15/14 0255  WBC 6.9  NEUTROABS 4.7  HGB 10.3*  HCT 33.8*  MCV 77.0*  PLT 176   Cardiac Enzymes: No results for input(s): CKTOTAL, CKMB, CKMBINDEX, TROPONINI in the last 168 hours.  BNP (last 3 results)  Recent Labs  05/04/13 1930 07/22/13 1045 12/31/13 0851  PROBNP 604.2* 758.8* 970.5*   CBG: No results for input(s): GLUCAP in the last 168 hours.  Radiological Exams on Admission: Ct Angio Chest Pe W/cm &/or Wo Cm  03/15/2014   CLINICAL DATA:  Left-sided chest pain radiating at 1 o'clock this morning. History of metastatic melanoma.  EXAM: CT ANGIOGRAPHY CHEST WITH CONTRAST  TECHNIQUE: Multidetector CT imaging of the chest was performed using the standard protocol during bolus administration of intravenous contrast. Multiplanar CT image reconstructions and MIPs were obtained to evaluate the vascular anatomy.  CONTRAST:  125mL OMNIPAQUE IOHEXOL 350 MG/ML SOLN  COMPARISON:  01/07/2014  FINDINGS: Technically adequate study with good opacification of the central and segmental pulmonary arteries. No focal filling defects demonstrated. No evidence of significant pulmonary embolus.  Postoperative changes in the mediastinum. Cardiac enlargement. Coronary artery and aortic calcification. Lymph nodes in the mediastinum are not pathologically enlarged. Esophagus is decompressed.  No pleural effusion. No pneumothorax. Mucous with in the trachea and right mainstem bronchus. Diffuse emphysematous changes in the lungs with peripheral fibrosis. Multiple pneumatoceles. Thick-walled pneumatocele in the right lung base posteriorly. This may be infected. Wall thickening is new since previous study. Tiny nodule in the right middle lung is stable  since previous study, measuring less than 4 mm. No suggestion of developing nodule. Nodular opacity seen on plain film may have represented artifact.  Included portions of the upper abdominal organs demonstrate surgical absence of the  gallbladder. Low-attenuation lesion in the left lobe of the liver measures 3.9 by 3.4 cm diameter, unchanged since previous study. Extensive upper abdominal vascular calcifications. Calcified granulomas in the spleen. No destructive bone lesions. Postoperative changes in the left shoulder.  Review of the MIP images confirms the above findings.  IMPRESSION: No evidence of significant pulmonary embolus. Chronic fibrosis and cystic changes in the lungs. No developing pulmonary nodules identified. Left lobe liver lesion is unchanged since previous study.   Electronically Signed   By: Lucienne Capers M.D.   On: 03/15/2014 05:12   Dg Chest Port 1 View  03/15/2014   CLINICAL DATA:  Left-sided chest pain radiating to the back with shortness of breath tonight. History of melanoma.  EXAM: PORTABLE CHEST - 1 VIEW  COMPARISON:  Chest 12/31/2013.  CT chest 01/07/2014.  FINDINGS: Postoperative changes in the mediastinum. Postoperative changes in the left shoulder. Shallow inspiration with elevation of right hemidiaphragm. Linear scarring in the right lung base. Normal heart size and pulmonary vascularity. There is a developing nodule in the right cardiophrenic angle measuring 18 mm diameter. This is suspicious for metastasis. No blunting of costophrenic angles. No pneumothorax.  IMPRESSION: Developing nodule in the right cardiophrenic angle measuring 18 mm, worrisome for metastasis.   Electronically Signed   By: Lucienne Capers M.D.   On: 03/15/2014 03:16    EKG: Independently reviewed.  Assessment/Plan Active Problems:   Chest pain   1. Chest pain - HEART score = 5, CT angio ruled out for PE 1. Chest pain obs pathway 2. NPO for possible stress test 3. Serial trops ordered 4. Known history of CAD with CABG in 1999 5. Chest pain free at this time so heparin GTT not ordered    Code Status: Full Code  Family Communication: No family in room Disposition Plan: Admit to obs   Time spent: 50 min  Blaine Hari,  Avari Gelles M. [facility] Pager [phone]  If 7AM-7PM, please contact the day team taking care of the patient [email or web address] Password [facility] 03/15/2014, 5:54 AM

## 2014-03-15 NOTE — ED Notes (Signed)
Pt arrives from home via [facility]. Pt c/o chest pain that began around 0100 this morning. Pt took nitro x3 prior to EMS arrival. Pt stated he was almost pain free when EMS arrived. EMS gave 324mg  ASA. Pt rated the pain 5/10. Pt has a hx of CABG. Pt stated pain was sharp that radiated to the left side of the back.

## 2014-03-15 NOTE — Discharge Summary (Signed)
PATIENT DETAILS Name: Gabriel Wood Age: 80 y.o. Sex: male Date of Birth: 07/29/1923 MRN: [id number]. Admitting Physician: Etta Quill, DO XKG:YJEHUDJ,SHFWYO R, MD  Admit Date: 03/15/2014 Discharge date: 03/15/2014  Recommendations for Outpatient Follow-up:  1. Chest pain felt to be atypical-deferring further work up for now.  PRIMARY DISCHARGE DIAGNOSIS:  Active Problems:   Chest pain      PAST MEDICAL HISTORY: Past Medical History  Diagnosis Date  . History of colon polyps   . Anemia   . Ulcer     history of Peptic Ulcer Disease  . IBS (irritable bowel syndrome)   . Anxiety   . BPH (benign prostatic hypertrophy)   . CAD (coronary artery disease)   . Atrial fibrillation   . AAA (abdominal aortic aneurysm)   . Chronic anticoagulation     On Coumadin therapy ( A Fib)  . Hypertension   . Hyperlipidemia   . History of DVT of lower extremity     pt does not recall this hx on 07/22/2013  . Anginal pain   . Shortness of breath   . Transient ischemic attack (TIA) 07/2012  . Fall at home Dec. 2014  . CHF (congestive heart failure)   . COPD (chronic obstructive pulmonary disease)   . History of radiation therapy 06/30/13 completed      5/5 srs brain  . Family history of anesthesia complication     "son goes bezerk" (07/22/2013)  . On home oxygen therapy     "2L; w/activity only recently & prn shortness of breath" (07/22/2013)  . H/O hiatal hernia   . Arthritis   . Kidney stone   . Melanoma Sept. 2013    ,Right  posterior shoulder  . Metastatic melanoma     [name] 07/22/2013  . Melanoma of neck     X 2  . Cancer     "somewhere in my abdomen" (07/22/2013)  . GERD (gastroesophageal reflux disease)   . Stroke 2015    "weakness on right side since"  . S/P radiation therapy 06/24/13-06/30/13    Left iliac 20Gy/13fx    DISCHARGE MEDICATIONS: Current Discharge Medication List    CONTINUE these medications which have NOT CHANGED   Details  acetaminophen  (TYLENOL) 650 MG CR tablet Take 1,300 mg by mouth every 8 (eight) hours as needed for pain.    albuterol (PROVENTIL HFA;VENTOLIN HFA) 108 (90 BASE) MCG/ACT inhaler Inhale 2 puffs into the lungs every 6 (six) hours as needed for wheezing or shortness of breath.    aspirin EC 81 MG tablet Take 81 mg by mouth daily.    atorvastatin (LIPITOR) 40 MG tablet Take 40 mg by mouth at bedtime.     budesonide-formoterol (SYMBICORT) 160-4.5 MCG/ACT inhaler Inhale 2 puffs into the lungs 2 (two) times daily.    citalopram (CELEXA) 10 MG tablet Take 10 mg by mouth daily.     famotidine (PEPCID) 20 MG tablet Take 20 mg by mouth daily.    meclizine (ANTIVERT) 25 MG tablet Take 25 mg by mouth daily.     Melatonin 3 MG TABS Take 3 mg by mouth at bedtime.     metoprolol succinate (TOPROL-XL) 25 MG 24 hr tablet Take 12.5 mg by mouth daily.    Multiple Vitamin (MULTIVITAMIN WITH MINERALS) TABS Take 1 tablet by mouth every evening.    mupirocin ointment (BACTROBAN) 2 % Apply 1 application topically 3 (three) times daily. (at 6am, noon and 6pm).    nitroGLYCERIN (NITROSTAT)  0.4 MG SL tablet Place 0.4 mg under the tongue every 5 (five) minutes as needed for chest pain.    ondansetron (ZOFRAN) 8 MG tablet Take 8 mg by mouth every 8 (eight) hours as needed for nausea or vomiting.    potassium chloride SA (K-DUR,KLOR-CON) 20 MEQ tablet Take 20 mEq by mouth 2 (two) times daily.     predniSONE (DELTASONE) 5 MG tablet Take 5 mg by mouth daily.    senna-docusate (RA SENNA PLUS) 8.6-50 MG per tablet Take 1 tablet by mouth at bedtime.     tiotropium (SPIRIVA) 18 MCG inhalation capsule Place 18 mcg into inhaler and inhale daily.    OXYGEN-HELIUM IN Inhale 2 % into the lungs daily. daily as needed. Frequency:PHARMDIR   Dosage:0.0     Instructions:  Note:Diagnosis: COPD HT:5'9 WT: 89KG Home O2 @ 2LPM via [place], as needed to keep O2 >92%. Concentrator, supplies, & Portable tank for transport. Sats on RA @ rest         ALLERGIES:   Allergies  Allergen Reactions  . Morphine And Related Palpitations  . Codeine Nausea Only  . Buprenorphine Hcl Palpitations    BRIEF HPI:  See H&P, Labs, Consult and Test reports for all details in brief, patient was admitted for evaluation of chest pain.  CONSULTATIONS:   cardiology  PERTINENT RADIOLOGIC STUDIES: Ct Angio Chest Pe W/cm &/or Wo Cm  03/15/2014   CLINICAL DATA:  Left-sided chest pain radiating at 1 o'clock this morning. History of metastatic melanoma.  EXAM: CT ANGIOGRAPHY CHEST WITH CONTRAST  TECHNIQUE: Multidetector CT imaging of the chest was performed using the standard protocol during bolus administration of intravenous contrast. Multiplanar CT image reconstructions and MIPs were obtained to evaluate the vascular anatomy.  CONTRAST:  184mL OMNIPAQUE IOHEXOL 350 MG/ML SOLN  COMPARISON:  01/07/2014  FINDINGS: Technically adequate study with good opacification of the central and segmental pulmonary arteries. No focal filling defects demonstrated. No evidence of significant pulmonary embolus.  Postoperative changes in the mediastinum. Cardiac enlargement. Coronary artery and aortic calcification. Lymph nodes in the mediastinum are not pathologically enlarged. Esophagus is decompressed.  No pleural effusion. No pneumothorax. Mucous with in the trachea and right mainstem bronchus. Diffuse emphysematous changes in the lungs with peripheral fibrosis. Multiple pneumatoceles. Thick-walled pneumatocele in the right lung base posteriorly. This may be infected. Wall thickening is new since previous study. Tiny nodule in the right middle lung is stable since previous study, measuring less than 4 mm. No suggestion of developing nodule. Nodular opacity seen on plain film may have represented artifact.  Included portions of the upper abdominal organs demonstrate surgical absence of the gallbladder. Low-attenuation lesion in the left lobe of the liver measures 3.9 by 3.4 cm  diameter, unchanged since previous study. Extensive upper abdominal vascular calcifications. Calcified granulomas in the spleen. No destructive bone lesions. Postoperative changes in the left shoulder.  Review of the MIP images confirms the above findings.  IMPRESSION: No evidence of significant pulmonary embolus. Chronic fibrosis and cystic changes in the lungs. No developing pulmonary nodules identified. Left lobe liver lesion is unchanged since previous study.   Electronically Signed   By: Lucienne Capers M.D.   On: 03/15/2014 05:12   Ct Abdomen Pelvis W Contrast  03/01/2014   CLINICAL DATA:  Restaging metastatic melanoma. Chemotherapy 12/2013.  EXAM: CT ABDOMEN AND PELVIS WITH CONTRAST  TECHNIQUE: Multidetector CT imaging of the abdomen and pelvis was performed using the standard protocol following bolus administration of intravenous contrast.  CONTRAST:  165mL OMNIPAQUE IOHEXOL 300 MG/ML  SOLN  COMPARISON:  01/07/2014  FINDINGS: Cystic structures are again noted in the lungs at the right lung base and in the lingula, stable. COPD changes present. Scarring in the lung bases bilaterally.  Prior CABG. Heart is normal size. Elevation of the right hemidiaphragm. 4 cm cyst in the left hepatic lobe is stable. No new hepatic lesions. Spleen, pancreas, left adrenal are unremarkable. Slight fullness of the right adrenal gland is noted and stable or less pronounced than prior study. This measures approximately 11 mm on today's study.  Bilateral nonobstructing lower pole renal stones. Bilateral renal cysts. Scarring in the lower pole of the right kidney. No hydronephrosis. Previously seen nodular soft tissue inferior to the left kidney is again noted, measuring approximately 11 mm, stable.  Moderate stool burden throughout the colon. Small bowel is decompressed. Aorta is heavily calcified and ectatic, non aneurysmal. No free fluid, free air or adenopathy. Urinary bladder is unremarkable. Prostate is enlarged.  Left  iliac crest mixed lytic and sclerotic lesion is again noted, at L5 with birth defects again noted spondylolisthesis. Degenerative changes throughout the lumbar spine.  IMPRESSION: Stable exam since prior study.  No worsening or new disease.   Electronically Signed   By: Rolm Baptise M.D.   On: 03/01/2014 16:08   Dg Chest Port 1 View  03/15/2014   CLINICAL DATA:  Left-sided chest pain radiating to the back with shortness of breath tonight. History of melanoma.  EXAM: PORTABLE CHEST - 1 VIEW  COMPARISON:  Chest 12/31/2013.  CT chest 01/07/2014.  FINDINGS: Postoperative changes in the mediastinum. Postoperative changes in the left shoulder. Shallow inspiration with elevation of right hemidiaphragm. Linear scarring in the right lung base. Normal heart size and pulmonary vascularity. There is a developing nodule in the right cardiophrenic angle measuring 18 mm diameter. This is suspicious for metastasis. No blunting of costophrenic angles. No pneumothorax.  IMPRESSION: Developing nodule in the right cardiophrenic angle measuring 18 mm, worrisome for metastasis.   Electronically Signed   By: Lucienne Capers M.D.   On: 03/15/2014 03:16     PERTINENT LAB RESULTS: CBC:  Recent Labs  03/15/14 0255  WBC 6.9  HGB 10.3*  HCT 33.8*  PLT 176   CMET CMP     Component Value Date/Time   NA 138 03/15/2014 0255   NA 139 12/30/2013 1156   K 4.4 03/15/2014 0255   K 4.0 12/30/2013 1156   CL 100 03/15/2014 0255   CO2 29 03/15/2014 0255   CO2 26 12/30/2013 1156   GLUCOSE 90 03/15/2014 0255   GLUCOSE 102 12/30/2013 1156   BUN 19 03/15/2014 0255   BUN 18.2 12/30/2013 1156   CREATININE 0.94 03/15/2014 0255   CREATININE 1.1 12/30/2013 1156   CREATININE 1.19 03/06/2011 0945   CALCIUM 10.1 03/15/2014 0255   CALCIUM 9.8 12/30/2013 1156   PROT 6.6 12/31/2013 0851   PROT 6.0* 12/30/2013 1156   ALBUMIN 3.7 12/31/2013 0851   ALBUMIN 3.2* 12/30/2013 1156   AST 16 12/31/2013 0851   AST 13 12/30/2013 1156    ALT 14 12/31/2013 0851   ALT 11 12/30/2013 1156   ALKPHOS 82 12/31/2013 0851   ALKPHOS 77 12/30/2013 1156   BILITOT 1.0 12/31/2013 0851   BILITOT 0.80 12/30/2013 1156   GFRNONAA 72* 03/15/2014 0255   GFRAA 84* 03/15/2014 0255    GFR Estimated Creatinine Clearance: 54.3 mL/min (by C-G formula based on Cr of 0.94). No results for  input(s): LIPASE, AMYLASE in the last 72 hours.  Recent Labs  03/15/14 0604 03/15/14 0810 03/15/14 1130  TROPONINI <0.30 <0.30 <0.30   Invalid input(s): POCBNP No results for input(s): DDIMER in the last 72 hours. No results for input(s): HGBA1C in the last 72 hours. No results for input(s): CHOL, HDL, LDLCALC, TRIG, CHOLHDL, LDLDIRECT in the last 72 hours. No results for input(s): TSH, T4TOTAL, T3FREE, THYROIDAB in the last 72 hours.  Invalid input(s): FREET3 No results for input(s): VITAMINB12, FOLATE, FERRITIN, TIBC, IRON, RETICCTPCT in the last 72 hours. Coags: No results for input(s): INR in the last 72 hours.  Invalid input(s): PT Microbiology: No results found for this or any previous visit (from the past 240 hour(s)).   BRIEF HOSPITAL COURSE: Chest pain: Known history of coronary artery disease and is status post CABG, admitted with left-sided chest pain that has since resolved. EKG without concerning findings, troponins negative.Chest pain felt to be atypical, seen in consultation by Dr. Wynonia Lawman, who recommended no further work up. CTA chest was negative for PE. Continue with aspirin, statins and beta blockers.  History of malignant melanoma: With metastases to lung, brain and bone. He is status post He is status post stereotactic radiotherapy to a solitary brain lesions in addition to palliative radiotherapy to the metastatic bone lesion in the left hip. Undergoing immunotherapy with single agent Nivolumab at the discretion of oncology.  History of COPD: On home O2, continue with Spiriva, budesonide and prednisone. Lungs currently clear and  exam.  History of atrial fibrillation: EKG admission continues to show atrial fibrillation, rate controlled with metoprolol, suspect not long-term anticoagulation candidate. Continue aspirin.  Dyslipidemia: Continue with statin.  Hypertension: Continue with metoprolol. BP appears to be reasonably well controlled.  TODAY-DAY OF DISCHARGE:  Subjective:   Gabriel Wood today has no headache,no chest abdominal pain,no new weakness tingling or numbness, feels much better wants to go home today.   Objective:   Blood pressure 104/62, pulse 86, temperature 97.8 F (36.6 C), temperature source Oral, resp. rate 18, height 5\' 9"  (1.753 m), weight 74.299 kg (163 lb 12.8 oz), SpO2 100 %. No intake or output data in the 24 hours ending 03/15/14 1339 Filed Weights   03/15/14 0234 03/15/14 0657  Weight: 73.029 kg (161 lb) 74.299 kg (163 lb 12.8 oz)    Exam Awake Alert, Oriented *3, No new F.N deficits, Normal affect [address].AT,PERRAL Supple Neck,No JVD, No cervical lymphadenopathy appriciated.  Symmetrical Chest wall movement, Good air movement bilaterally, CTAB RRR,No Gallops,Rubs or new Murmurs, No Parasternal Heave +ve B.Sounds, Abd Soft, Non tender, No organomegaly appriciated, No rebound -guarding or rigidity. No Cyanosis, Clubbing or edema, No new Rash or bruise  DISCHARGE CONDITION: Stable  DISPOSITION: Home  DISCHARGE INSTRUCTIONS:    Activity:  As tolerated   Diet recommendation: Heart Healthy diet   Discharge Instructions    Call MD for:  difficulty breathing, headache or visual disturbances    Complete by:  As directed      Call MD for:  severe uncontrolled pain    Complete by:  As directed      Diet - low sodium heart healthy    Complete by:  As directed      Increase activity slowly    Complete by:  As directed            Follow-up Information    Follow up with Simona Huh, MD. Schedule an appointment as soon as possible for a visit in 1 week.  Specialty:  Family Medicine   Contact information:   [address], [place] [address] [phone]       Follow up with Kerry Hough, MD. Schedule an appointment as soon as possible for a visit in 1 month.   Specialty:  Cardiology   Contact information:   [place] [address] [phone]      Total Time spent on discharge equals 45 minutes.  SignedOren Binet 03/15/2014 1:39 PM

## 2014-03-15 NOTE — Evaluation (Addendum)
Physical Therapy Evaluation Patient Details Name: Gabriel Wood MRN: [id number] DOB: 1923-12-06 Today's Date: 03/15/2014   History of Present Illness  Patient is a 80 y/o male who awoke from sleep this morning with sudden onset of L sided chest pain. Pain is sharp in quality, onset 2 hours ago, radiates to left side of back and left arm.  Pain lasted for 15 mins after he took 3 NTG. Chest pain free in ED. PMH of CAD s/p CABG in 1999, metastatic melanoma and is currently undergoing chemotherapy and chronic A.Fib but was taken off of blood thinners in May 2014.     Clinical Impression  Patient presents with functional limitations due to deficits listed in PT problem list (see below). Pt with balance deficits and drop in Sa02 impacting safe mobility. Education provided on importance of using RW for support during mobility at home to prevent falls. Pt agreeable. Education on doing short bouts of activity at home with rest breaks and on pursed lip breathing to improve ventilation. No chest pain during eval. Pt has 24/7 supervision at home. Pt would benefit from skilled PT to improve transfers, gait and balance so pt can maximize independence and minimize fall risk.    Follow Up Recommendations Supervision/Assistance - 24 hour;No PT follow up    Equipment Recommendations  None recommended by PT    Recommendations for Other Services       Precautions / Restrictions Precautions Precautions: Fall Precaution Comments: monitor 02 Restrictions Weight Bearing Restrictions: No      Mobility  Bed Mobility               General bed mobility comments: Received walking out of bathroom upon PT arrival.   Transfers Overall transfer level: Needs assistance Equipment used: None Transfers: Sit to/from Stand Sit to Stand: Min guard         General transfer comment: Min guard for safety.  Ambulation/Gait Ambulation/Gait assistance: Min guard Ambulation Distance (Feet): 100  Feet Assistive device: None Gait Pattern/deviations: Step-through pattern;Decreased stride length;Drifts right/left;Trunk flexed   Gait velocity interpretation: Below normal speed for age/gender General Gait Details: Pt with mildly unsteady gait. Using rail for support at times. Ambulated without RW. Balance improved with increased distance. No overt LOB. Sa02 dropped to ~84% on RA. Resolves quickly at rest with cues for pursed lip breathing.  Stairs            Wheelchair Mobility    Modified Rankin (Stroke Patients Only)       Balance Overall balance assessment: Needs assistance Sitting-balance support: Feet supported;No upper extremity supported Sitting balance-Leahy Scale: Fair Sitting balance - Comments: Son assisted with donning shoes.    Standing balance support: During functional activity Standing balance-Leahy Scale: Fair Standing balance comment: Able to reach outside BoS washing hands and grabbing towel without LOB.                              Pertinent Vitals/Pain Pain Assessment: No/denies pain    Home Living Family/patient expects to be discharged to:: Private residence Living Arrangements: Children Available Help at Discharge: Family;Available 24 hours/day Type of Home: House Home Access: Stairs to enter Entrance Stairs-Rails: None Entrance Stairs-Number of Steps: 1 Home Layout: One level Home Equipment: Cane - single point;Walker - 2 wheels;Shower seat - built in;Grab bars - tub/shower;Bedside commode;Wheelchair - manual      Prior Function Level of Independence: Independent with assistive device(s)  Comments: Has been using RW vs SPC v furniture walking. Uses w/c for longer distances. Wears 02 at night. No falls reported in last year.     Hand Dominance   Dominant Hand: Right    Extremity/Trunk Assessment   Upper Extremity Assessment: Overall WFL for tasks assessed           Lower Extremity Assessment: Overall  WFL for tasks assessed         Communication   Communication: No difficulties  Cognition Arousal/Alertness: Awake/alert Behavior During Therapy: WFL for tasks assessed/performed Overall Cognitive Status: Within Functional Limits for tasks assessed                      General Comments      Exercises        Assessment/Plan    PT Assessment Patient needs continued PT services  PT Diagnosis Difficulty walking   PT Problem List Cardiopulmonary status limiting activity;Decreased activity tolerance;Decreased balance;Decreased mobility  PT Treatment Interventions Balance training;Gait training;Patient/family education;Functional mobility training;Therapeutic activities;Therapeutic exercise   PT Goals (Current goals can be found in the Care Plan section) Acute Rehab PT Goals Patient Stated Goal: to get home PT Goal Formulation: With patient Time For Goal Achievement: 03/29/14 Potential to Achieve Goals: Good    Frequency Min 3X/week   Barriers to discharge        Co-evaluation               End of Session Equipment Utilized During Treatment: Gait belt Activity Tolerance: Patient tolerated treatment well;Other (comment) (drop in 02.) Patient left: in bed;with call bell/phone within reach;with family/visitor present Nurse Communication: Mobility status    Functional Assessment Tool Used: Clinical judgment Functional Limitation: Mobility: Walking and moving around Mobility: Walking and Moving Around Current Status ([id number]): At least 1 percent but less than 20 percent impaired, limited or restricted Mobility: Walking and Moving Around Goal Status [phone]): At least 1 percent but less than 20 percent impaired, limited or restricted    Time: 1155-1210 PT Time Calculation (min) (ACUTE ONLY): 15 min   Charges:   PT Evaluation $Initial PT Evaluation Tier I: 1 Procedure PT Treatments $Gait Training: 8-22 mins   PT G Codes:   Functional Assessment Tool Used:  Clinical judgment Functional Limitation: Mobility: Walking and moving around    [place], [place] 03/15/2014, 12:17 PM  Candy Sledge, PT, DPT [phone]

## 2014-04-04 ENCOUNTER — Other Ambulatory Visit: Payer: Medicare Other

## 2014-04-06 ENCOUNTER — Ambulatory Visit
Admission: RE | Admit: 2014-04-06 | Discharge: 2014-04-06 | Disposition: A | Discharge status: Home / Self Care | Payer: Medicare Other | Source: Ambulatory Visit | Attending: Radiation Oncology | Admitting: Radiation Oncology

## 2014-04-06 DIAGNOSIS — C7931 Secondary malignant neoplasm of brain: Secondary | ICD-10-CM

## 2014-04-06 MED ORDER — GADOBENATE DIMEGLUMINE 529 MG/ML IV SOLN
15.0000 mL | Freq: Once | INTRAVENOUS | Status: AC | PRN
  Administered 2014-04-06: 15 mL via INTRAVENOUS

## 2014-04-07 ENCOUNTER — Other Ambulatory Visit: Payer: Medicare Other

## 2014-04-10 ENCOUNTER — Encounter: Payer: Self-pay | Admitting: Radiation Therapy

## 2014-04-11 ENCOUNTER — Ambulatory Visit
Admission: RE | Admit: 2014-04-11 | Discharge: 2014-04-11 | Disposition: A | Discharge status: Home / Self Care | Payer: Medicare Other | Source: Ambulatory Visit | Attending: Radiation Oncology | Admitting: Radiation Oncology

## 2014-04-11 ENCOUNTER — Encounter: Payer: Self-pay | Admitting: Radiation Oncology

## 2014-04-11 VITALS — BP 102/46 | HR 71 | Temp 97.6°F | Resp 20 | Ht 69.0 in | Wt 164.8 lb

## 2014-04-11 DIAGNOSIS — C7931 Secondary malignant neoplasm of brain: Secondary | ICD-10-CM

## 2014-04-11 DIAGNOSIS — C7949 Secondary malignant neoplasm of other parts of nervous system: Principal | ICD-10-CM

## 2014-04-11 NOTE — Progress Notes (Signed)
Patient in w/c f/u MR Brain results 04/06/14, no c/o pain, headaches or nausea, is light headed when first getting out of his reckliner in the mornings, standas 2-3 minutes then is able to go with stady gait and dizzy ness subsides, 9:08 AM

## 2014-04-11 NOTE — Progress Notes (Signed)
Radiation Oncology         [phone] ________________________________  Name: Gabriel Wood MRN: [id number]  Date: 04/11/2014  DOB: March 14, 1924  Follow-Up Visit Note  CC: Simona Huh, MD  Gaynelle Arabian, MD  Diagnosis:   Metastatic melanoma with brain metastasis  Interval Since Last Radiation:  10 months   Narrative:  The patient returns today for routine follow-up.  The patient is being seen at the [place] [facility] for systemic treatment. Overall the patient states he has done well. Some dizziness with standing but no major change in this. This tends to quickly resolved. The patient denies any falls. No worsening headaches. The patient had a recent MRI scan of the brain which was reviewed in multidisciplinary brain conference this morning. He returns to clinic today for discussion of this study.                              ALLERGIES:  is allergic to morphine and related; codeine; and buprenorphine hcl.  Meds: Current Outpatient Prescriptions  Medication Sig Dispense Refill  . acetaminophen (TYLENOL) 650 MG CR tablet Take 1,300 mg by mouth every 8 (eight) hours as needed for pain.    Marland Kitchen albuterol (PROVENTIL HFA;VENTOLIN HFA) 108 (90 BASE) MCG/ACT inhaler Inhale 2 puffs into the lungs every 6 (six) hours as needed for wheezing or shortness of breath.    Marland Kitchen aspirin EC 81 MG tablet Take 81 mg by mouth daily.    Marland Kitchen atorvastatin (LIPITOR) 40 MG tablet Take 40 mg by mouth at bedtime.     . budesonide-formoterol (SYMBICORT) 160-4.5 MCG/ACT inhaler Inhale 2 puffs into the lungs 2 (two) times daily.    . citalopram (CELEXA) 10 MG tablet Take 10 mg by mouth daily.     . famotidine (PEPCID) 20 MG tablet Take 20 mg by mouth daily.    . hydrocortisone 2.5 % ointment Apply 1 application topically 2 (two) times daily.    . meclizine (ANTIVERT) 25 MG tablet Take 25 mg by mouth daily.     . Melatonin 3 MG TABS Take 3 mg by mouth at bedtime.     . metoprolol  succinate (TOPROL-XL) 25 MG 24 hr tablet Take 12.5 mg by mouth daily.    . Multiple Vitamin (MULTIVITAMIN WITH MINERALS) TABS Take 1 tablet by mouth every evening.    . mupirocin ointment (BACTROBAN) 2 % Apply 1 application topically 3 (three) times daily. (at 6am, noon and 6pm).    . nitroGLYCERIN (NITROSTAT) 0.4 MG SL tablet Place 0.4 mg under the tongue every 5 (five) minutes as needed for chest pain.    Marland Kitchen ondansetron (ZOFRAN) 8 MG tablet Take 8 mg by mouth every 8 (eight) hours as needed for nausea or vomiting.    . OXYGEN-HELIUM IN Inhale 2 % into the lungs daily. daily as needed. Frequency:PHARMDIR   Dosage:0.0     Instructions:  Note:Diagnosis: COPD HT:5'9 WT: 89KG Home O2 @ 2LPM via [place], as needed to keep O2 >92%. Concentrator, supplies, & Portable tank for transport. Sats on RA @ rest    . predniSONE (DELTASONE) 5 MG tablet Take 5 mg by mouth daily.    Marland Kitchen senna-docusate (RA SENNA PLUS) 8.6-50 MG per tablet Take 1 tablet by mouth at bedtime.     Marland Kitchen tiotropium (SPIRIVA) 18 MCG inhalation capsule Place 18 mcg into inhaler and inhale daily.     No current facility-administered  medications for this encounter.    Physical Findings: The patient is in no acute distress. Patient is alert and oriented.  height is 5\' 9"  (1.753 m) and weight is 164 lb 12.8 oz (74.753 kg). His oral temperature is 97.6 F (36.4 C). His blood pressure is 102/46 and his pulse is 71. His respiration is 20 and oxygen saturation is 89%. .     Lab Findings: Lab Results  Component Value Date   WBC 6.9 03/15/2014   HGB 10.3* 03/15/2014   HCT 33.8* 03/15/2014   MCV 77.0* 03/15/2014   PLT 176 03/15/2014     Radiographic Findings: Ct Angio Chest Pe W/cm &/or Wo Cm  03/15/2014   CLINICAL DATA:  Left-sided chest pain radiating at 1 o'clock this morning. History of metastatic melanoma.  EXAM: CT ANGIOGRAPHY CHEST WITH CONTRAST  TECHNIQUE: Multidetector CT imaging of the chest was performed using the standard protocol  during bolus administration of intravenous contrast. Multiplanar CT image reconstructions and MIPs were obtained to evaluate the vascular anatomy.  CONTRAST:  133mL OMNIPAQUE IOHEXOL 350 MG/ML SOLN  COMPARISON:  01/07/2014  FINDINGS: Technically adequate study with good opacification of the central and segmental pulmonary arteries. No focal filling defects demonstrated. No evidence of significant pulmonary embolus.  Postoperative changes in the mediastinum. Cardiac enlargement. Coronary artery and aortic calcification. Lymph nodes in the mediastinum are not pathologically enlarged. Esophagus is decompressed.  No pleural effusion. No pneumothorax. Mucous with in the trachea and right mainstem bronchus. Diffuse emphysematous changes in the lungs with peripheral fibrosis. Multiple pneumatoceles. Thick-walled pneumatocele in the right lung base posteriorly. This may be infected. Wall thickening is new since previous study. Tiny nodule in the right middle lung is stable since previous study, measuring less than 4 mm. No suggestion of developing nodule. Nodular opacity seen on plain film may have represented artifact.  Included portions of the upper abdominal organs demonstrate surgical absence of the gallbladder. Low-attenuation lesion in the left lobe of the liver measures 3.9 by 3.4 cm diameter, unchanged since previous study. Extensive upper abdominal vascular calcifications. Calcified granulomas in the spleen. No destructive bone lesions. Postoperative changes in the left shoulder.  Review of the MIP images confirms the above findings.  IMPRESSION: No evidence of significant pulmonary embolus. Chronic fibrosis and cystic changes in the lungs. No developing pulmonary nodules identified. Left lobe liver lesion is unchanged since previous study.   Electronically Signed   By: Lucienne Capers M.D.   On: 03/15/2014 05:12   [name] MH Contrast  04/06/2014   CLINICAL DATA:  Melanoma with brain metastases. History of  stereotactic radio surgery.  EXAM: MRI HEAD WITHOUT AND WITH CONTRAST  TECHNIQUE: Multiplanar, multiecho pulse sequences of the brain and surrounding structures were obtained without and with intravenous contrast.  CONTRAST:  23mL MULTIHANCE GADOBENATE DIMEGLUMINE 529 MG/ML IV SOLN  COMPARISON:  MRI 01/04/2014 without contrast. MRI head without and with contrast 09/28/2013  FINDINGS: The area focal enhancement within the left pons has decreased in size, now measuring 7 x 8 mm on the coronal images compared with 11 x 10 mm on the prior study. Enhancement is mostly along the lateral margin of the left upper pons and inferior left cerebral peduncle cerebral peduncle. There is minimal parenchymal enhancement. There is some focal atrophy.  No new enhancement is present to suggest progression of disease elsewhere.  Remote lacunar infarcts of the basal ganglia are stable bilaterally.  Moderate atrophy and diffuse white matter changes are otherwise stable.  Ventricles are proportionate to the degree of atrophy. No significant extraaxial fluid collection is present.  Post radiation changes of the marrow are evident.  Flow is present in the major intracranial arteries. The globes and orbits are intact. The paranasal sinuses and mastoid air cells are clear.  IMPRESSION: 1. Continued regression of a focal area of enhancement along the left lateral margin of the upper pons. 2. No new lesions are evidence for progression of disease. 3. No acute intracranial abnormality. 4. Otherwise stable atrophy and diffuse white matter disease. 5. Remote lacunar infarcts of the basal ganglia bilaterally.   Electronically Signed   By: Lawrence Santiago M.D.   On: 04/06/2014 15:22   Dg Chest Port 1 View  03/15/2014   CLINICAL DATA:  Left-sided chest pain radiating to the back with shortness of breath tonight. History of melanoma.  EXAM: PORTABLE CHEST - 1 VIEW  COMPARISON:  Chest 12/31/2013.  CT chest 01/07/2014.  FINDINGS: Postoperative changes  in the mediastinum. Postoperative changes in the left shoulder. Shallow inspiration with elevation of right hemidiaphragm. Linear scarring in the right lung base. Normal heart size and pulmonary vascularity. There is a developing nodule in the right cardiophrenic angle measuring 18 mm diameter. This is suspicious for metastasis. No blunting of costophrenic angles. No pneumothorax.  IMPRESSION: Developing nodule in the right cardiophrenic angle measuring 18 mm, worrisome for metastasis.   Electronically Signed   By: Lucienne Capers M.D.   On: 03/15/2014 03:16    Impression:    The patient's MRI scan of the brain looked very good. We will continue standard follow-up/observation through our brain program. The patient will [facility].  Plan:  MRI scan of the brain in 3 months, then follow-up appointment  I spent 15 minutes with the patient today, the majority was spent counseling the patient on his diagnosis of cancer and coordinating his care.   Jodelle Gross, M.D., Ph.D.

## 2014-04-22 ENCOUNTER — Encounter: Payer: Self-pay | Admitting: Family

## 2014-04-25 ENCOUNTER — Encounter: Payer: Self-pay | Admitting: Family

## 2014-04-25 ENCOUNTER — Ambulatory Visit (INDEPENDENT_AMBULATORY_CARE_PROVIDER_SITE_OTHER): Payer: Medicare Other | Admitting: Family

## 2014-04-25 ENCOUNTER — Other Ambulatory Visit: Payer: Self-pay | Admitting: Family

## 2014-04-25 ENCOUNTER — Ambulatory Visit (INDEPENDENT_AMBULATORY_CARE_PROVIDER_SITE_OTHER)
Admission: RE | Admit: 2014-04-25 | Discharge: 2014-04-25 | Disposition: A | Discharge status: Home / Self Care | Payer: Medicare Other | Source: Ambulatory Visit | Attending: Family | Admitting: Family

## 2014-04-25 ENCOUNTER — Ambulatory Visit (HOSPITAL_COMMUNITY)
Admission: RE | Admit: 2014-04-25 | Discharge: 2014-04-25 | Disposition: A | Discharge status: Home / Self Care | Payer: Medicare Other | Source: Ambulatory Visit | Attending: Family | Admitting: Family

## 2014-04-25 VITALS — BP 106/69 | HR 89 | Resp 16 | Ht 69.0 in | Wt 162.0 lb

## 2014-04-25 DIAGNOSIS — I6523 Occlusion and stenosis of bilateral carotid arteries: Secondary | ICD-10-CM | POA: Diagnosis not present

## 2014-04-25 DIAGNOSIS — R0989 Other specified symptoms and signs involving the circulatory and respiratory systems: Secondary | ICD-10-CM | POA: Diagnosis not present

## 2014-04-25 DIAGNOSIS — I714 Abdominal aortic aneurysm, without rupture, unspecified: Secondary | ICD-10-CM

## 2014-04-25 DIAGNOSIS — I6529 Occlusion and stenosis of unspecified carotid artery: Secondary | ICD-10-CM

## 2014-04-25 DIAGNOSIS — Z48812 Encounter for surgical aftercare following surgery on the circulatory system: Secondary | ICD-10-CM

## 2014-04-25 NOTE — Progress Notes (Signed)
[facility] [place] HISTORY AND PHYSICAL   MRN : [id number]  History of Present Illness:   Gabriel Wood is a 81 y.o. male patient of Gabriel Wood followed for known small AAA, he returns today for surveillance of this and carotid arteries with history of TIA. Previous studies demonstrate an AAA, measuring 2.8 cm a year ago. The patient does denies back or abdominal pain. The patient is not a smoker. The patient denies claudication in legs with walking but does not seem to walk enough to elicit claudication symptoms. The patient reports history of stroke or TIA symptoms in 2014 in his cardiologist office as manifested by garbled speech, he was evaluated at Gabriel Wood, states he was told that he had a TIA. He was taking Pradaxa for atrial fib, managed by Gabriel Wood, but this has been stopped in 2015, has to do with his brain tumor and decreasing the chance of bleeding from this according to his wife. Had left shoulder surgery in 2014 for a fall in 2013. Pt denies falling in the last year.  Pt Diabetic: No Pt smoker: former smoker, quit in 1998   Pt meds include: Statin :Yes ASA: Yes Other anticoagulants/antiplatelets: no  Current Outpatient Prescriptions  Medication Sig Dispense Refill  . aspirin EC 81 MG tablet Take 81 mg by mouth daily.    Marland Kitchen atorvastatin (LIPITOR) 40 MG tablet Take 40 mg by mouth at bedtime.     . budesonide-formoterol (SYMBICORT) 160-4.5 MCG/ACT inhaler Inhale 2 puffs into the lungs 2 (two) times daily.    . citalopram (CELEXA) 10 MG tablet Take 10 mg by mouth daily.     . famotidine (PEPCID) 20 MG tablet Take 20 mg by mouth daily.    . fluticasone (FLONASE) 50 MCG/ACT nasal spray as needed.  6  . furosemide (LASIX) 20 MG tablet Take 20 mg by mouth as needed.    . hydrocortisone 2.5 % ointment Apply 1 application topically 2 (two) times daily.    . magnesium oxide (MAG-OX) 400 (241.3 MG) MG tablet daily.  3  . meclizine (ANTIVERT) 25 MG  tablet Take 25 mg by mouth daily.     . Melatonin 3 MG TABS Take 3 mg by mouth at bedtime.     . metoprolol succinate (TOPROL-XL) 25 MG 24 hr tablet Take 12.5 mg by mouth daily.    . Multiple Vitamin (MULTIVITAMIN WITH MINERALS) TABS Take 1 tablet by mouth every evening.    . mupirocin ointment (BACTROBAN) 2 % Apply 1 application topically 3 (three) times daily. (at 6am, noon and 6pm).    . nitroGLYCERIN (NITROSTAT) 0.4 MG SL tablet Place 0.4 mg under the tongue every 5 (five) minutes as needed for chest pain.    Marland Kitchen ondansetron (ZOFRAN) 8 MG tablet Take 8 mg by mouth every 8 (eight) hours as needed for nausea or vomiting.    . OXYGEN-HELIUM IN Inhale 2 % into the lungs daily. daily as needed. Frequency:PHARMDIR   Dosage:0.0     Instructions:  Note:Diagnosis: COPD HT:5'9 WT: 89KG Home O2 @ 2LPM via [place], as needed to keep O2 >92%. Concentrator, supplies, & Portable tank for transport. Sats on RA @ rest    . predniSONE (DELTASONE) 5 MG tablet Take 5 mg by mouth daily.    Marland Kitchen senna-docusate (RA SENNA PLUS) 8.6-50 MG per tablet Take 1 tablet by mouth at bedtime.     Marland Kitchen tiotropium (SPIRIVA) 18 MCG inhalation capsule Place 18 mcg into inhaler and inhale  daily.    . acetaminophen (TYLENOL) 650 MG CR tablet Take 1,300 mg by mouth every 8 (eight) hours as needed for pain.    Marland Kitchen albuterol (PROVENTIL HFA;VENTOLIN HFA) 108 (90 BASE) MCG/ACT inhaler Inhale 2 puffs into the lungs every 6 (six) hours as needed for wheezing or shortness of breath.     No current facility-administered medications for this visit.    Past Medical History  Diagnosis Date  . History of colon polyps   . Anemia   . Ulcer     history of Peptic Ulcer Disease  . IBS (irritable bowel syndrome)   . Anxiety   . BPH (benign prostatic hypertrophy)   . CAD (coronary artery disease)   . Atrial fibrillation   . AAA (abdominal aortic aneurysm)   . Chronic anticoagulation     On Coumadin therapy ( A Fib)  . Hypertension   . Hyperlipidemia   .  History of DVT of lower extremity     pt does not recall this hx on 07/22/2013  . Anginal pain   . Transient ischemic attack (TIA) 07/2012  . Fall at home Dec. 2014  . CHF (congestive heart failure)   . COPD (chronic obstructive pulmonary disease)   . History of radiation therapy 06/30/13 completed      5/5 srs brain  . Family history of anesthesia complication     "son goes bezerk" (07/22/2013)  . On home oxygen therapy     "2L; w/activity only recently & prn shortness of breath" (07/22/2013)  . H/O hiatal hernia   . Arthritis   . Kidney stone   . Melanoma Sept. 2013    ,Right  posterior shoulder  . Metastatic melanoma     Gabriel Wood 07/22/2013  . GERD (gastroesophageal reflux disease)   . Stroke 2015    "weakness on right side since"  . S/P radiation therapy 06/24/13-06/30/13    Left iliac 20Gy/54fx    Social History History  Substance Use Topics  . Smoking status: Former Smoker -- 1.00 packs/day for 55 years    Types: Cigarettes    Quit date: 04/08/1996  . Smokeless tobacco: Never Used  . 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 Cancer Mother     stomach cancer  . Heart disease Brother     After age 24  . Cancer Brother     Brain Tumor  . Hyperlipidemia Brother   . Hypertension Brother   . Heart disease Brother   . Hyperlipidemia Brother   . Hypertension Brother   . Heart disease Brother   . Hyperlipidemia Brother   . Hypertension Brother   . Hyperlipidemia Sister   . Cancer Daughter     Colon  . Hyperlipidemia Daughter   . Hyperlipidemia Son     Surgical History Past Surgical History  Procedure Laterality Date  . Appendectomy  1957  . Cholecystectomy  1973  . Shoulder open rotator cuff repair Bilateral     "twice on the right; once on the left"  . Coronary artery bypass graft  1999    CABG X4  . Tibia fracture surgery Left  2011    tibial ORIF by Gabriel Wood  . Ankle fusion Left 2011  . Lymph node biopsy    . Total shoulder replacement Left Dec.  2014  .  Kidney stone surgery      "once"  . Joint replacement    . Cystoscopy w/ stone manipulation      "3-4 times"  . Tonsillectomy  1957  . Melanoma excision Right 05/2011; ~ 12/2011; 2014    shoulder;  neck; neck    Allergies  Allergen Reactions  . Morphine And Related Palpitations  . Codeine Nausea Only  . Buprenorphine Hcl Palpitations    Current Outpatient Prescriptions  Medication Sig Dispense Refill  . aspirin EC 81 MG tablet Take 81 mg by mouth daily.    Marland Kitchen atorvastatin (LIPITOR) 40 MG tablet Take 40 mg by mouth at bedtime.     . budesonide-formoterol (SYMBICORT) 160-4.5 MCG/ACT inhaler Inhale 2 puffs into the lungs 2 (two) times daily.    . citalopram (CELEXA) 10 MG tablet Take 10 mg by mouth daily.     . famotidine (PEPCID) 20 MG tablet Take 20 mg by mouth daily.    . fluticasone (FLONASE) 50 MCG/ACT nasal spray as needed.  6  . furosemide (LASIX) 20 MG tablet Take 20 mg by mouth as needed.    . hydrocortisone 2.5 % ointment Apply 1 application topically 2 (two) times daily.    . magnesium oxide (MAG-OX) 400 (241.3 MG) MG tablet daily.  3  . meclizine (ANTIVERT) 25 MG tablet Take 25 mg by mouth daily.     . Melatonin 3 MG TABS Take 3 mg by mouth at bedtime.     . metoprolol succinate (TOPROL-XL) 25 MG 24 hr tablet Take 12.5 mg by mouth daily.    . Multiple Vitamin (MULTIVITAMIN WITH MINERALS) TABS Take 1 tablet by mouth every evening.    . mupirocin ointment (BACTROBAN) 2 % Apply 1 application topically 3 (three) times daily. (at 6am, noon and 6pm).    . nitroGLYCERIN (NITROSTAT) 0.4 MG SL tablet Place 0.4 mg under the tongue every 5 (five) minutes as needed for chest pain.    Marland Kitchen ondansetron (ZOFRAN) 8 MG tablet Take 8 mg by mouth every 8 (eight) hours as needed for nausea or vomiting.    . OXYGEN-HELIUM IN Inhale 2 % into the lungs daily. daily as  needed. Frequency:PHARMDIR   Dosage:0.0     Instructions:  Note:Diagnosis: COPD HT:5'9 WT: 89KG Home O2 @ 2LPM via [place], as needed to keep O2 >92%. Concentrator, supplies, & Portable tank for transport. Sats on RA @ rest    . predniSONE (DELTASONE) 5 MG tablet Take 5 mg by mouth daily.    Marland Kitchen senna-docusate (RA SENNA PLUS) 8.6-50 MG per tablet Take 1 tablet by mouth at bedtime.     Marland Kitchen tiotropium (SPIRIVA) 18 MCG inhalation capsule Place 18 mcg into inhaler and inhale daily.    Marland Kitchen acetaminophen (TYLENOL) 650 MG CR tablet Take 1,300 mg by mouth every 8 (eight) hours as needed for pain.    Marland Kitchen albuterol (PROVENTIL HFA;VENTOLIN HFA) 108 (90 BASE) MCG/ACT inhaler Inhale 2 puffs into the lungs every 6 (six) hours as needed for wheezing or shortness of breath.     No current facility-administered medications for this visit.     REVIEW OF SYSTEMS: See HPI for pertinent positives and negatives.  Physical Examination Filed Vitals:   04/25/14 1112 04/25/14 1116  BP: 121/78 106/69  Pulse: 72 89  Resp:  16  Height:  5\' 9"  (1.753 m)  Weight:  162 lb (73.483 kg)  SpO2:  95%   Body mass index is 23.91 kg/(m^2).  General: A&O x 3,  WD.  Pulmonary: Sym exp, good air movt, CTAB, no rales, rhonchi, or wheezing.   Cardiac: RRR, Nl S1, S2, no murmur detected.  Carotid Bruits Left Right   Negative Negative  Aorta is is not palpable. Radial pulses are are 2+ and =   VASCULAR EXAM:     LE Pulses LEFT RIGHT   FEMORAL  palpable Faintly palpable    POPLITEAL not palpable  not palpable   POSTERIOR TIBIAL  not palpable  not palpable    DORSALIS PEDIS  ANTERIOR TIBIAL not palpable  not palpable      Gastrointestinal: soft, NTND, -G/R, - HSM, - palpable masses, - CVAT B  Musculoskeletal: M/S 4/5  throughout, Extremities without ischemic changes other than soles of feet are ruddy, dry. Mild bruising around orbits.  Neurologic: CN 2-12 intact except mild deviation of tongue to right , Pain and light touch intact in extremities, Motor exam as listed above.     Non-Invasive Vascular Imaging:   ABDOMINAL AORTA DUPLEX EVALUATION    INDICATION: Abdominal aortic aneurysm    PREVIOUS INTERVENTION(S):     DUPLEX EXAM:     LOCATION DIAMETER AP (cm) DIAMETER TRANSVERSE (cm) VELOCITIES (cm/sec)  Aorta Proximal Not Visualized  Not Visualized    Aorta Mid 2.2 2.2 33  Aorta Distal 2.7 2.7 35  Right Common Iliac Artery 1.1 1.2 85  Left Common Iliac Artery 1.3 1.4 114    Previous max aortic diameter:  2.8cm Date: 04/19/13     ADDITIONAL FINDINGS: . Difficult evaluation/visualization of the abdominal vasculature due to overlying bowel gas and vessel tortuosity. . No hemodynamically significant stenosis of the abdominal aorta and bilateral proximal common iliac arteries.    IMPRESSION: Ectatic dilatation of the distal abdominal aorta with a maximum diameter of 2.7cm, based on decreased visualization, as described above.    Compared to the previous exam:  No significant change in the maximum diameter of the known ectatic distal abdominal aorta when compared to the previous exam.        CEREBROVASCULAR DUPLEX EVALUATION    INDICATION: Carotid disease    PREVIOUS INTERVENTION(S):     DUPLEX EXAM:     RIGHT  LEFT  Peak Systolic Velocities (cm/s) End Diastolic Velocities (cm/s) Plaque LOCATION Peak Systolic Velocities (cm/s) End Diastolic Velocities (cm/s) Plaque  76 15 HT CCA PROXIMAL 95 17   68 14 HT CCA MID 68 11 HT  56 10 HT CCA DISTAL 56 13 HT  213 14 CP ECA 138 11 CP  90 23 CP ICA PROXIMAL 94 26 CP  76 18  ICA MID 56 18   64 17  ICA DISTAL 60 20      ICA / CCA Ratio (PSV)   Antegrade Vertebral Flow Antegrade  854 Brachial Systolic Pressure (mmHg) 627  Multiphasic  (subclavian artery) Brachial Artery Waveforms Multiphasic (subclavian artery)    Plaque Morphology:  HM = Homogeneous, HT = Heterogeneous, CP = Calcific Plaque, SP = Smooth Plaque, IP = Irregular Plaque     ADDITIONAL FINDINGS: . No significant stenosis of the left external or bilateral common carotid arteries. . Right external carotid artery stenosis noted.    IMPRESSION: Doppler velocities suggest less than 40% stenosis of the bilateral proximal internal carotid arteries.    Compared to the previous exam:  No previous duplex exam at this facility available for comparison.         ASSESSMENT:  Gabriel Wood is a 81 y.o. male who returns for  surveillance of a small asymptomatic AAA and carotid arteries with a history of TIA in 2014, no further TIA or stroke activity. Today's AAA Duplex was a difficult evaluation/visualization of the abdominal vasculature due to overlying bowel gas and vessel tortuosity. No hemodynamically significant stenosis of the abdominal aorta and bilateral proximal common iliac arteries. Ectatic dilatation of the distal abdominal aorta with a maximum diameter of 2.7cm, based on decreased visualization. No significant change in the maximum diameter of the known ectatic distal abdominal aorta when compared to the previous exam.  Today's carotid Duplex reveals <40% stenoses of the bilateral internal carotid arteries. No previous duplex exam at this facility available for comparison.  Pedal pulses are not palpable, pt does not seem to walk enough to elicit claudication symptoms, he has not tissue loss but the soles of his feet are ruddy; will check ABI's on his return.  Face to face time with patient was 25 minutes. Over 50% of this time was spent on counseling and coordination of care.   PLAN:   Based on today's exam and non-invasive vascular lab results, the patient will follow up in 1 year with the following tests: AAA Duplex, ABI's and carotid  Duplex.  Stationary bike use daily advised to improve circulation in legs and since he has a history of falling, a graduated walking program may not be advisable.  I discussed in depth with the patient the nature of atherosclerosis, and emphasized the importance of maximal medical management including strict control of blood pressure, blood glucose, and lipid levels, obtaining regular exercise, and continued cessation of smoking.  The patient is aware that without maximal medical management the underlying atherosclerotic disease process will progress, limiting the benefit of any interventions. Consideration for repair of AAA would be made when the size approaches 4.8 or 5.0 cm, growth > 1 cm/yr, and symptomatic status. The patient was Wood information about stroke prevention and what symptoms should prompt the patient to seek immediate medical care. The patient was Wood information about AAA including signs, symptoms, treatment,  what symptoms should prompt the patient to seek immediate medical care, and how to minimize the risk of enlargement and rupture of aneurysms.  Thank you for allowing [place] to participate in this patient's care.  Clemon Chambers, RN, MSN, FNP-C [facility] Office: [phone]  Clinic MD: Trula Wood  04/25/2014 11:19 AM

## 2014-04-25 NOTE — Patient Instructions (Signed)
Abdominal Aortic Aneurysm An aneurysm is a weakened or damaged part of an artery wall that bulges from the normal force of blood pumping through the body. An abdominal aortic aneurysm is an aneurysm that occurs in the lower part of the aorta, the main artery of the body.  The major concern with an abdominal aortic aneurysm is that it can enlarge and burst (rupture) or blood can flow between the layers of the wall of the aorta through a tear (aorticdissection). Both of these conditions can cause bleeding inside the body and can be life threatening unless diagnosed and treated promptly. CAUSES  The exact cause of an abdominal aortic aneurysm is unknown. Some contributing factors are:   A hardening of the arteries caused by the buildup of fat and other substances in the lining of a blood vessel (arteriosclerosis).  Inflammation of the walls of an artery (arteritis).   Connective tissue diseases, such as Marfan syndrome.   Abdominal trauma.   An infection, such as syphilis or staphylococcus, in the wall of the aorta (infectious aortitis) caused by bacteria. RISK FACTORS  Risk factors that contribute to an abdominal aortic aneurysm may include:  Age older than 60 years.   High blood pressure (hypertension).  Male gender.  Ethnicity (white race).  Obesity.  Family history of aneurysm (first degree relatives only).  Tobacco use. PREVENTION  The following healthy lifestyle habits may help decrease your risk of abdominal aortic aneurysm:  Quitting smoking. Smoking can raise your blood pressure and cause arteriosclerosis.  Limiting or avoiding alcohol.  Keeping your blood pressure, blood sugar level, and cholesterol levels within normal limits.  Decreasing your salt intake. In somepeople, too much salt can raise blood pressure and increase your risk of abdominal aortic aneurysm.  Eating a diet low in saturated fats and cholesterol.  Increasing your fiber intake by including  whole grains, vegetables, and fruits in your diet. Eating these foods may help lower blood pressure.  Maintaining a healthy weight.  Staying physically active and exercising regularly. SYMPTOMS  The symptoms of abdominal aortic aneurysm may vary depending on the size and rate of growth of the aneurysm.Most grow slowly and do not have any symptoms. When symptoms do occur, they may include:  Pain (abdomen, side, lower back, or groin). The pain may vary in intensity. A sudden onset of severe pain may indicate that the aneurysm has ruptured.  Feeling full after eating only small amounts of food.  Nausea or vomiting or both.  Feeling a pulsating lump in the abdomen.  Feeling faint or passing out. DIAGNOSIS  Since most unruptured abdominal aortic aneurysms have no symptoms, they are often discovered during diagnostic exams for other conditions. An aneurysm may be found during the following procedures:  Ultrasonography (A one-time screening for abdominal aortic aneurysm by ultrasonography is also recommended for all men aged 65-75 years who have ever smoked).  X-ray exams.  A computed tomography (CT).  Magnetic resonance imaging (MRI).  Angiography or arteriography. TREATMENT  Treatment of an abdominal aortic aneurysm depends on the size of your aneurysm, your age, and risk factors for rupture. Medication to control blood pressure and pain may be used to manage aneurysms smaller than 6 cm. Regular monitoring for enlargement may be recommended by your caregiver if:  The aneurysm is 3-4 cm in size (an annual ultrasonography may be recommended).  The aneurysm is 4-4.5 cm in size (an ultrasonography every 6 months may be recommended).  The aneurysm is larger than 4.5 cm in   size (your caregiver may ask that you be examined by a vascular surgeon). If your aneurysm is larger than 6 cm, surgical repair may be recommended. There are two main methods for repair of an aneurysm:   Endovascular  repair (a minimally invasive surgery). This is done most often.  Open repair. This method is used if an endovascular repair is not possible. Document Released: 01/02/2005 Document Revised: 07/20/2012 Document Reviewed: 04/24/2012 ExitCare Patient Information 2015 [employer]. This information is not intended to replace advice given to you by your health care provider. Make sure you discuss any questions you have with your health care provider.   Stroke Prevention Some medical conditions and behaviors are associated with an increased chance of having a stroke. You may prevent a stroke by making healthy choices and managing medical conditions. HOW CAN I REDUCE MY RISK OF HAVING A STROKE?   Stay physically active. Get at least 30 minutes of activity on most or all days.  Do not smoke. It may also be helpful to avoid exposure to secondhand smoke.  Limit alcohol use. Moderate alcohol use is considered to be:  No more than 2 drinks per day for men.  No more than 1 drink per day for nonpregnant women.  Eat healthy foods. This involves:  Eating 5 or more servings of fruits and vegetables a day.  Making dietary changes that address high blood pressure (hypertension), high cholesterol, diabetes, or obesity.  Manage your cholesterol levels.  Making food choices that are high in fiber and low in saturated fat, trans fat, and cholesterol may control cholesterol levels.  Take any prescribed medicines to control cholesterol as directed by your health care provider.  Manage your diabetes.  Controlling your carbohydrate and sugar intake is recommended to manage diabetes.  Take any prescribed medicines to control diabetes as directed by your health care provider.  Control your hypertension.  Making food choices that are low in salt (sodium), saturated fat, trans fat, and cholesterol is recommended to manage hypertension.  Take any prescribed medicines to control hypertension as directed  by your health care provider.  Maintain a healthy weight.  Reducing calorie intake and making food choices that are low in sodium, saturated fat, trans fat, and cholesterol are recommended to manage weight.  Stop drug abuse.  Avoid taking birth control pills.  Talk to your health care provider about the risks of taking birth control pills if you are over 35 years old, smoke, get migraines, or have ever had a blood clot.  Get evaluated for sleep disorders (sleep apnea).  Talk to your health care provider about getting a sleep evaluation if you snore a lot or have excessive sleepiness.  Take medicines only as directed by your health care provider.  For some people, aspirin or blood thinners (anticoagulants) are helpful in reducing the risk of forming abnormal blood clots that can lead to stroke. If you have the irregular heart rhythm of atrial fibrillation, you should be on a blood thinner unless there is a good reason you cannot take them.  Understand all your medicine instructions.  Make sure that other conditions (such as anemia or atherosclerosis) are addressed. SEEK IMMEDIATE MEDICAL CARE IF:   You have sudden weakness or numbness of the face, arm, or leg, especially on one side of the body.  Your face or eyelid droops to one side.  You have sudden confusion.  You have trouble speaking (aphasia) or understanding.  You have sudden trouble seeing in one or   both eyes.  You have sudden trouble walking.  You have dizziness.  You have a loss of balance or coordination.  You have a sudden, severe headache with no known cause.  You have new chest pain or an irregular heartbeat. Any of these symptoms may represent a serious problem that is an emergency. Do not wait to see if the symptoms will go away. Get medical help at once. Call your local emergency services (911 in [place] Do not drive yourself to the hospital. Document Released: 05/02/2004 Document Revised: 08/09/2013  Document Reviewed: 09/25/2012 ExitCare Patient Information 2015 [employer]. This information is not intended to replace advice given to you by your health care provider. Make sure you discuss any questions you have with your health care provider.  

## 2014-05-19 ENCOUNTER — Telehealth: Payer: Self-pay | Admitting: *Deleted

## 2014-05-19 ENCOUNTER — Other Ambulatory Visit: Payer: Self-pay | Admitting: Radiation Therapy

## 2014-05-19 DIAGNOSIS — C7931 Secondary malignant neoplasm of brain: Secondary | ICD-10-CM

## 2014-05-19 NOTE — Telephone Encounter (Signed)
Per Manuela Schwartz in radiation I have scheduled appts for 4/8. She will call the patient

## 2014-05-30 IMAGING — CT CT ANGIO CHEST
2 of 10 series · 17 of 46 positions shown · IV contrast (APPLIED)
Comparison: CT ABD/PELVIS W CM dated 06/16/2013

CLINICAL DATA: Short of breath and weakness. Concern for pulmonary
embolism or aortic dissection. Prior history of melanoma.

EXAM:
CT ANGIOGRAPHY CHEST, ABDOMEN AND PELVIS
TECHNIQUE: Multidetector CT imaging through the chest, abdomen and pelvis was
performed using the standard protocol during bolus administration of
intravenous contrast. Multiplanar reconstructed images and MIPs were
obtained and reviewed to evaluate the vascular anatomy.
CONTRAST:  100mL OMNIPAQUE IOHEXOL 350 MG/ML SOLN

[Series 8: dissection 3.0 i30f 3 · axial · 0.79mm/px · z∈[-496,+47]mm · 14 of 205 slices shown]
[im 12/205  lung]
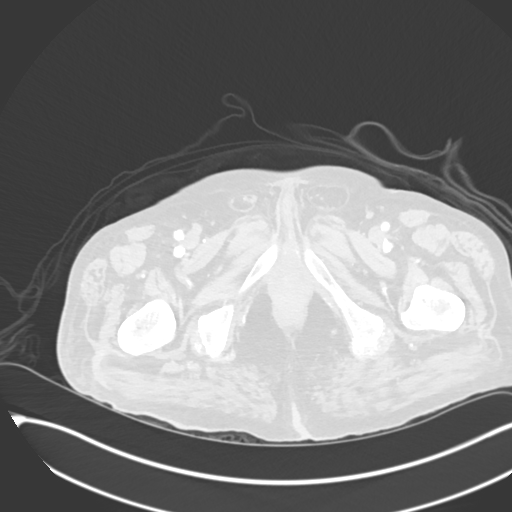
[im 23/205  soft-tissue]
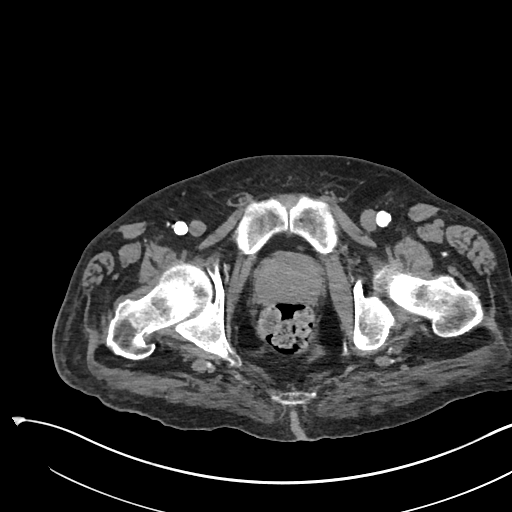
[im 46/205  lung]
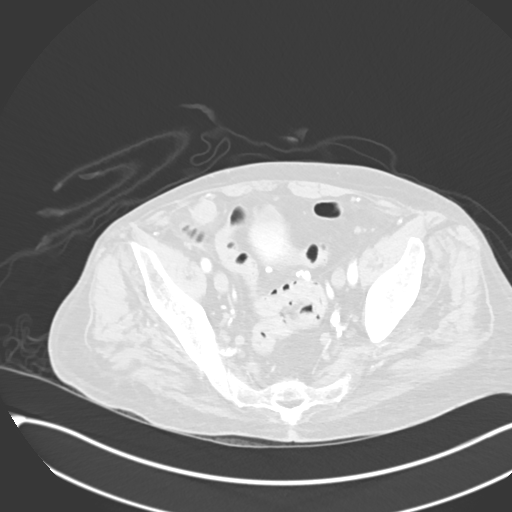
[im 57/205  soft-tissue]
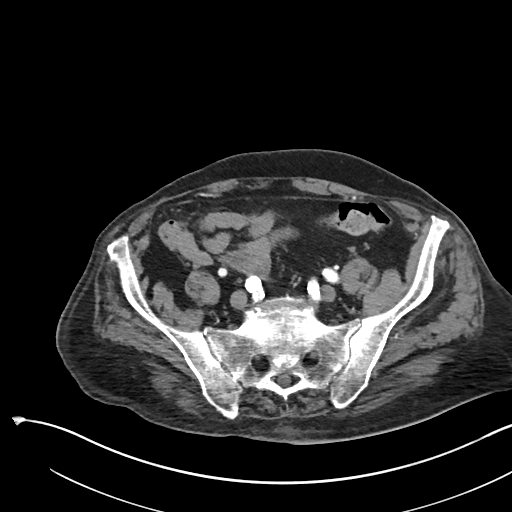
[im 69/205  lung]
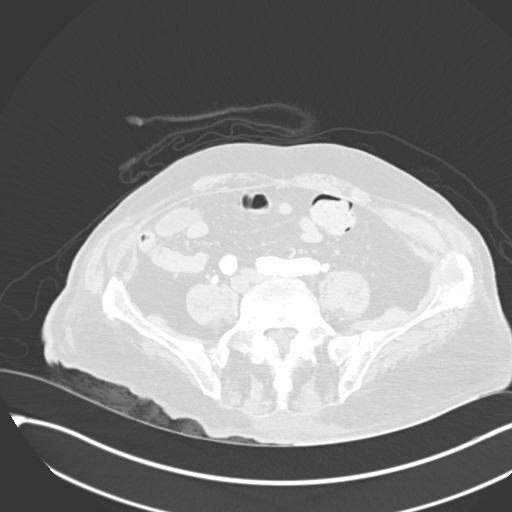
[im 80/205  soft-tissue]
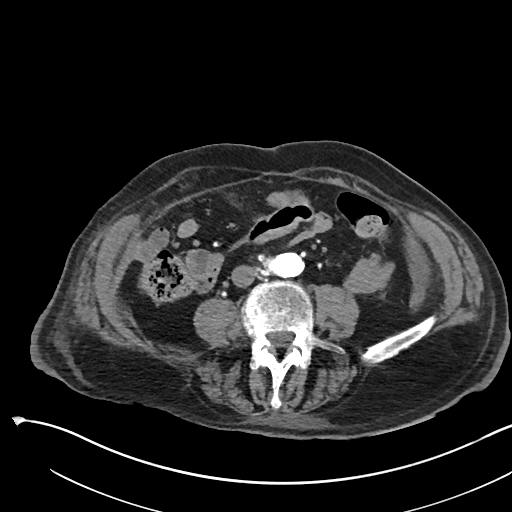
[im 91/205  lung]
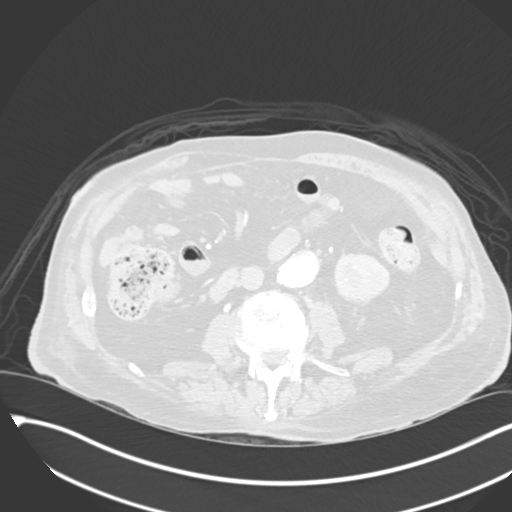
[im 114/205  soft-tissue]
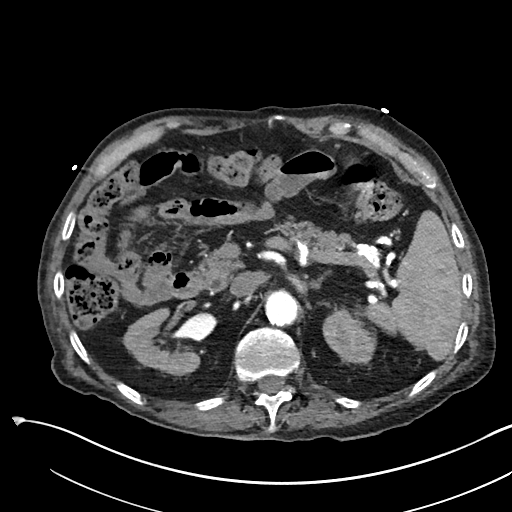
[im 125/205  lung]
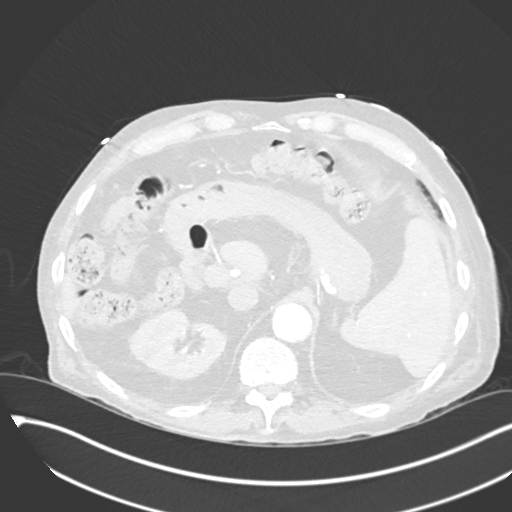
[im 137/205  soft-tissue]
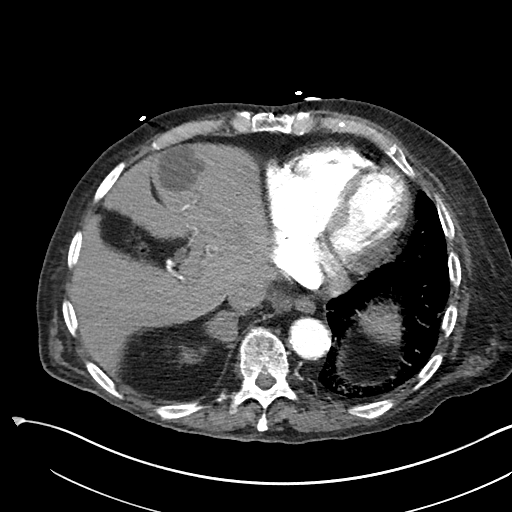
[im 148/205  lung]
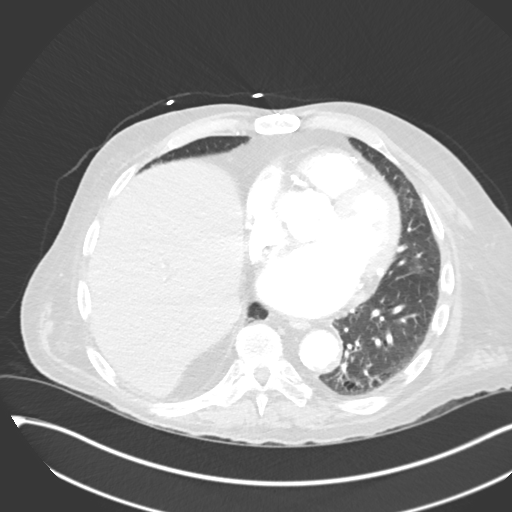
[im 159/205  soft-tissue]
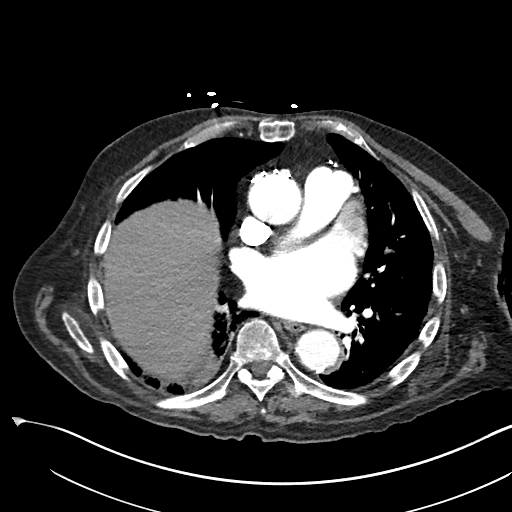
[im 182/205  lung]
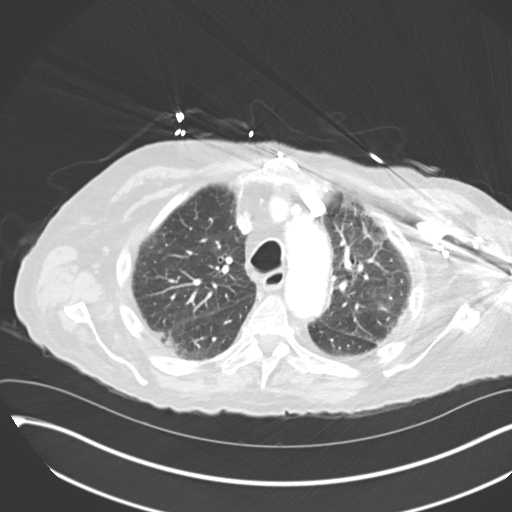
[im 193/205  soft-tissue]
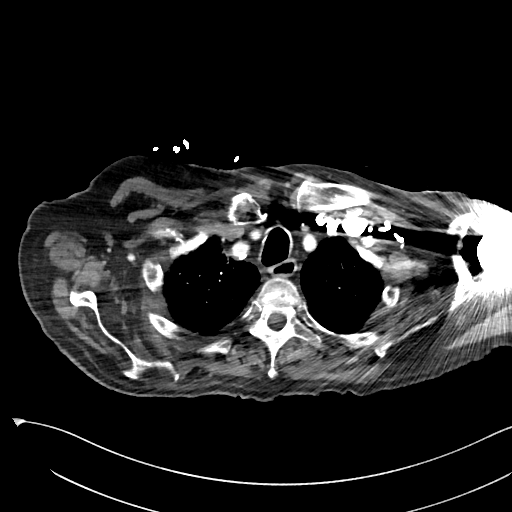

[Series 11: coronals · coronal · 0.93mm/px · 3 of 121 slices shown]
[im 31/121  soft-tissue]
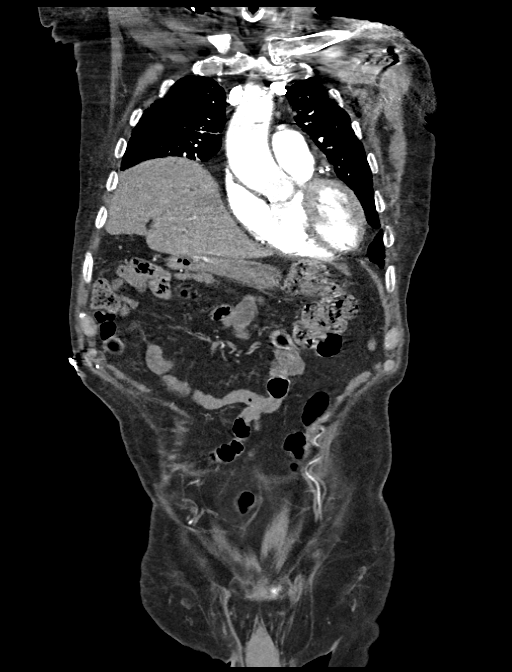
[im 61/121  soft-tissue]
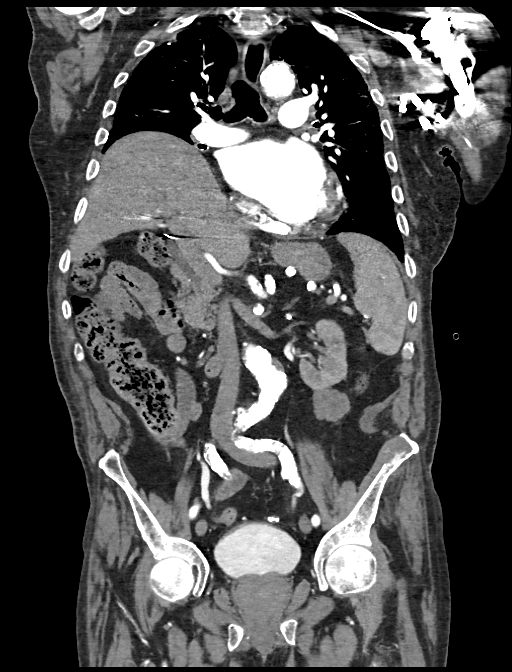
[im 91/121  soft-tissue]
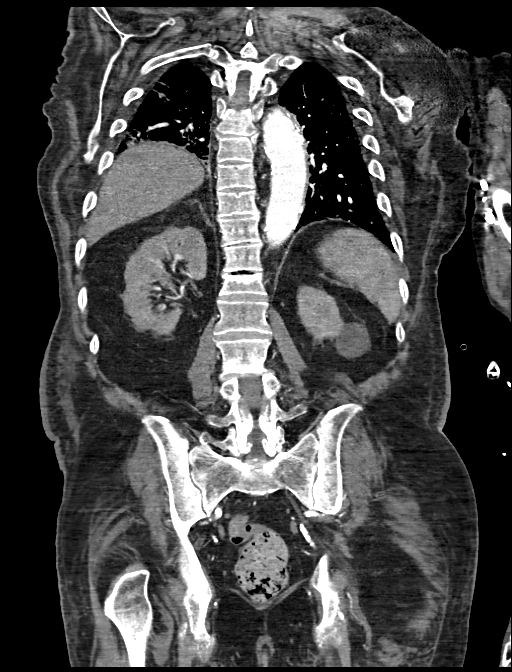

[17 of 46 positions shown; findings below may reference images not displayed]

FINDINGS: CTA CHEST:

No filling defects within the pulmonary arteries to suggest
pulmonary embolism. The right lower lobe is severely atelectatic
suggesting prior surgery.

There is no evidence of dissection of the aorta thoracic aorta.
Great vessels are normal. There is extensive intimal calcification
of the aorta. Consider coronary calcification. There is a coronary
bypass graft anatomy.

Cardiac fluid. Esophagus is normal. No mediastinal lymphadenopathy.
No axillary or supraclavicular adenopathy.

Scattered ground-glass opacities in the lungs and paraseptal
emphysema. Again there is volume loss of the right lower lobe. There
is the thick-walled cavitary lesion in the pleural space on the
right base which is unchanged prior. Stable back to 1901.

Review of the MIP images confirms the above findings.

CTA ABDOMEN AND PELVIS FINDINGS

No evidence of dissection or aneurysm abdominal aorta or iliac
arteries. Heavy intimal calcification is present. SMA and celiac
trunk is are patent and heavily calcified. Renal arteries are
patent.

Low-density lesion in the right hepatic lobe is unchanged. Post
cholecystectomy. The pancreas, spleen, left adrenal gland are
normal. There is nodular enlargement of the right adrenal gland
again demonstrated measuring 33 x 25 mm compared to 30 x 22 mm on
prior.

Stomach, small bowel, and colon are unremarkable.

No retroperitoneal lymphadenopathy. There is a lobular mass
extending from the lower pole of the left kidney which likely
represents a retroperitoneal metastasis measuring 4.2 x 3.3 cm
slightly incerased from 4.0 x 2.8 cm on most recent exam. Small
retroperitoneal implant on the right measures 7 mm unchanged.

The prostate gland and bladder are unchanged. Prostate hypertrophy
noted. Bilateral inguinal hernias. No aggressive osseous lesion.

Review of the MIP images confirms the above findings.
IMPRESSION: 1. No evidence of pulmonary embolism.
2. No evidence of aortic dissection.
3. Stable chronic changes within the lungs including volume loss,
atelectasis ground-glass opacities.
4. Interval increase size of retroperitoneal metastasis inferior to
the left kidney as well as mild increase in right adrenal
metastasis. These findings are compared to 06/16/2013.

## 2014-06-24 ENCOUNTER — Other Ambulatory Visit (HOSPITAL_COMMUNITY): Payer: Self-pay | Admitting: Specialist

## 2014-06-24 DIAGNOSIS — C439 Malignant melanoma of skin, unspecified: Secondary | ICD-10-CM

## 2014-06-27 ENCOUNTER — Ambulatory Visit (HOSPITAL_COMMUNITY)
Admission: RE | Admit: 2014-06-27 | Discharge: 2014-06-27 | Disposition: A | Discharge status: Home / Self Care | Payer: Medicare Other | Source: Ambulatory Visit | Attending: Specialist | Admitting: Specialist

## 2014-06-27 ENCOUNTER — Encounter (HOSPITAL_COMMUNITY): Payer: Self-pay

## 2014-06-27 DIAGNOSIS — C439 Malignant melanoma of skin, unspecified: Secondary | ICD-10-CM | POA: Insufficient documentation

## 2014-06-27 MED ORDER — IOHEXOL 300 MG/ML  SOLN
100.0000 mL | Freq: Once | INTRAMUSCULAR | Status: AC | PRN
  Administered 2014-06-27: 100 mL via INTRAVENOUS

## 2014-07-14 ENCOUNTER — Other Ambulatory Visit: Payer: Self-pay | Admitting: Medical Oncology

## 2014-07-15 ENCOUNTER — Other Ambulatory Visit: Payer: Self-pay | Admitting: Medical Oncology

## 2014-07-15 ENCOUNTER — Ambulatory Visit: Payer: Medicare Other

## 2014-07-15 ENCOUNTER — Ambulatory Visit
Admission: RE | Admit: 2014-07-15 | Discharge: 2014-07-15 | Disposition: A | Discharge status: Home / Self Care | Payer: Medicare Other | Source: Ambulatory Visit | Attending: Radiation Oncology | Admitting: Radiation Oncology

## 2014-07-15 ENCOUNTER — Other Ambulatory Visit (HOSPITAL_BASED_OUTPATIENT_CLINIC_OR_DEPARTMENT_OTHER): Payer: Medicare Other

## 2014-07-15 DIAGNOSIS — C439 Malignant melanoma of skin, unspecified: Secondary | ICD-10-CM

## 2014-07-15 DIAGNOSIS — C7931 Secondary malignant neoplasm of brain: Secondary | ICD-10-CM

## 2014-07-15 DIAGNOSIS — Z79899 Other long term (current) drug therapy: Secondary | ICD-10-CM

## 2014-07-15 DIAGNOSIS — E349 Endocrine disorder, unspecified: Secondary | ICD-10-CM

## 2014-07-15 DIAGNOSIS — C799 Secondary malignant neoplasm of unspecified site: Secondary | ICD-10-CM

## 2014-07-15 LAB — COMPREHENSIVE METABOLIC PANEL (CC13)
ALBUMIN: 3.3 g/dL — AB (ref 3.5–5.0)
ALT: 16 U/L (ref 0–55)
AST: 15 U/L (ref 5–34)
Alkaline Phosphatase: 68 U/L (ref 40–150)
Anion Gap: 10 mEq/L (ref 3–11)
BUN: 18 mg/dL (ref 7.0–26.0)
CHLORIDE: 103 meq/L (ref 98–109)
CO2: 26 meq/L (ref 22–29)
CREATININE: 1 mg/dL (ref 0.7–1.3)
Calcium: 9.2 mg/dL (ref 8.4–10.4)
EGFR: 64 mL/min/{1.73_m2} — ABNORMAL LOW (ref >90)
Glucose: 89 mg/dl (ref 70–140)
Potassium: 5.1 mEq/L (ref 3.5–5.1)
Sodium: 139 mEq/L (ref 136–145)
TOTAL PROTEIN: 6 g/dL — AB (ref 6.4–8.3)
Total Bilirubin: 0.72 mg/dL (ref 0.20–1.20)

## 2014-07-15 LAB — CBC WITH DIFFERENTIAL/PLATELET
BASO%: 0.1 % (ref 0.0–2.0)
Basophils Absolute: 0 10*3/uL (ref 0.0–0.1)
EOS%: 0.6 % (ref 0.0–7.0)
Eosinophils Absolute: 0 10*3/uL (ref 0.0–0.5)
HEMATOCRIT: 35.1 % — AB (ref 38.4–49.9)
HGB: 10.6 g/dL — ABNORMAL LOW (ref 13.0–17.1)
LYMPH#: 0.4 10*3/uL — AB (ref 0.9–3.3)
LYMPH%: 6.5 % — ABNORMAL LOW (ref 14.0–49.0)
MCH: 22.3 pg — ABNORMAL LOW (ref 27.2–33.4)
MCHC: 30.3 g/dL — AB (ref 32.0–36.0)
MCV: 73.7 fL — ABNORMAL LOW (ref 79.3–98.0)
MONO#: 0.5 10*3/uL (ref 0.1–0.9)
MONO%: 8.2 % (ref 0.0–14.0)
NEUT%: 84.6 % — AB (ref 39.0–75.0)
NEUTROS ABS: 5.6 10*3/uL (ref 1.5–6.5)
Platelets: 164 10*3/uL (ref 140–400)
RBC: 4.77 10*6/uL (ref 4.20–5.82)
RDW: 18.7 % — ABNORMAL HIGH (ref 11.0–14.6)
WBC: 6.6 10*3/uL (ref 4.0–10.3)

## 2014-07-15 LAB — TSH CHCC: TSH: 0.708 m(IU)/L (ref 0.320–4.118)

## 2014-07-15 MED ORDER — GADOBENATE DIMEGLUMINE 529 MG/ML IV SOLN: 15.0000 mL | Freq: Once | INTRAVENOUS | Status: AC | PRN

## 2014-07-18 ENCOUNTER — Encounter: Payer: Self-pay | Admitting: Radiation Oncology

## 2014-07-18 ENCOUNTER — Telehealth: Payer: Self-pay | Admitting: *Deleted

## 2014-07-18 ENCOUNTER — Ambulatory Visit
Admission: RE | Admit: 2014-07-18 | Discharge: 2014-07-18 | Disposition: A | Discharge status: Home / Self Care | Payer: Medicare Other | Source: Ambulatory Visit | Attending: Radiation Oncology | Admitting: Radiation Oncology

## 2014-07-18 VITALS — BP 104/57 | HR 86 | Temp 97.0°F | Resp 22 | Ht 69.0 in | Wt 161.8 lb

## 2014-07-18 DIAGNOSIS — C7951 Secondary malignant neoplasm of bone: Secondary | ICD-10-CM

## 2014-07-18 DIAGNOSIS — C7952 Secondary malignant neoplasm of bone marrow: Principal | ICD-10-CM

## 2014-07-18 NOTE — Progress Notes (Signed)
Follow up s/p SRS brain  06/30/13, had MRI 07/15/14 , here for results, sob, and fatigue, no energy, appetite fair,  No pai or nausea BP 104/57 mmHg  Pulse 86  Temp(Src) 97 F (36.1 C) (Oral)  Resp 22  Ht 5\' 9"  (1.753 m)  Wt 161 lb 12.8 oz (73.392 kg)  BMI 23.88 kg/m2  SpO2 92%

## 2014-07-18 NOTE — Telephone Encounter (Signed)
Called Radiology ext# [phone]  requested CD on MRI on 07/15/14 and ct chest/abd/pelvis 06/27/2014  Spoke with Tedra Coupe, said it would be ready today after noon, called patient home phone no answer, called daughter's home and cell phone left messages that Cd could be picked up today afetr noon or can get tomorrow 9:17 AM

## 2014-07-18 NOTE — Progress Notes (Signed)
Radiation Oncology         [phone] ________________________________  Name: Gabriel Wood MRN: [id number]  Date: 07/18/2014  DOB: [birth year]  Follow-Up Visit Note  CC: Simona Huh, MD  Jovita Gamma, MD  Diagnosis:   Metastatic melanoma with brain metastasis  Interval Since Last Radiation:  13 months   Narrative:  The patient returns today for routine follow-up.  Follow up s/p SRS brain  06/30/13, had MRI 07/15/14 , here for results, SOB, and fatigue, no energy, appetite fair. No pain or nausea. Reports SOB for the past 2 weeks. No changes in vision or balance. Denies headaches. Recently started immunotherapy for melanoma.  BP 104/57 mmHg  Pulse 86  Temp(Src) 97 F (36.1 C) (Oral)  Resp 22  Ht 5\' 9"  (1.753 m)  Wt 161 lb 12.8 oz (73.392 kg)  BMI 23.88 kg/m2  SpO2 92%  ALLERGIES:  is allergic to morphine and related; codeine; and buprenorphine hcl.  Meds: Current Outpatient Prescriptions  Medication Sig Dispense Refill  . albuterol (PROVENTIL HFA;VENTOLIN HFA) 108 (90 BASE) MCG/ACT inhaler Inhale 2 puffs into the lungs every 6 (six) hours as needed for wheezing or shortness of breath.    Marland Kitchen aspirin EC 81 MG tablet Take 81 mg by mouth daily.    Marland Kitchen atorvastatin (LIPITOR) 40 MG tablet Take 40 mg by mouth at bedtime.     . budesonide-formoterol (SYMBICORT) 160-4.5 MCG/ACT inhaler Inhale 2 puffs into the lungs 2 (two) times daily.    . citalopram (CELEXA) 10 MG tablet Take 10 mg by mouth daily.     . famotidine (PEPCID) 20 MG tablet Take 20 mg by mouth daily.    . fluticasone (FLONASE) 50 MCG/ACT nasal spray as needed.  6  . furosemide (LASIX) 20 MG tablet Take 20 mg by mouth as needed.    . hydrocortisone 2.5 % ointment Apply 1 application topically 2 (two) times daily.    . magnesium oxide (MAG-OX) 400 (241.3 MG) MG tablet daily.  3  . meclizine (ANTIVERT) 25 MG tablet Take 25 mg by mouth daily.     . metoprolol succinate (TOPROL-XL) 25 MG 24 hr tablet Take 12.5 mg  by mouth daily.    . Multiple Vitamin (MULTIVITAMIN WITH MINERALS) TABS Take 1 tablet by mouth every evening.    Marland Kitchen omeprazole (PRILOSEC) 40 MG capsule Take 40 mg by mouth daily.    . OXYGEN-HELIUM IN Inhale 2 % into the lungs daily. daily as needed. Frequency:PHARMDIR   Dosage:0.0     Instructions:  Note:Diagnosis: COPD HT:5'9 WT: 89KG Home O2 @ 2LPM via [place], as needed to keep O2 >92%. Concentrator, supplies, & Portable tank for transport. Sats on RA @ rest    . predniSONE (DELTASONE) 5 MG tablet Take 5 mg by mouth daily.    Marland Kitchen senna-docusate (RA SENNA PLUS) 8.6-50 MG per tablet Take 1 tablet by mouth at bedtime.     Marland Kitchen tiotropium (SPIRIVA) 18 MCG inhalation capsule Place 18 mcg into inhaler and inhale daily.    Marland Kitchen acetaminophen (TYLENOL) 650 MG CR tablet Take 1,300 mg by mouth every 8 (eight) hours as needed for pain.    . Melatonin 3 MG TABS Take 3 mg by mouth at bedtime.     . nitroGLYCERIN (NITROSTAT) 0.4 MG SL tablet Place 0.4 mg under the tongue every 5 (five) minutes as needed for chest pain.    Marland Kitchen ondansetron (ZOFRAN) 8 MG tablet Take 8 mg by mouth every 8 (eight) hours as  needed for nausea or vomiting.     No current facility-administered medications for this encounter.    Physical Findings: The patient is in no acute distress. Patient is alert and oriented.  height is 5\' 9"  (1.753 m) and weight is 161 lb 12.8 oz (73.392 kg). His oral temperature is 97 F (36.1 C). His blood pressure is 104/57 and his pulse is 86. His respiration is 22 and oxygen saturation is 92%. .     Lab Findings: Lab Results  Component Value Date   WBC 6.6 07/15/2014   HGB 10.6* 07/15/2014   HCT 35.1* 07/15/2014   MCV 73.7* 07/15/2014   PLT 164 07/15/2014     Radiographic Findings: Ct Chest W Contrast  06/27/2014   CLINICAL DATA:  Melanoma, subsequent encounter.  EXAM: CT CHEST, ABDOMEN, AND PELVIS WITH CONTRAST  TECHNIQUE: Multidetector CT imaging of the chest, abdomen and pelvis was performed following  the standard protocol during bolus administration of intravenous contrast.  CONTRAST:  135mL OMNIPAQUE IOHEXOL 300 MG/ML  SOLN  COMPARISON:  CT chest 03/15/2014 and CT abdomen pelvis 03/01/2014.  FINDINGS: CT CHEST FINDINGS  Mediastinum/Nodes: No pathologically enlarged mediastinal, hilar or axillary lymph nodes. Atherosclerotic calcification of the arterial vasculature. Heart is mildly enlarged. No pericardial effusion. Incidental note is made of mild bilateral gynecomastia.  Lungs/Pleura: Scattered pulmonary parenchymal scarring in the right lung, as before. Emphysema is predominantly paraseptal. Scattered small bullous lesions. No pleural fluid. Airway is unremarkable.  Musculoskeletal: No worrisome lytic or sclerotic lesions. Left shoulder arthroplasty.  CT ABDOMEN AND PELVIS FINDINGS  Hepatobiliary: Low-attenuation lesion in the left hepatic lobe measures 3.6 cm, stable. Liver is otherwise unremarkable. Cholecystectomy. No biliary ductal dilatation.  Pancreas: Negative.  Spleen: Negative.  Adrenals/Urinary Tract: 1.2 cm nodule in the body of the right adrenal gland is unchanged. Left adrenal gland is unremarkable. Bilateral renal stones. Low-attenuation lesions in the kidneys measure up to 4.3 cm on the left, stable. Statistically, cysts are most likely. Ureters are decompressed. A 5 mm linear calcification is seen at the right ureterovesical junction (series 2, image 106), new. No associated hydronephrosis. Bladder is otherwise grossly unremarkable.  Stomach/Bowel: Stomach, small bowel and colon are unremarkable. Appendectomy.  Vascular/Lymphatic: Atherosclerotic calcification of the arterial vasculature without abdominal aortic aneurysm. No pathologically enlarged lymph nodes.  Reproductive: Prostate is mildly enlarged.  Other: A soft tissue nodule inferior to the left kidney measures 9 x 12 mm (image 79), stable. No free fluid. Mesenteries and peritoneum are unremarkable.  Musculoskeletal: Mottled sclerosis  in the anterior left iliac wing is unchanged. Advanced degenerative disc disease L5-S1 with grade 1 anterolisthesis. T12 anterior wedge compression deformity is unchanged.  IMPRESSION: 1. Stable right adrenal nodule, left infrarenal soft tissue nodule and left iliac wing lesion, previously shown to represent metastatic disease. No new or progressive disease. 2. New 5 mm linear stone at the right ureterovesical junction, without hydronephrosis. 3. Enlarged prostate. 4. Grade 1 anterolisthesis of L5 on S1 with advanced degenerative disc disease.   Electronically Signed   By: Lorin Picket M.D.   On: 06/27/2014 12:01   [name] EE Contrast  07/15/2014   CLINICAL DATA:  S RS restaging.  Metastatic melanoma.  EXAM: MRI HEAD WITHOUT AND WITH CONTRAST  TECHNIQUE: Multiplanar, multiecho pulse sequences of the brain and surrounding structures were obtained without and with intravenous contrast.  CONTRAST:  15 cc MultiHance  COMPARISON:  04/06/2014  FINDINGS: Diffusion imaging does not show any acute or subacute infarction. Chronic small-vessel  ischemic changes affecting the brainstem, cerebellum, thalami and hemispheric deep white matter appear the same. No hydrocephalus. No extra-axial fluid collection. No intracranial hemorrhage.  There is continued involution of a focus of enhancement in the left side of the pons. Motion degradation prevents precise measurement, but the area of enhancement today is probably no larger than 4 mm. No new lesions are identified.  No calvarial abnormality.  No sinus disease.  No vascular occlusion.  IMPRESSION: Continued favorable response of the left pontine enhancing lesion. The images are considerably degraded by motion, but the area of involvement probably measures no larger than 4 mm today, as opposed to 7 x 8 mm previously. No new or progressive finding.   Electronically Signed   By: Nelson Chimes M.D.   On: 07/15/2014 13:54   Ct Abdomen Pelvis W Contrast  06/27/2014   CLINICAL  DATA:  Melanoma, subsequent encounter.  EXAM: CT CHEST, ABDOMEN, AND PELVIS WITH CONTRAST  TECHNIQUE: Multidetector CT imaging of the chest, abdomen and pelvis was performed following the standard protocol during bolus administration of intravenous contrast.  CONTRAST:  170mL OMNIPAQUE IOHEXOL 300 MG/ML  SOLN  COMPARISON:  CT chest 03/15/2014 and CT abdomen pelvis 03/01/2014.  FINDINGS: CT CHEST FINDINGS  Mediastinum/Nodes: No pathologically enlarged mediastinal, hilar or axillary lymph nodes. Atherosclerotic calcification of the arterial vasculature. Heart is mildly enlarged. No pericardial effusion. Incidental note is made of mild bilateral gynecomastia.  Lungs/Pleura: Scattered pulmonary parenchymal scarring in the right lung, as before. Emphysema is predominantly paraseptal. Scattered small bullous lesions. No pleural fluid. Airway is unremarkable.  Musculoskeletal: No worrisome lytic or sclerotic lesions. Left shoulder arthroplasty.  CT ABDOMEN AND PELVIS FINDINGS  Hepatobiliary: Low-attenuation lesion in the left hepatic lobe measures 3.6 cm, stable. Liver is otherwise unremarkable. Cholecystectomy. No biliary ductal dilatation.  Pancreas: Negative.  Spleen: Negative.  Adrenals/Urinary Tract: 1.2 cm nodule in the body of the right adrenal gland is unchanged. Left adrenal gland is unremarkable. Bilateral renal stones. Low-attenuation lesions in the kidneys measure up to 4.3 cm on the left, stable. Statistically, cysts are most likely. Ureters are decompressed. A 5 mm linear calcification is seen at the right ureterovesical junction (series 2, image 106), new. No associated hydronephrosis. Bladder is otherwise grossly unremarkable.  Stomach/Bowel: Stomach, small bowel and colon are unremarkable. Appendectomy.  Vascular/Lymphatic: Atherosclerotic calcification of the arterial vasculature without abdominal aortic aneurysm. No pathologically enlarged lymph nodes.  Reproductive: Prostate is mildly enlarged.  Other:  A soft tissue nodule inferior to the left kidney measures 9 x 12 mm (image 79), stable. No free fluid. Mesenteries and peritoneum are unremarkable.  Musculoskeletal: Mottled sclerosis in the anterior left iliac wing is unchanged. Advanced degenerative disc disease L5-S1 with grade 1 anterolisthesis. T12 anterior wedge compression deformity is unchanged.  IMPRESSION: 1. Stable right adrenal nodule, left infrarenal soft tissue nodule and left iliac wing lesion, previously shown to represent metastatic disease. No new or progressive disease. 2. New 5 mm linear stone at the right ureterovesical junction, without hydronephrosis. 3. Enlarged prostate. 4. Grade 1 anterolisthesis of L5 on S1 with advanced degenerative disc disease.   Electronically Signed   By: Lorin Picket M.D.   On: 06/27/2014 12:01    Impression:    The patient's MRI scan of the brain looked very good. We will continue standard follow-up/observation through our brain program. [facility] will [facility].  Plan:  MRI scan of the brain in 3 months, then  follow-up appointment  I spent 15 minutes with the patient today, the majority was spent counseling the patient on his diagnosis of cancer and coordinating his care.   This document serves as a record of services personally performed by Kyung Rudd, MD. It was created on his behalf by Pearlie Oyster, a trained medical scribe. The creation of this record is based on the scribe's personal observations and the provider's statements to them. This document has been checked and approved by the attending provider.      Jodelle Gross, M.D., Ph.D.

## 2014-07-26 ENCOUNTER — Other Ambulatory Visit: Payer: Self-pay | Admitting: Radiation Therapy

## 2014-07-26 DIAGNOSIS — C7931 Secondary malignant neoplasm of brain: Secondary | ICD-10-CM

## 2014-09-11 ENCOUNTER — Observation Stay (HOSPITAL_COMMUNITY)
Admission: EM | Admit: 2014-09-11 | Discharge: 2014-09-12 | Disposition: A | Discharge status: Home / Self Care | Payer: Medicare Other | Attending: Internal Medicine | Admitting: Internal Medicine

## 2014-09-11 ENCOUNTER — Encounter (HOSPITAL_COMMUNITY): Payer: Self-pay | Admitting: Emergency Medicine

## 2014-09-11 ENCOUNTER — Emergency Department (HOSPITAL_COMMUNITY): Payer: Medicare Other

## 2014-09-11 DIAGNOSIS — Z885 Allergy status to narcotic agent status: Secondary | ICD-10-CM | POA: Insufficient documentation

## 2014-09-11 DIAGNOSIS — E8770 Fluid overload, unspecified: Secondary | ICD-10-CM | POA: Diagnosis present

## 2014-09-11 DIAGNOSIS — I4891 Unspecified atrial fibrillation: Secondary | ICD-10-CM | POA: Diagnosis not present

## 2014-09-11 DIAGNOSIS — J9621 Acute and chronic respiratory failure with hypoxia: Principal | ICD-10-CM | POA: Insufficient documentation

## 2014-09-11 DIAGNOSIS — K219 Gastro-esophageal reflux disease without esophagitis: Secondary | ICD-10-CM | POA: Insufficient documentation

## 2014-09-11 DIAGNOSIS — Z923 Personal history of irradiation: Secondary | ICD-10-CM | POA: Insufficient documentation

## 2014-09-11 DIAGNOSIS — Z888 Allergy status to other drugs, medicaments and biological substances status: Secondary | ICD-10-CM | POA: Diagnosis not present

## 2014-09-11 DIAGNOSIS — F419 Anxiety disorder, unspecified: Secondary | ICD-10-CM | POA: Insufficient documentation

## 2014-09-11 DIAGNOSIS — Z951 Presence of aortocoronary bypass graft: Secondary | ICD-10-CM | POA: Diagnosis not present

## 2014-09-11 DIAGNOSIS — Z85820 Personal history of malignant melanoma of skin: Secondary | ICD-10-CM | POA: Diagnosis not present

## 2014-09-11 DIAGNOSIS — Z96612 Presence of left artificial shoulder joint: Secondary | ICD-10-CM | POA: Diagnosis not present

## 2014-09-11 DIAGNOSIS — I714 Abdominal aortic aneurysm, without rupture: Secondary | ICD-10-CM | POA: Insufficient documentation

## 2014-09-11 DIAGNOSIS — I509 Heart failure, unspecified: Secondary | ICD-10-CM | POA: Insufficient documentation

## 2014-09-11 DIAGNOSIS — E785 Hyperlipidemia, unspecified: Secondary | ICD-10-CM | POA: Insufficient documentation

## 2014-09-11 DIAGNOSIS — K589 Irritable bowel syndrome without diarrhea: Secondary | ICD-10-CM | POA: Diagnosis not present

## 2014-09-11 DIAGNOSIS — Z9981 Dependence on supplemental oxygen: Secondary | ICD-10-CM | POA: Diagnosis not present

## 2014-09-11 DIAGNOSIS — Z87442 Personal history of urinary calculi: Secondary | ICD-10-CM | POA: Diagnosis not present

## 2014-09-11 DIAGNOSIS — I10 Essential (primary) hypertension: Secondary | ICD-10-CM | POA: Insufficient documentation

## 2014-09-11 DIAGNOSIS — Z7982 Long term (current) use of aspirin: Secondary | ICD-10-CM | POA: Insufficient documentation

## 2014-09-11 DIAGNOSIS — Z86718 Personal history of other venous thrombosis and embolism: Secondary | ICD-10-CM | POA: Insufficient documentation

## 2014-09-11 DIAGNOSIS — N400 Enlarged prostate without lower urinary tract symptoms: Secondary | ICD-10-CM | POA: Insufficient documentation

## 2014-09-11 DIAGNOSIS — J449 Chronic obstructive pulmonary disease, unspecified: Secondary | ICD-10-CM | POA: Insufficient documentation

## 2014-09-11 DIAGNOSIS — I2510 Atherosclerotic heart disease of native coronary artery without angina pectoris: Secondary | ICD-10-CM | POA: Diagnosis not present

## 2014-09-11 DIAGNOSIS — Z86010 Personal history of colonic polyps: Secondary | ICD-10-CM | POA: Insufficient documentation

## 2014-09-11 DIAGNOSIS — Z7901 Long term (current) use of anticoagulants: Secondary | ICD-10-CM | POA: Diagnosis not present

## 2014-09-11 DIAGNOSIS — Z87891 Personal history of nicotine dependence: Secondary | ICD-10-CM | POA: Insufficient documentation

## 2014-09-11 DIAGNOSIS — R0602 Shortness of breath: Secondary | ICD-10-CM

## 2014-09-11 DIAGNOSIS — Z8673 Personal history of transient ischemic attack (TIA), and cerebral infarction without residual deficits: Secondary | ICD-10-CM | POA: Diagnosis not present

## 2014-09-11 LAB — CBC WITH DIFFERENTIAL/PLATELET
BASOS ABS: 0 10*3/uL (ref 0.0–0.1)
BASOS PCT: 0 % (ref 0–1)
EOS ABS: 0.3 10*3/uL (ref 0.0–0.7)
EOS PCT: 3 % (ref 0–5)
HEMATOCRIT: 33.4 % — AB (ref 39.0–52.0)
Hemoglobin: 10.4 g/dL — ABNORMAL LOW (ref 13.0–17.0)
Lymphocytes Relative: 11 % — ABNORMAL LOW (ref 12–46)
Lymphs Abs: 0.8 10*3/uL (ref 0.7–4.0)
MCH: 22.9 pg — ABNORMAL LOW (ref 26.0–34.0)
MCHC: 31.1 g/dL (ref 30.0–36.0)
MCV: 73.4 fL — ABNORMAL LOW (ref 78.0–100.0)
Monocytes Absolute: 1.5 10*3/uL — ABNORMAL HIGH (ref 0.1–1.0)
Monocytes Relative: 20 % — ABNORMAL HIGH (ref 3–12)
NEUTROS PCT: 66 % (ref 43–77)
Neutro Abs: 4.7 10*3/uL (ref 1.7–7.7)
Platelets: 164 10*3/uL (ref 150–400)
RBC: 4.55 MIL/uL (ref 4.22–5.81)
RDW: 19.5 % — ABNORMAL HIGH (ref 11.5–15.5)
WBC: 7.3 10*3/uL (ref 4.0–10.5)

## 2014-09-11 LAB — I-STAT CG4 LACTIC ACID, ED: Lactic Acid, Venous: 2.91 mmol/L (ref 0.5–2.0)

## 2014-09-11 LAB — I-STAT TROPONIN, ED: Troponin i, poc: 0 ng/mL (ref 0.00–0.08)

## 2014-09-11 MED ORDER — FUROSEMIDE 10 MG/ML IJ SOLN
40.0000 mg | Freq: Once | INTRAMUSCULAR | Status: AC
  Administered 2014-09-11: 40 mg via INTRAVENOUS
  Filled 2014-09-11: qty 4

## 2014-09-11 NOTE — ED Notes (Signed)
Pt is from home, hx of COPD, pt reports increased SOB since this morning. EMS reports pt has the ability to wear 2L 02 at home as needed, pt had nasal cannula on when EMS arrived however 02 was not turned on. EMS states pt's 02 sats were 78%. Pt denies pain. EMS adm 7.5 mg of albuterol and 125 mg of solumedrol.

## 2014-09-11 NOTE — ED Provider Notes (Signed)
CSN: [id number]     Arrival date & time 09/11/14  2255 History   First MD Initiated Contact with Patient 09/11/14 2302     This chart was scribed for Everlene Balls, MD by Forrestine Him, ED Scribe. This patient was seen in room A02C/A02C and the patient's care was started 11:04 PM.   Chief Complaint  Patient presents with  . Shortness of Breath   The history is provided by the patient. No language interpreter was used.    HPI Comments: Mare L Kamp brought in by EMS from home is a 81 y.o. male with a PMHx of CAD, AAA, HTN, hyperlipidemia, CHF, COPD, and stroke who presents to the Emergency Department complaining of constant, ongoing shortness of breath x 5 days; worsened early this morning. Typically pt wears 2 liters of Oxygen at home as needed. Upon EMS arrival, pt had nasal cannula on. However, Oxygen was not turned on.  7.5 mg of albuterol and 125 mg of solumedrol given en route to department. Mr. Jeanbaptiste denies any pain at this time. No recent fever, chills, chest pain, or abdominal pain. Pt currently on Lasix daily. Pt with known allergies to Morphine, Codeine, and Buprenorphine HCL.  Past Medical History  Diagnosis Date  . History of colon polyps   . Anemia   . Ulcer     history of Peptic Ulcer Disease  . IBS (irritable bowel syndrome)   . Anxiety   . BPH (benign prostatic hypertrophy)   . CAD (coronary artery disease)   . Atrial fibrillation   . AAA (abdominal aortic aneurysm)   . Chronic anticoagulation     On Coumadin therapy ( A Fib)  . Hypertension   . Hyperlipidemia   . History of DVT of lower extremity     pt does not recall this hx on 07/22/2013  . Anginal pain   . Transient ischemic attack (TIA) 07/2012  . Fall at home Dec. 2014  . CHF (congestive heart failure)   . COPD (chronic obstructive pulmonary disease)   . History of radiation therapy 06/30/13 completed      5/5 srs brain  . Family history of anesthesia complication     "son goes bezerk" (07/22/2013)   . On home oxygen therapy     "2L; w/activity only recently & prn shortness of breath" (07/22/2013)  . H/O hiatal hernia   . Arthritis   . Kidney stone   . Melanoma Sept. 2013    ,Right  posterior shoulder  . Metastatic melanoma     [name] 07/22/2013  . GERD (gastroesophageal reflux disease)   . Stroke 2015    "weakness on right side since"  . S/P radiation therapy 06/24/13-06/30/13    Left iliac 20Gy/84fx   Past Surgical History  Procedure Laterality Date  . Appendectomy  1957  . Cholecystectomy  1973  . Shoulder open rotator cuff repair Bilateral     "twice on the right; once on the left"  . Coronary artery bypass graft  1999    CABG X4  . Tibia fracture surgery Left 2011    tibial ORIF by Dr. Sharol Given  . Ankle fusion Left 2011  . Lymph node biopsy    . Total shoulder replacement Left Dec.  2014  . Kidney stone surgery      "once"  . Joint replacement    . Cystoscopy w/ stone manipulation      "3-4 times"  . Tonsillectomy  1957  . Melanoma excision Right 05/2011; ~  12/2011; 2014    shoulder;  neck; neck   Family History  Problem Relation Age of Onset  . Cancer Mother     stomach cancer  . Heart disease Brother     After age 35  . Cancer Brother     Brain Tumor  . Hyperlipidemia Brother   . Hypertension Brother   . Heart disease Brother   . Hyperlipidemia Brother   . Hypertension Brother   . Heart disease Brother   . Hyperlipidemia Brother   . Hypertension Brother   . Hyperlipidemia Sister   . Cancer Daughter     Colon  . Hyperlipidemia Daughter   . Hyperlipidemia Son    History  Substance Use Topics  . Smoking status: Former Smoker -- 1.00 packs/day for 55 years    Types: Cigarettes    Quit date: 04/08/1996  . Smokeless tobacco: Never Used  . 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  Respiratory: Positive for shortness of  breath. Negative for cough.   Cardiovascular: Negative for chest pain.  Gastrointestinal: Negative for nausea and vomiting.  Musculoskeletal: Negative for arthralgias.  Skin: Negative for rash.  Neurological: Negative for weakness.  Psychiatric/Behavioral: Negative for confusion.  All other systems reviewed and are negative.     Allergies  Morphine and related; Codeine; and Buprenorphine hcl  Home Medications   Prior to Admission medications   Medication Sig Start Date End Date Taking? Authorizing Provider  acetaminophen (TYLENOL) 650 MG CR tablet Take 1,300 mg by mouth every 8 (eight) hours as needed for pain.    Historical Provider, MD  albuterol (PROVENTIL HFA;VENTOLIN HFA) 108 (90 BASE) MCG/ACT inhaler Inhale 2 puffs into the lungs every 6 (six) hours as needed for wheezing or shortness of breath.    Historical Provider, MD  aspirin EC 81 MG tablet Take 81 mg by mouth daily.    Historical Provider, MD  atorvastatin (LIPITOR) 40 MG tablet Take 40 mg by mouth at bedtime.  02/12/11   Historical Provider, MD  budesonide-formoterol (SYMBICORT) 160-4.5 MCG/ACT inhaler Inhale 2 puffs into the lungs 2 (two) times daily.    Historical Provider, MD  citalopram (CELEXA) 10 MG tablet Take 10 mg by mouth daily.  03/07/11   Historical Provider, MD  famotidine (PEPCID) 20 MG tablet Take 20 mg by mouth daily.    Historical Provider, MD  fluticasone (FLONASE) 50 MCG/ACT nasal spray as needed. 02/18/14   Historical Provider, MD  furosemide (LASIX) 20 MG tablet Take 20 mg by mouth as needed.    Historical Provider, MD  hydrocortisone 2.5 % ointment Apply 1 application topically 2 (two) times daily. 03/24/14 03/24/15  Historical Provider, MD  magnesium oxide (MAG-OX) 400 (241.3 MG) MG tablet daily. 01/27/14   Historical Provider, MD  meclizine (ANTIVERT) 25 MG tablet Take 25 mg by mouth daily.     Historical Provider, MD  Melatonin 3 MG TABS Take 3 mg by mouth at bedtime.     Historical Provider, MD   metoprolol succinate (TOPROL-XL) 25 MG 24 hr tablet Take 12.5 mg by mouth daily.    Historical Provider, MD  Multiple Vitamin (MULTIVITAMIN WITH MINERALS) TABS Take 1 tablet by mouth every evening.    Historical Provider, MD  nitroGLYCERIN (NITROSTAT) 0.4 MG SL tablet Place 0.4 mg under the tongue every 5 (five)  minutes as needed for chest pain.    Historical Provider, MD  omeprazole (PRILOSEC) 40 MG capsule Take 40 mg by mouth daily. 06/29/14 07/29/14  Historical Provider, MD  ondansetron (ZOFRAN) 8 MG tablet Take 8 mg by mouth every 8 (eight) hours as needed for nausea or vomiting.    Historical Provider, MD  OXYGEN-HELIUM IN Inhale 2 % into the lungs daily. daily as needed. Frequency:PHARMDIR   Dosage:0.0     Instructions:  Note:Diagnosis: COPD HT:5'9 WT: 89KG Home O2 @ 2LPM via [place], as needed to keep O2 >92%. Concentrator, supplies, & Portable tank for transport. Sats on RA @ rest 06/09/12   Historical Provider, MD  predniSONE (DELTASONE) 5 MG tablet Take 5 mg by mouth daily. 10/28/13   Historical Provider, MD  senna-docusate (RA SENNA PLUS) 8.6-50 MG per tablet Take 1 tablet by mouth at bedtime.  08/30/13   Historical Provider, MD  tiotropium (SPIRIVA) 18 MCG inhalation capsule Place 18 mcg into inhaler and inhale daily.    Historical Provider, MD   Triage Vitals: BP 107/53 mmHg  Pulse 124  Temp(Src) 97.8 F (36.6 C) (Oral)  Resp 24  SpO2 91%   Physical Exam  Constitutional: He is oriented to person, place, and time. Vital signs are normal. He appears well-developed and well-nourished.  Non-toxic appearance. He does not appear ill. No distress.  HENT:  Head: Normocephalic and atraumatic.  Nose: Nose normal.  Mouth/Throat: Oropharynx is clear and moist. No oropharyngeal exudate.  Eyes: Conjunctivae and EOM are normal. Pupils are equal, round, and reactive to light. No scleral icterus.  Neck: Normal range of motion. Neck supple. No tracheal deviation, no edema, no erythema and normal range of  motion present. No thyroid mass and no thyromegaly present.  Cardiovascular: Regular rhythm, S1 normal, S2 normal, normal heart sounds, intact distal pulses and normal pulses.  Tachycardia present.  Exam reveals no gallop and no friction rub.   No murmur heard. Pulses:      Radial pulses are 2+ on the right side, and 2+ on the left side.       Dorsalis pedis pulses are 2+ on the right side, and 2+ on the left side.  Pulmonary/Chest: Accessory muscle usage present. He is in respiratory distress. He has no wheezes. He has no rhonchi. He has no rales.  Decreased breath sounds on R side Increased work of breathing Tachypnic  Abdominal: Soft. Normal appearance and bowel sounds are normal. He exhibits no distension, no ascites and no mass. There is no hepatosplenomegaly. There is no tenderness. There is no rebound, no guarding and no CVA tenderness.  Musculoskeletal: Normal range of motion. He exhibits edema. He exhibits no tenderness.  2 plus pitting edema bilaterally  Lymphadenopathy:    He has no cervical adenopathy.  Neurological: He is alert and oriented to person, place, and time. He has normal strength. No cranial nerve deficit or sensory deficit.  Skin: Skin is warm, dry and intact. No petechiae and no rash noted. He is not diaphoretic. No erythema. No pallor.  Psychiatric: He has a normal mood and affect. His behavior is normal. Judgment normal.  Nursing note and vitals reviewed.   ED Course  Procedures (including critical care time)  DIAGNOSTIC STUDIES: Oxygen Saturation is 91% on 4 liters, low by my interpretation.    COORDINATION OF CARE: 11:07 PM- Will order CXR, EKG, i-stat troponin, i-stat CG4 lactic acid, CBC, CMP, and BNP. Will give Lasix. Discussed treatment plan with pt at bedside and  pt agreed to plan.     Labs Review Labs Reviewed  CBC WITH DIFFERENTIAL/PLATELET - Abnormal; Notable for the following:    Hemoglobin 10.4 (*)    HCT 33.4 (*)    MCV 73.4 (*)    MCH 22.9  (*)    RDW 19.5 (*)    Lymphocytes Relative 11 (*)    Monocytes Relative 20 (*)    Monocytes Absolute 1.5 (*)    All other components within normal limits  COMPREHENSIVE METABOLIC PANEL - Abnormal; Notable for the following:    Chloride 100 (*)    Glucose, Bld 113 (*)    Total Protein 5.9 (*)    Albumin 3.4 (*)    All other components within normal limits  BRAIN NATRIURETIC PEPTIDE - Abnormal; Notable for the following:    B Natriuretic Peptide 138.9 (*)    All other components within normal limits  I-STAT CG4 LACTIC ACID, ED - Abnormal; Notable for the following:    Lactic Acid, Venous 2.91 (*)    All other components within normal limits  I-STAT TROPOININ, ED  I-STAT CG4 LACTIC ACID, ED    Imaging Review Dg Chest 2 View  09/12/2014   CLINICAL DATA:  Shortness of breath.  EXAM: CHEST  2 VIEW  COMPARISON:  Chest CT 06/27/2014  FINDINGS: There is elevation of right hemidiaphragm with mild adjacent atelectasis. Patient is post median sternotomy. The heart is at the upper limits of normal in size. Mild vascular congestion without overt edema no large pleural effusion or pneumothorax. No confluent airspace disease. Surgical clips in the right neck. Left proximal humeral prosthesis, alignment unchanged from prior CT.  IMPRESSION: 1. Mild vascular congestion. 2. Unchanged elevation of right hemidiaphragm with adjacent atelectasis.   Electronically Signed   By: Jeb Levering M.D.   On: 09/12/2014 00:56   Ct Angio Chest Pe W/cm &/or Wo Cm  09/12/2014   CLINICAL DATA:  Increased shortness of breath for 5 days.  EXAM: CT ANGIOGRAPHY CHEST WITH CONTRAST  TECHNIQUE: Multidetector CT imaging of the chest was performed using the standard protocol during bolus administration of intravenous contrast. Multiplanar CT image reconstructions and MIPs were obtained to evaluate the vascular anatomy.  CONTRAST:  33mL OMNIPAQUE IOHEXOL 350 MG/ML SOLN  COMPARISON:  Chest CT 06/27/2014  FINDINGS: There are no  filling defects within the pulmonary arteries to suggest pulmonary embolus.  Dense atherosclerosis of thoracic aorta without aneurysm. Patient is post CABG. Dense calcification of the native coronary arteries. There is left atrial enlargement that appears similar to prior exam. No mediastinal or hilar adenopathy. No pleural or pericardial effusion. There is elevation of right hemidiaphragm.  Emphysema again seen. There are diffuse ground-glass opacities, left greater than right with smooth septal thickening. Findings suggest pulmonary edema. Atelectasis or scarring at the right lung base adjacent elevated right hemidiaphragm. Atelectasis in the right middle lobe as well.  Low-density lesion in the left hepatic lobe consistent with cyst, unchanged. Unchanged 1.3 cm right adrenal nodule. Dense atherosclerosis of the upper abdominal vasculature. Nonobstructing right renal stone.  Left shoulder arthroplasty unchanged in alignment. The bones are under mineralized. There are no lytic or destructive osseous lesions pneumothorax. Multiple surgical clips in the right neck.  Review of the MIP images confirms the above findings.  IMPRESSION: 1. No pulmonary embolus. 2. Findings suspicious for pulmonary edema. Atypical infection could have a similar appearance but is felt less likely given the smooth septal thickening. 3. Unchanged right adrenal nodule. Unchanged elevation  of right hemidiaphragm.   Electronically Signed   By: Jeb Levering M.D.   On: 09/12/2014 02:12     EKG Interpretation   Date/Time:  Sunday September 11 2014 23:09:43 EDT Ventricular Rate:  124 PR Interval:    QRS Duration: 96 QT Interval:  328 QTC Calculation: 471 R Axis:   80 Text Interpretation:  Atrial fibrillation Ventricular premature complex  Baseline wander in lead(s) V5 V6 Confirmed by Glynn Octave  [id number]) on 09/11/2014 11:25:25 PM      MDM   Final diagnoses:  None    Patient presents to the ED for severe SOB.  He was  found to be 78% on RA prior to EMS applying O2.  He normally is 92% on [facility] however he is currently requiring 4L  to maintain this sat.  DDX includes fluid overload vs. PE with his history of malignancy.  Will obtain CTA for evauation.  He takes lasix prn and he will be given a dose in the ED 40mg  IV.  Bedside ultrasound reveals b lines in the lungs bilaterally.  CTA neg for PE.  This is likely fluid overload.  Spoke with Dr. Alcario Drought who will admit to tele for further management.   Emergency Ultrasound: Limited Thoracic Performed and interpreted by Dr Claudine Mouton Longitudinal view of anterior left and right lung fields in real-time with linear probe. Indication: SOB with hypoxia and edema in legs Findings: positive lung sliding positive B lines Interpretation: no evidence of pneumothorax. Images not electronically archived due to the machine capacity being full.      I personally performed the services described in this documentation, which was scribed in my presence. The recorded information has been reviewed and is accurate.   Everlene Balls, MD 09/12/14 [phone]

## 2014-09-12 ENCOUNTER — Emergency Department (HOSPITAL_COMMUNITY): Payer: Medicare Other

## 2014-09-12 ENCOUNTER — Encounter (HOSPITAL_COMMUNITY): Payer: Self-pay | Admitting: Radiology

## 2014-09-12 ENCOUNTER — Observation Stay (HOSPITAL_COMMUNITY): Payer: Medicare Other

## 2014-09-12 DIAGNOSIS — I10 Essential (primary) hypertension: Secondary | ICD-10-CM | POA: Diagnosis not present

## 2014-09-12 DIAGNOSIS — E8770 Fluid overload, unspecified: Secondary | ICD-10-CM | POA: Diagnosis present

## 2014-09-12 DIAGNOSIS — E8779 Other fluid overload: Secondary | ICD-10-CM | POA: Diagnosis not present

## 2014-09-12 DIAGNOSIS — E785 Hyperlipidemia, unspecified: Secondary | ICD-10-CM

## 2014-09-12 DIAGNOSIS — J9621 Acute and chronic respiratory failure with hypoxia: Secondary | ICD-10-CM

## 2014-09-12 LAB — COMPREHENSIVE METABOLIC PANEL
ALT: 19 U/L (ref 17–63)
ANION GAP: 11 (ref 5–15)
AST: 23 U/L (ref 15–41)
Albumin: 3.4 g/dL — ABNORMAL LOW (ref 3.5–5.0)
Alkaline Phosphatase: 68 U/L (ref 38–126)
BUN: 11 mg/dL (ref 6–20)
CALCIUM: 9.1 mg/dL (ref 8.9–10.3)
CO2: 26 mmol/L (ref 22–32)
CREATININE: 1.04 mg/dL (ref 0.61–1.24)
Chloride: 100 mmol/L — ABNORMAL LOW (ref 101–111)
GFR calc Af Amer: >60 mL/min (ref >60)
GLUCOSE: 113 mg/dL — AB (ref 65–99)
POTASSIUM: 3.9 mmol/L (ref 3.5–5.1)
SODIUM: 137 mmol/L (ref 135–145)
Total Bilirubin: 0.8 mg/dL (ref 0.3–1.2)
Total Protein: 5.9 g/dL — ABNORMAL LOW (ref 6.5–8.1)

## 2014-09-12 LAB — BRAIN NATRIURETIC PEPTIDE: B Natriuretic Peptide: 138.9 pg/mL — ABNORMAL HIGH (ref 0.0–100.0)

## 2014-09-12 LAB — TROPONIN I: Troponin I: <0.03 ng/mL (ref <0.031)

## 2014-09-12 LAB — I-STAT CG4 LACTIC ACID, ED: Lactic Acid, Venous: 1.8 mmol/L (ref 0.5–2.0)

## 2014-09-12 MED ORDER — MAGNESIUM OXIDE 400 (241.3 MG) MG PO TABS
400.0000 mg | ORAL_TABLET | Freq: Every day | ORAL | Status: DC
  Administered 2014-09-12: 400 mg via ORAL
  Filled 2014-09-12: qty 1

## 2014-09-12 MED ORDER — PREDNISONE 5 MG PO TABS
5.0000 mg | ORAL_TABLET | Freq: Every day | ORAL | Status: DC
  Administered 2014-09-12: 5 mg via ORAL
  Filled 2014-09-12 (×2): qty 1

## 2014-09-12 MED ORDER — MECLIZINE HCL 25 MG PO TABS
25.0000 mg | ORAL_TABLET | Freq: Every day | ORAL | Status: DC
  Administered 2014-09-12: 25 mg via ORAL
  Filled 2014-09-12: qty 1

## 2014-09-12 MED ORDER — SODIUM CHLORIDE 0.9 % IJ SOLN: 3.0000 mL | Freq: Two times a day (BID) | INTRAMUSCULAR | Status: DC

## 2014-09-12 MED ORDER — ATORVASTATIN CALCIUM 40 MG PO TABS
40.0000 mg | ORAL_TABLET | Freq: Every day | ORAL | Status: DC
  Administered 2014-09-12: 40 mg via ORAL
  Filled 2014-09-12: qty 1

## 2014-09-12 MED ORDER — SODIUM CHLORIDE 0.9 % IJ SOLN
3.0000 mL | INTRAMUSCULAR | Status: DC | PRN
  Administered 2014-09-12: 3 mL via INTRAVENOUS
  Filled 2014-09-12: qty 3

## 2014-09-12 MED ORDER — TIOTROPIUM BROMIDE MONOHYDRATE 18 MCG IN CAPS
18.0000 ug | ORAL_CAPSULE | Freq: Every day | RESPIRATORY_TRACT | Status: DC
  Administered 2014-09-12: 18 ug via RESPIRATORY_TRACT
  Filled 2014-09-12: qty 5

## 2014-09-12 MED ORDER — ASPIRIN EC 81 MG PO TBEC
81.0000 mg | DELAYED_RELEASE_TABLET | Freq: Every day | ORAL | Status: DC
  Administered 2014-09-12: 81 mg via ORAL
  Filled 2014-09-12: qty 1

## 2014-09-12 MED ORDER — ACETAMINOPHEN 325 MG PO TABS: 650.0000 mg | ORAL_TABLET | ORAL | Status: DC | PRN

## 2014-09-12 MED ORDER — METOPROLOL SUCCINATE 12.5 MG HALF TABLET
12.5000 mg | ORAL_TABLET | Freq: Every day | ORAL | Status: DC
  Administered 2014-09-12: 12.5 mg via ORAL
  Filled 2014-09-12: qty 1

## 2014-09-12 MED ORDER — BUDESONIDE-FORMOTEROL FUMARATE 160-4.5 MCG/ACT IN AERO
2.0000 | INHALATION_SPRAY | Freq: Two times a day (BID) | RESPIRATORY_TRACT | Status: DC
  Administered 2014-09-12: 2 via RESPIRATORY_TRACT
  Filled 2014-09-12: qty 6

## 2014-09-12 MED ORDER — ALBUTEROL SULFATE (2.5 MG/3ML) 0.083% IN NEBU: 2.5000 mg | INHALATION_SOLUTION | Freq: Four times a day (QID) | RESPIRATORY_TRACT | Status: DC | PRN

## 2014-09-12 MED ORDER — HEPARIN SODIUM (PORCINE) 5000 UNIT/ML IJ SOLN: 5000.0000 [IU] | Freq: Three times a day (TID) | INTRAMUSCULAR | Status: DC

## 2014-09-12 MED ORDER — ALBUTEROL SULFATE HFA 108 (90 BASE) MCG/ACT IN AERS: 2.0000 | INHALATION_SPRAY | Freq: Four times a day (QID) | RESPIRATORY_TRACT | Status: DC | PRN

## 2014-09-12 MED ORDER — CITALOPRAM HYDROBROMIDE 10 MG PO TABS
10.0000 mg | ORAL_TABLET | Freq: Every day | ORAL | Status: DC
  Administered 2014-09-12: 10 mg via ORAL
  Filled 2014-09-12: qty 1

## 2014-09-12 MED ORDER — SODIUM CHLORIDE 0.9 % IV SOLN: 250.0000 mL | INTRAVENOUS | Status: DC | PRN

## 2014-09-12 MED ORDER — FAMOTIDINE 20 MG PO TABS
20.0000 mg | ORAL_TABLET | Freq: Every day | ORAL | Status: DC
  Administered 2014-09-12: 20 mg via ORAL
  Filled 2014-09-12: qty 1

## 2014-09-12 MED ORDER — FUROSEMIDE 10 MG/ML IJ SOLN
20.0000 mg | Freq: Every day | INTRAMUSCULAR | Status: DC
  Administered 2014-09-12: 20 mg via INTRAVENOUS
  Filled 2014-09-12: qty 2

## 2014-09-12 MED ORDER — PANTOPRAZOLE SODIUM 40 MG PO TBEC
80.0000 mg | DELAYED_RELEASE_TABLET | Freq: Every day | ORAL | Status: DC
  Administered 2014-09-12: 80 mg via ORAL
  Filled 2014-09-12: qty 2

## 2014-09-12 MED ORDER — ONDANSETRON HCL 4 MG/2ML IJ SOLN
4.0000 mg | Freq: Four times a day (QID) | INTRAMUSCULAR | Status: DC | PRN
Start: 2014-09-12 — End: 2014-09-12

## 2014-09-12 MED ORDER — FLUTICASONE PROPIONATE 50 MCG/ACT NA SUSP
1.0000 | Freq: Every day | NASAL | Status: DC
  Administered 2014-09-12: 1 via NASAL
  Filled 2014-09-12: qty 16

## 2014-09-12 MED ORDER — ONDANSETRON HCL 4 MG PO TABS: 8.0000 mg | ORAL_TABLET | Freq: Three times a day (TID) | ORAL | Status: DC | PRN

## 2014-09-12 MED ORDER — IOHEXOL 350 MG/ML SOLN
75.0000 mL | Freq: Once | INTRAVENOUS | Status: AC | PRN
  Administered 2014-09-12: 75 mL via INTRAVENOUS

## 2014-09-12 NOTE — ED Notes (Signed)
Patient used bedside commode with assistance from [facility] and had BM and urinated- unable to measure. Bedside cleaning needed to be completed due to patient having some BM in bed. Pt insistent on standing to urinate SpO2 dropped to 84%/6L when standing and HR went up to 168 BPM. Pt instructed to sit back down and take slow deep breaths. SpO2 back up to 96%/6L family insisting pt stand up to urinate again RN informed pt and family that pt needed bedrest due to unstable vitals and condom catheter would be applied. Dr. Claudine Mouton made aware.

## 2014-09-12 NOTE — ED Notes (Signed)
Transporting patient to new room assignment. 

## 2014-09-12 NOTE — ED Notes (Signed)
Dr Claudine Mouton given a copy of lactic acid results 2.91

## 2014-09-12 NOTE — H&P (Signed)
[facility] History and Physical  EDIE DARLEY SEG:[id number] DOB: 1923-07-18 DOA: 09/11/2014  Referring physician: EDP PCP: Simona Huh, MD   Chief Complaint: Respiratory distress   HPI: Shane Crutch is a 81 y.o. male h/o COPD on 2L home O2 at baseline, CAD, HTN, CHF, brought in from his NH with c/o constant and ongoing SOB for the past 5 days, worsened earlier this morning.  Patient typically wears 2 L of home O2 as needed at home.  Takes lasix "PRN" but hasnt taken any in a while.  He is now requiring 4L to maintain sats.  Review of Systems: Systems reviewed.  As above, otherwise negative  Past Medical History  Diagnosis Date  . History of colon polyps   . Anemia   . Ulcer     history of Peptic Ulcer Disease  . IBS (irritable bowel syndrome)   . Anxiety   . BPH (benign prostatic hypertrophy)   . CAD (coronary artery disease)   . Atrial fibrillation   . AAA (abdominal aortic aneurysm)   . Chronic anticoagulation     On Coumadin therapy ( A Fib)  . Hypertension   . Hyperlipidemia   . History of DVT of lower extremity     pt does not recall this hx on 07/22/2013  . Anginal pain   . Transient ischemic attack (TIA) 07/2012  . Fall at home Dec. 2014  . CHF (congestive heart failure)   . COPD (chronic obstructive pulmonary disease)   . History of radiation therapy 06/30/13 completed      5/5 srs brain  . Family history of anesthesia complication     "son goes bezerk" (07/22/2013)  . On home oxygen therapy     "2L; w/activity only recently & prn shortness of breath" (07/22/2013)  . H/O hiatal hernia   . Arthritis   . Kidney stone   . Melanoma Sept. 2013    ,Right  posterior shoulder  . Metastatic melanoma     [name] 07/22/2013  . GERD (gastroesophageal reflux disease)   . Stroke 2015    "weakness on right side since"  . S/P radiation therapy 06/24/13-06/30/13    Left iliac 20Gy/79fx   Past Surgical History  Procedure Laterality Date  . Appendectomy   1957  . Cholecystectomy  1973  . Shoulder open rotator cuff repair Bilateral     "twice on the right; once on the left"  . Coronary artery bypass graft  1999    CABG X4  . Tibia fracture surgery Left 2011    tibial ORIF by Dr. Sharol Given  . Ankle fusion Left 2011  . Lymph node biopsy    . Total shoulder replacement Left Dec.  2014  . Kidney stone surgery      "once"  . Joint replacement    . Cystoscopy w/ stone manipulation      "3-4 times"  . Tonsillectomy  1957  . Melanoma excision Right 05/2011; ~ 12/2011; 2014    shoulder;  neck; neck   Social History:  reports that he quit smoking about 18 years ago. His smoking use included Cigarettes. He has a 55 pack-year smoking history. He has never used smokeless tobacco. He reports that he drinks alcohol. He reports that he does not use illicit drugs.  Allergies  Allergen Reactions  . Morphine And Related Palpitations  . Codeine Nausea Only  . Buprenorphine Hcl Palpitations    Family History  Problem Relation Age of Onset  . Cancer Mother  stomach cancer  . Heart disease Brother     After age 55  . Cancer Brother     Brain Tumor  . Hyperlipidemia Brother   . Hypertension Brother   . Heart disease Brother   . Hyperlipidemia Brother   . Hypertension Brother   . Heart disease Brother   . Hyperlipidemia Brother   . Hypertension Brother   . Hyperlipidemia Sister   . Cancer Daughter     Colon  . Hyperlipidemia Daughter   . Hyperlipidemia Son      Prior to Admission medications   Medication Sig Start Date End Date Taking? Authorizing Provider  acetaminophen (TYLENOL) 650 MG CR tablet Take 1,300 mg by mouth every 8 (eight) hours as needed for pain.   Yes Historical Provider, MD  albuterol (PROVENTIL HFA;VENTOLIN HFA) 108 (90 BASE) MCG/ACT inhaler Inhale 2 puffs into the lungs every 6 (six) hours as needed for wheezing or shortness of breath.   Yes Historical Provider, MD  aspirin EC 81 MG tablet Take 81 mg by mouth daily.   Yes  Historical Provider, MD  atorvastatin (LIPITOR) 40 MG tablet Take 40 mg by mouth daily.  02/12/11  Yes Historical Provider, MD  budesonide-formoterol (SYMBICORT) 160-4.5 MCG/ACT inhaler Inhale 2 puffs into the lungs 2 (two) times daily.   Yes Historical Provider, MD  citalopram (CELEXA) 10 MG tablet Take 10 mg by mouth daily.  03/07/11  Yes Historical Provider, MD  famotidine (PEPCID) 20 MG tablet Take 20 mg by mouth daily.   Yes Historical Provider, MD  fluticasone (FLONASE) 50 MCG/ACT nasal spray Place 1 spray into both nostrils daily.  02/18/14  Yes Historical Provider, MD  furosemide (LASIX) 20 MG tablet Take 20 mg by mouth as needed.   Yes Historical Provider, MD  magnesium oxide (MAG-OX) 400 (241.3 MG) MG tablet Take 400 mg by mouth daily.  01/27/14  Yes Historical Provider, MD  meclizine (ANTIVERT) 25 MG tablet Take 25 mg by mouth daily.    Yes Historical Provider, MD  metoprolol succinate (TOPROL-XL) 25 MG 24 hr tablet Take 12.5 mg by mouth daily.   Yes Historical Provider, MD  Multiple Vitamin (MULTIVITAMIN WITH MINERALS) TABS Take 1 tablet by mouth every evening.   Yes Historical Provider, MD  nitroGLYCERIN (NITROSTAT) 0.4 MG SL tablet Place 0.4 mg under the tongue every 5 (five) minutes as needed for chest pain.   Yes Historical Provider, MD  omeprazole (PRILOSEC) 40 MG capsule Take 40 mg by mouth daily. 06/29/14 09/12/14 Yes Historical Provider, MD  ondansetron (ZOFRAN) 8 MG tablet Take 8 mg by mouth every 8 (eight) hours as needed for nausea or vomiting.   Yes Historical Provider, MD  OXYGEN-HELIUM IN Inhale 2 % into the lungs at bedtime. daily as needed. Frequency:PHARMDIR   Dosage:0.0     Instructions:  Note:Diagnosis: COPD HT:5'9 WT: 89KG Home O2 @ 2LPM via [place], as needed to keep O2 >92%. Concentrator, supplies, & Portable tank for transport. Sats on RA @ rest 06/09/12  Yes Historical Provider, MD  potassium chloride SA (K-DUR,KLOR-CON) 20 MEQ tablet Take 40 mEq by mouth 2 (two) times daily.    Yes Historical Provider, MD  predniSONE (DELTASONE) 5 MG tablet Take 5 mg by mouth daily. 10/28/13  Yes Historical Provider, MD  tiotropium (SPIRIVA) 18 MCG inhalation capsule Place 18 mcg into inhaler and inhale daily.   Yes Historical Provider, MD   Physical Exam: Filed Vitals:   09/12/14 0230  BP:   Pulse: 131  Temp:   Resp: 32    BP 100/50 mmHg  Pulse 131  Temp(Src) 97.8 F (36.6 C) (Oral)  Resp 32  SpO2 93%  General Appearance:    Alert, oriented, no distress, appears stated age  Head:    Normocephalic, atraumatic  Eyes:    PERRL, EOMI, sclera non-icteric        Nose:   Nares without drainage or epistaxis. Mucosa, turbinates normal  Throat:   Moist mucous membranes. Oropharynx without erythema or exudate.  Neck:   Supple. No carotid bruits.  No thyromegaly.  No lymphadenopathy.   Back:     No CVA tenderness, no spinal tenderness  Lungs:     Patient is tachypnic, unable to complete in full sentences, but insists that this is baseline for him and he is actually not SOB on oxygen.  Chest wall:    No tenderness to palpitation  Heart:    Regular rate and rhythm without murmurs, gallops, rubs  Abdomen:     Soft, non-tender, nondistended, normal bowel sounds, no organomegaly  Genitalia:    deferred  Rectal:    deferred  Extremities:   No clubbing, cyanosis or edema.  Pulses:   2+ and symmetric all extremities  Skin:   Skin color, texture, turgor normal, no rashes or lesions  Lymph nodes:   Cervical, supraclavicular, and axillary nodes normal  Neurologic:   CNII-XII intact. Normal strength, sensation and reflexes      throughout    Labs on Admission:  Basic Metabolic Panel:  Recent Labs Lab 09/11/14 2324  NA 137  K 3.9  CL 100*  CO2 26  GLUCOSE 113*  BUN 11  CREATININE 1.04  CALCIUM 9.1   Liver Function Tests:  Recent Labs Lab 09/11/14 2324  AST 23  ALT 19  ALKPHOS 68  BILITOT 0.8  PROT 5.9*  ALBUMIN 3.4*   No results for input(s): LIPASE, AMYLASE in  the last 168 hours. No results for input(s): AMMONIA in the last 168 hours. CBC:  Recent Labs Lab 09/11/14 2324  WBC 7.3  NEUTROABS 4.7  HGB 10.4*  HCT 33.4*  MCV 73.4*  PLT 164   Cardiac Enzymes: No results for input(s): CKTOTAL, CKMB, CKMBINDEX, TROPONINI in the last 168 hours.  BNP (last 3 results)  Recent Labs  12/31/13 0851  PROBNP 970.5*   CBG: No results for input(s): GLUCAP in the last 168 hours.  Radiological Exams on Admission: Dg Chest 2 View  09/12/2014   CLINICAL DATA:  Shortness of breath.  EXAM: CHEST  2 VIEW  COMPARISON:  Chest CT 06/27/2014  FINDINGS: There is elevation of right hemidiaphragm with mild adjacent atelectasis. Patient is post median sternotomy. The heart is at the upper limits of normal in size. Mild vascular congestion without overt edema no large pleural effusion or pneumothorax. No confluent airspace disease. Surgical clips in the right neck. Left proximal humeral prosthesis, alignment unchanged from prior CT.  IMPRESSION: 1. Mild vascular congestion. 2. Unchanged elevation of right hemidiaphragm with adjacent atelectasis.   Electronically Signed   By: Jeb Levering M.D.   On: 09/12/2014 00:56   Ct Angio Chest Pe W/cm &/or Wo Cm  09/12/2014   CLINICAL DATA:  Increased shortness of breath for 5 days.  EXAM: CT ANGIOGRAPHY CHEST WITH CONTRAST  TECHNIQUE: Multidetector CT imaging of the chest was performed using the standard protocol during bolus administration of intravenous contrast. Multiplanar CT image reconstructions and MIPs were obtained to evaluate the vascular anatomy.  CONTRAST:  70mL OMNIPAQUE IOHEXOL 350 MG/ML SOLN  COMPARISON:  Chest CT 06/27/2014  FINDINGS: There are no filling defects within the pulmonary arteries to suggest pulmonary embolus.  Dense atherosclerosis of thoracic aorta without aneurysm. Patient is post CABG. Dense calcification of the native coronary arteries. There is left atrial enlargement that appears similar to prior  exam. No mediastinal or hilar adenopathy. No pleural or pericardial effusion. There is elevation of right hemidiaphragm.  Emphysema again seen. There are diffuse ground-glass opacities, left greater than right with smooth septal thickening. Findings suggest pulmonary edema. Atelectasis or scarring at the right lung base adjacent elevated right hemidiaphragm. Atelectasis in the right middle lobe as well.  Low-density lesion in the left hepatic lobe consistent with cyst, unchanged. Unchanged 1.3 cm right adrenal nodule. Dense atherosclerosis of the upper abdominal vasculature. Nonobstructing right renal stone.  Left shoulder arthroplasty unchanged in alignment. The bones are under mineralized. There are no lytic or destructive osseous lesions pneumothorax. Multiple surgical clips in the right neck.  Review of the MIP images confirms the above findings.  IMPRESSION: 1. No pulmonary embolus. 2. Findings suspicious for pulmonary edema. Atypical infection could have a similar appearance but is felt less likely given the smooth septal thickening. 3. Unchanged right adrenal nodule. Unchanged elevation of right hemidiaphragm.   Electronically Signed   By: Jeb Levering M.D.   On: 09/12/2014 02:12    EKG: Independently reviewed.  Assessment/Plan Principal Problem:   Acute on chronic respiratory failure with hypoxia Active Problems:   Hypertension   Hyperlipidemia   Fluid overload   1. Acute on chronic respiratory failure with hypoxia - 1. Satting well on O2 2. Is tachypnic and unable to complete sentences but he insists this is his baseline and he doesn't feel more short of breath now that he is on oxygen. 2. Fluid overload - 1. ? Mild edema on CXR 2. Given lasix in ED 3. Will put on 20mg  lasix daily 4. Heart failure pathway 3. HTN - continue home meds 4. HLD - continue home meds    Code Status: Full Code  Family Communication: No family in room Disposition Plan: Admit to obs   Time spent: 50  min  GARDNER, JARED M. [facility] Pager [phone]  If 7AM-7PM, please contact the day team taking care of the patient [email or web address] Password [email or web address] 09/12/2014, 2:56 AM

## 2014-09-12 NOTE — Discharge Instructions (Signed)
Follow with Simona Huh, MD in 1-2 weeks Follow with Dr. Wynonia Lawman in 2 weeks  Please get a complete blood count and chemistry panel checked by your Primary MD at your next visit, and again as instructed by your Primary MD. Please get your medications reviewed and adjusted by your Primary MD.  Please request your Primary MD to go over all Hospital Tests and Procedure/Radiological results at the follow up, please get all Hospital records sent to your Prim MD by signing hospital release before you go home.  If you had Pneumonia of Lung problems at the Hospital: Please get a 2 view Chest X ray done in 6-8 weeks after hospital discharge or sooner if instructed by your Primary MD.  If you have Congestive Heart Failure: Please call your Cardiologist or Primary MD anytime you have any of the following symptoms:  1) 3 pound weight gain in 24 hours or 5 pounds in 1 week  2) shortness of breath, with or without a dry hacking cough  3) swelling in the hands, feet or stomach  4) if you have to sleep on extra pillows at night in order to breathe  Follow cardiac low salt diet and 1.5 lit/day fluid restriction.  If you have diabetes Accuchecks 4 times/day, Once in AM empty stomach and then before each meal. Log in all results and show them to your primary doctor at your next visit. If any glucose reading is under 80 or above 300 call your primary MD immediately.  If you have Seizure/Convulsions/Epilepsy: Please do not drive, operate heavy machinery, participate in activities at heights or participate in high speed sports until you have seen by Primary MD or a Neurologist and advised to do so again.  If you had Gastrointestinal Bleeding: Please ask your Primary MD to check a complete blood count within one week of discharge or at your next visit. Your endoscopic/colonoscopic biopsies that are pending at the time of discharge, will also need to followed by your Primary MD.  Get Medicines reviewed and  adjusted. Please take all your medications with you for your next visit with your Primary MD  Please request your Primary MD to go over all hospital tests and procedure/radiological results at the follow up, please ask your Primary MD to get all Hospital records sent to his/her office.  If you experience worsening of your admission symptoms, develop shortness of breath, life threatening emergency, suicidal or homicidal thoughts you must seek medical attention immediately by calling 911 or calling your MD immediately  if symptoms less severe.  You must read complete instructions/literature along with all the possible adverse reactions/side effects for all the Medicines you take and that have been prescribed to you. Take any new Medicines after you have completely understood and accpet all the possible adverse reactions/side effects.   Do not drive or operate heavy machinery when taking Pain medications.   Do not take more than prescribed Pain, Sleep and Anxiety Medications  Special Instructions: If you have smoked or chewed Tobacco  in the last 2 yrs please stop smoking, stop any regular Alcohol  and or any Recreational drug use.  Wear Seat belts while driving.  Please note You were cared for by a hospitalist during your hospital stay. If you have any questions about your discharge medications or the care you received while you were in the hospital after you are discharged, you can call the unit and asked to speak with the hospitalist on call if the hospitalist that took  care of you is not available. Once you are discharged, your primary care physician will handle any further medical issues. Please note that NO REFILLS for any discharge medications will be authorized once you are discharged, as it is imperative that you return to your primary care physician (or establish a relationship with a primary care physician if you do not have one) for your aftercare needs so that they can reassess your need  for medications and monitor your lab values.  You can reach the hospitalist office at phone [phone] or fax [phone]   If you do not have a primary care physician, you can call [phone] for a physician referral.  Activity: As tolerated with Full fall precautions use walker/cane & assistance as needed  Diet: heart healthy  Disposition Home

## 2014-09-12 NOTE — ED Notes (Signed)
Admitting MD at BS.  

## 2014-09-12 NOTE — Progress Notes (Signed)
Patient seen and examined this morning, agree with H&P. Admitted with mild fluid overload  - feeling "1000% better" this morning - breathing is at baseline for him  Continue Lasix Will ask PT to evaluate Obtain 2D echo Continue telemetry and his home medications  Lissy Deuser M. Cruzita Lederer, MD [facility] [phone]

## 2014-09-12 NOTE — ED Notes (Signed)
Unable to give report.  RN reports they need to "dig up" a monitor before they can take the patient

## 2014-09-12 NOTE — ED Notes (Signed)
Unable to give report, room to be changed.

## 2014-09-12 NOTE — ED Notes (Signed)
Placed condom catheter on patient per nurse request. .

## 2014-09-12 NOTE — ED Notes (Signed)
Unable to give report.  RN will call back

## 2014-09-12 NOTE — Progress Notes (Signed)
  Echocardiogram 2D Echocardiogram has been performed.  Cleburne Savini FRANCES 09/12/2014, 3:59 PM

## 2014-09-12 NOTE — Evaluation (Signed)
Physical Therapy Evaluation Patient Details Name: Gabriel Wood MRN: [id number] DOB: 1924/02/05 Today's Date: 09/12/2014   History of Present Illness  Pt is an 81 y/o male with a PMH of COPD on 2L/min home O2 at baseline (pt states only at night), CAD, HTN, CHF. Pt was brought in from home with complaints of constant and ongoing SOB for the past 5 days which worsened earlier the day of admission.   Clinical Impression  Pt admitted with above diagnosis. Pt currently with functional limitations due to the deficits listed below (see PT Problem List). At the time of PT eval pt was able to perform transfers with min assist, and ambulation with min-mod assist. x3 losses of balance in which physical assist was required to recover while walking from bed to chair. Recommended that pt be changed to High Fall Risk. RN was notified that pt's L lower leg was bleeding at end of session, unsure of cause. RN was also notified that pt's HR varied from being bradycardic into the 30's and then tachycardic at rest into the 150's. O2 sats decreased into the low 80's with supplemental O2 donned.   Pt will benefit from skilled PT to increase their independence and safety with mobility to allow discharge to the venue listed below. Recommending HHPT at this time. Pt reports that he will have 24 hour supervision at d/c.      Follow Up Recommendations Home health PT;Supervision/Assistance - 24 hour    Equipment Recommendations  None recommended by PT    Recommendations for Other Services       Precautions / Restrictions Precautions Precautions: Fall Precaution Comments: Pt listed as low fall risk, however during eval demonstrated to be a high fall risk.  Restrictions Weight Bearing Restrictions: No      Mobility  Bed Mobility Overal bed mobility: Needs Assistance Bed Mobility: Supine to Sit     Supine to sit: Supervision     General bed mobility comments: Pt was able to transition to EOB with no  physical assistance. Supervision for safety and increased time to achieve full sitting.   Transfers Overall transfer level: Needs assistance Equipment used: 1 person hand held assist Transfers: Sit to/from Stand Sit to Stand: Min assist         General transfer comment: Assist for balance support. Pt was able to power-up to full standing position with increased time to gain and maintain standing balance.   Ambulation/Gait Ambulation/Gait assistance: Min assist;Mod assist Ambulation Distance (Feet): 15 Feet Assistive device: 1 person hand held assist Gait Pattern/deviations: Step-through pattern;Decreased stride length;Trunk flexed Gait velocity: Decreased Gait velocity interpretation: Below normal speed for age/gender General Gait Details: Pt walked around the bed to the chair. Had x3 losses of balance and required min to mod assist with gait belt to recover.   Stairs            Wheelchair Mobility    Modified Rankin (Stroke Patients Only)       Balance Overall balance assessment: Needs assistance Sitting-balance support: Feet supported;No upper extremity supported Sitting balance-Leahy Scale: Fair     Standing balance support: No upper extremity supported Standing balance-Leahy Scale: Poor Standing balance comment: Pt requires UE support at this time for dynamic standing activity.                              Pertinent Vitals/Pain Pain Assessment: No/denies pain    Home Living Family/patient expects to  be discharged to:: Private residence Living Arrangements: Children Available Help at Discharge: Family;Available 24 hours/day Type of Home: House Home Access: Stairs to enter Entrance Stairs-Rails: None Entrance Stairs-Number of Steps: 1 Home Layout: One level Home Equipment: Cane - single point;Walker - 2 wheels;Shower seat - built in;Grab bars - tub/shower;Bedside commode;Wheelchair - manual      Prior Function Level of Independence:  Independent with assistive device(s)         Comments: RW vs. SPC at home. States he uses something almost all the time.      Hand Dominance   Dominant Hand: Right    Extremity/Trunk Assessment   Upper Extremity Assessment: Defer to OT evaluation           Lower Extremity Assessment: Generalized weakness      Cervical / Trunk Assessment: Kyphotic  Communication   Communication: HOH  Cognition Arousal/Alertness: Awake/alert Behavior During Therapy: WFL for tasks assessed/performed Overall Cognitive Status: Within Functional Limits for tasks assessed                      General Comments      Exercises        Assessment/Plan    PT Assessment Patient needs continued PT services  PT Diagnosis Difficulty walking;Generalized weakness   PT Problem List Decreased strength;Decreased range of motion;Decreased activity tolerance;Decreased balance;Decreased mobility;Decreased knowledge of use of DME;Decreased safety awareness;Decreased knowledge of precautions;Cardiopulmonary status limiting activity;Decreased skin integrity  PT Treatment Interventions DME instruction;Gait training;Stair training;Functional mobility training;Therapeutic activities;Therapeutic exercise;Neuromuscular re-education;Patient/family education   PT Goals (Current goals can be found in the Care Plan section) Acute Rehab PT Goals Patient Stated Goal: Return to his home PT Goal Formulation: With patient Time For Goal Achievement: 09/26/14 Potential to Achieve Goals: Good    Frequency Min 3X/week   Barriers to discharge        Co-evaluation               End of Session Equipment Utilized During Treatment: Gait belt;Oxygen Activity Tolerance: Treatment limited secondary to medical complications (Comment) Loletha Grayer followed by Wilburt Finlay; O2 sats in low 80's) Patient left: in chair;with call bell/phone within reach;with chair alarm set Nurse Communication: Mobility status;Other  (comment) (HR status, O2 status)         Time: 1340-1405 PT Time Calculation (min) (ACUTE ONLY): 25 min   Charges:   PT Evaluation $Initial PT Evaluation Tier I: 1 Procedure PT Treatments $Therapeutic Activity: 8-22 mins   PT G Codes:        Rolinda Roan October 02, 2014, 3:26 PM   Rolinda Roan, PT, DPT Acute Rehabilitation Services Pager: [phone]

## 2014-09-13 NOTE — Discharge Summary (Signed)
Physician Discharge Summary  Gabriel Wood:[id number] DOB: 06/19/1923 DOA: 09/11/2014  PCP: Simona Huh, MD  Admit date: 09/11/2014 Discharge date: 09/13/2014  Time spent: > 30 minutes  Recommendations for Outpatient Follow-up:  1. Follow up with Dr. Wynonia Lawman in 1-2 weeks 2. Follow up with PCP in 1-2 months   Discharge Diagnoses:  Principal Problem:   Acute on chronic respiratory failure with hypoxia Active Problems:   Hypertension   Hyperlipidemia   Fluid overload  Discharge Condition: stable  Diet recommendation: heart healthy  Filed Weights   09/12/14 0610  Weight: 71.5 kg (157 lb 10.1 oz)   History of present illness:  Gabriel Wood is a 81 y.o. male h/o COPD on 2L home O2 at baseline, CAD, HTN, CHF, brought in from his NH with c/o constant and ongoing SOB for the past 5 days, worsened earlier this morning. Patient typically wears 2 L of home O2 as needed at home. Takes lasix "PRN" but hasnt taken any in a while. He is now requiring 4L to maintain sats.  Hospital Course:  Patient admitted to the hospital with mild fluid overload. He has Lasix prescribed as needed for home however has not been taking it. He usually monitors his weight regularly however has not been doing to for the past week. He was given Lasix IV x 2 doses in the hospital and has been net negative 2.19 L. His respiratory status has improved and is now back to baseline ans is asking to go home. He was able to work well with PT and HHPT was recommended. He had no chest pain / dyspnea with ambulation. He underwent a 2D echo which showed EF 50 - 55%. Patient is to follow up with Dr. Wynonia Lawman in ~2 weeks as an outpatient. His other medical problems have remained stable and no changes were made to his home regimen.   Procedures:  2D echo Study Conclusions - Left ventricle: The cavity size was normal. There was moderateconcentric hypertrophy. Systolic function was normal. Theestimated ejection  fraction was in the range of 50% to 55%. Wallmotion was normal; there were no regional wall motionabnormalities. - Aortic valve: Valve mobility was moderately restricted. There wasmoderate stenosis. Valve area (VTI): 1 cm^2. Valve area (Vmax):1.05 cm^2. Valve area (Vmean): 0.98 cm^2. - Mitral valve: There was moderate regurgitation. - Left atrium: The atrium was severely dilated. - Right ventricle: The cavity size was mildly dilated. Wallthickness was normal. - Right atrium: The atrium was moderately dilated. - Pulmonary arteries: Systolic pressure was mildly increased. PApeak pressure: 38 mm Hg (S).   Consultations:  None   Discharge Exam: Filed Vitals:   09/12/14 0610 09/12/14 0926 09/12/14 1232 09/12/14 1412  BP: 128/58   98/61  Pulse: 116   95  Temp: 98.3 F (36.8 C)   97.4 F (36.3 C)  TempSrc: Oral   Oral  Resp: 24   20  Height:   5\' 8"  (1.727 m)   Weight: 71.5 kg (157 lb 10.1 oz)     SpO2: 95% 95%  100%   General: NAD Cardiovascular: RRR Respiratory: CTA biL   Discharge Instructions    Medication List    TAKE these medications        acetaminophen 650 MG CR tablet  Commonly known as:  TYLENOL  Take 1,300 mg by mouth every 8 (eight) hours as needed for pain.     albuterol 108 (90 BASE) MCG/ACT inhaler  Commonly known as:  PROVENTIL HFA;VENTOLIN HFA  Inhale  2 puffs into the lungs every 6 (six) hours as needed for wheezing or shortness of breath.     aspirin EC 81 MG tablet  Take 81 mg by mouth daily.     atorvastatin 40 MG tablet  Commonly known as:  LIPITOR  Take 40 mg by mouth daily.     budesonide-formoterol 160-4.5 MCG/ACT inhaler  Commonly known as:  SYMBICORT  Inhale 2 puffs into the lungs 2 (two) times daily.     citalopram 10 MG tablet  Commonly known as:  CELEXA  Take 10 mg by mouth daily.     famotidine 20 MG tablet  Commonly known as:  PEPCID  Take 20 mg by mouth daily.     fluticasone 50 MCG/ACT nasal spray  Commonly known as:   FLONASE  Place 1 spray into both nostrils daily.     furosemide 20 MG tablet  Commonly known as:  LASIX  Take 20 mg by mouth as needed.     magnesium oxide 400 (241.3 MG) MG tablet  Commonly known as:  MAG-OX  Take 400 mg by mouth daily.     meclizine 25 MG tablet  Commonly known as:  ANTIVERT  Take 25 mg by mouth daily.     metoprolol succinate 25 MG 24 hr tablet  Commonly known as:  TOPROL-XL  Take 12.5 mg by mouth daily.     multivitamin with minerals Tabs tablet  Take 1 tablet by mouth every evening.     nitroGLYCERIN 0.4 MG SL tablet  Commonly known as:  NITROSTAT  Place 0.4 mg under the tongue every 5 (five) minutes as needed for chest pain.     omeprazole 40 MG capsule  Commonly known as:  PRILOSEC  Take 40 mg by mouth daily.     ondansetron 8 MG tablet  Commonly known as:  ZOFRAN  Take 8 mg by mouth every 8 (eight) hours as needed for nausea or vomiting.     OXYGEN  Inhale 2 % into the lungs at bedtime. daily as needed. Frequency:PHARMDIR   Dosage:0.0     Instructions:  Note:Diagnosis: COPD HT:5'9 WT: 89KG Home O2 @ 2LPM via [place], as needed to keep O2 >92%. Concentrator, supplies, & Portable tank for transport. Sats on RA @ rest     potassium chloride SA 20 MEQ tablet  Commonly known as:  K-DUR,KLOR-CON  Take 40 mEq by mouth 2 (two) times daily.     predniSONE 5 MG tablet  Commonly known as:  DELTASONE  Take 5 mg by mouth daily.     tiotropium 18 MCG inhalation capsule  Commonly known as:  SPIRIVA  Place 18 mcg into inhaler and inhale daily.           Follow-up Information    Follow up with TILLEY JR,W SPENCER, MD. Schedule an appointment as soon as possible for a visit in 2 weeks.   Specialty:  Cardiology   Contact information:   [address] [place] [address] [phone]       The results of significant diagnostics from this hospitalization (including imaging, microbiology, ancillary and laboratory) are listed below for  reference.    Significant Diagnostic Studies: Dg Chest 2 View  09/12/2014   CLINICAL DATA:  Shortness of breath.  EXAM: CHEST  2 VIEW  COMPARISON:  Chest CT 06/27/2014  FINDINGS: There is elevation of right hemidiaphragm with mild adjacent atelectasis. Patient is post median sternotomy. The heart is at the upper limits  of normal in size. Mild vascular congestion without overt edema no large pleural effusion or pneumothorax. No confluent airspace disease. Surgical clips in the right neck. Left proximal humeral prosthesis, alignment unchanged from prior CT.  IMPRESSION: 1. Mild vascular congestion. 2. Unchanged elevation of right hemidiaphragm with adjacent atelectasis.   Electronically Signed   By: Jeb Levering M.D.   On: 09/12/2014 00:56   Ct Angio Chest Pe W/cm &/or Wo Cm  09/12/2014   CLINICAL DATA:  Increased shortness of breath for 5 days.  EXAM: CT ANGIOGRAPHY CHEST WITH CONTRAST  TECHNIQUE: Multidetector CT imaging of the chest was performed using the standard protocol during bolus administration of intravenous contrast. Multiplanar CT image reconstructions and MIPs were obtained to evaluate the vascular anatomy.  CONTRAST:  12mL OMNIPAQUE IOHEXOL 350 MG/ML SOLN  COMPARISON:  Chest CT 06/27/2014  FINDINGS: There are no filling defects within the pulmonary arteries to suggest pulmonary embolus.  Dense atherosclerosis of thoracic aorta without aneurysm. Patient is post CABG. Dense calcification of the native coronary arteries. There is left atrial enlargement that appears similar to prior exam. No mediastinal or hilar adenopathy. No pleural or pericardial effusion. There is elevation of right hemidiaphragm.  Emphysema again seen. There are diffuse ground-glass opacities, left greater than right with smooth septal thickening. Findings suggest pulmonary edema. Atelectasis or scarring at the right lung base adjacent elevated right hemidiaphragm. Atelectasis in the right middle lobe as well.  Low-density  lesion in the left hepatic lobe consistent with cyst, unchanged. Unchanged 1.3 cm right adrenal nodule. Dense atherosclerosis of the upper abdominal vasculature. Nonobstructing right renal stone.  Left shoulder arthroplasty unchanged in alignment. The bones are under mineralized. There are no lytic or destructive osseous lesions pneumothorax. Multiple surgical clips in the right neck.  Review of the MIP images confirms the above findings.  IMPRESSION: 1. No pulmonary embolus. 2. Findings suspicious for pulmonary edema. Atypical infection could have a similar appearance but is felt less likely given the smooth septal thickening. 3. Unchanged right adrenal nodule. Unchanged elevation of right hemidiaphragm.   Electronically Signed   By: Jeb Levering M.D.   On: 09/12/2014 02:12   Labs: Basic Metabolic Panel:  Recent Labs Lab 09/11/14 2324  NA 137  K 3.9  CL 100*  CO2 26  GLUCOSE 113*  BUN 11  CREATININE 1.04  CALCIUM 9.1   Liver Function Tests:  Recent Labs Lab 09/11/14 2324  AST 23  ALT 19  ALKPHOS 68  BILITOT 0.8  PROT 5.9*  ALBUMIN 3.4*   CBC:  Recent Labs Lab 09/11/14 2324  WBC 7.3  NEUTROABS 4.7  HGB 10.4*  HCT 33.4*  MCV 73.4*  PLT 164   Cardiac Enzymes:  Recent Labs Lab 09/12/14 0215 09/12/14 0930 09/12/14 1507  TROPONINI <0.03 <0.03 <0.03   BNP: BNP (last 3 results)  Recent Labs  09/11/14 2329  BNP 138.9*    ProBNP (last 3 results)  Recent Labs  12/31/13 0851  PROBNP 970.5*    Signed:  Marzetta Board  [facility] 09/13/2014, 6:26 PM

## 2014-09-13 NOTE — Care Management Note (Signed)
Case Management Note  Patient Details  Name: CHASYN CINQUE MRN: [id number] Date of Birth: [birth year]  Subjective/Objective:    Pt admitted with HF                Action/Plan: PTA pt lived at home- anticipate return home with no needs pt has returned to baseline  Expected Discharge Date:                  Expected Discharge Plan:  Home/Self Care  In-House Referral:     Discharge planning Services  CM Consult  Post Acute Care Choice:    Choice offered to:     DME Arranged:    DME Agency:     HH Arranged:    [place] Agency:     Status of Service:  Completed, signed off  Medicare Important Message Given:  N/A - LOS <3 / Initial given by admissions Date Medicare IM Given:    Medicare IM give by:    Date Additional Medicare IM Given:    Additional Medicare Important Message give by:     If discussed at [place] of Stay Meetings, dates discussed:    Additional Comments:  Dawayne Patricia, RN 09/13/2014, 9:46 AM

## 2014-09-13 NOTE — Care Management Note (Addendum)
Case Management Note  Patient Details  Name: [name] MRN: [id number] Date of Birth: 09-19-1923  Subjective/Objective:    Pt admitted with HF                Action/Plan: PTA pt lived at home- anticipate return home with no needs pt has returned to baseline  Expected Discharge Date:                  Expected Discharge Plan:  Home/Self Care  In-House Referral:     Discharge planning Services  CM Consult  Post Acute Care Choice:  Home Health Choice offered to:  Adult Children  DME Arranged:    DME Agency:     HH Arranged:  Patient Refused [place] Agency:     Status of Service:  Completed, signed off  Medicare Important Message Given:  N/A - LOS <3 / Initial given by admissions Date Medicare IM Given:    Medicare IM give by:    Date Additional Medicare IM Given:    Additional Medicare Important Message give by:     If discussed at [place] of Stay Meetings, dates discussed:    Additional Comments:  09/13/14- 1000 - post discharge call made to pt- spoke with pt's son- Dominica Severin- regarding HH order for PT - pt discharged last evening after hours without CM f/u on PT recommendations and HH order- per conversation with son pt states that he has a stationary bike that he uses and that pt does not desire any HH f/u at this time- explained PT recommendations and pt still politely declined [place] services at this time per son. No HH referral made.   Dawayne Patricia, RN 09/13/2014, 10:00 AM

## 2014-09-14 NOTE — Progress Notes (Signed)
Physical Therapy Addendum for G-Codes    09-26-14 1415  PT G-Codes **NOT FOR INPATIENT CLASS**  Functional Assessment Tool Used Clinical judgement  Functional Limitation Mobility: Walking and moving around  Mobility: Walking and Moving Around Current Status [phone]) CI  Mobility: Walking and Moving Around Goal Status (H2197) CI    Rolinda Roan, PT, DPT Acute Rehabilitation Services Pager: [phone]

## 2014-09-19 ENCOUNTER — Ambulatory Visit
Admission: RE | Admit: 2014-09-19 | Discharge: 2014-09-19 | Disposition: A | Discharge status: Home / Self Care | Payer: Medicare Other | Source: Ambulatory Visit | Attending: Family Medicine | Admitting: Family Medicine

## 2014-09-19 ENCOUNTER — Other Ambulatory Visit: Payer: Self-pay | Admitting: Family Medicine

## 2014-09-19 DIAGNOSIS — R4702 Dysphasia: Secondary | ICD-10-CM

## 2014-09-22 ENCOUNTER — Other Ambulatory Visit: Payer: Self-pay | Admitting: Radiation Therapy

## 2014-09-22 DIAGNOSIS — C7931 Secondary malignant neoplasm of brain: Secondary | ICD-10-CM

## 2014-09-23 ENCOUNTER — Other Ambulatory Visit (HOSPITAL_COMMUNITY): Payer: Self-pay | Admitting: Specialist

## 2014-09-23 DIAGNOSIS — C439 Malignant melanoma of skin, unspecified: Secondary | ICD-10-CM

## 2014-09-24 ENCOUNTER — Inpatient Hospital Stay (HOSPITAL_COMMUNITY)
Admission: EM | Admit: 2014-09-24 | Discharge: 2014-09-26 | DRG: 189 | Disposition: A | Discharge status: Home / Self Care | Payer: Medicare Other | Attending: Internal Medicine | Admitting: Internal Medicine

## 2014-09-24 ENCOUNTER — Encounter (HOSPITAL_COMMUNITY): Payer: Self-pay | Admitting: Emergency Medicine

## 2014-09-24 ENCOUNTER — Emergency Department (HOSPITAL_COMMUNITY): Payer: Medicare Other

## 2014-09-24 DIAGNOSIS — C7931 Secondary malignant neoplasm of brain: Secondary | ICD-10-CM | POA: Diagnosis present

## 2014-09-24 DIAGNOSIS — Z951 Presence of aortocoronary bypass graft: Secondary | ICD-10-CM

## 2014-09-24 DIAGNOSIS — Z86718 Personal history of other venous thrombosis and embolism: Secondary | ICD-10-CM

## 2014-09-24 DIAGNOSIS — I4891 Unspecified atrial fibrillation: Secondary | ICD-10-CM | POA: Diagnosis present

## 2014-09-24 DIAGNOSIS — K589 Irritable bowel syndrome without diarrhea: Secondary | ICD-10-CM | POA: Diagnosis present

## 2014-09-24 DIAGNOSIS — Z7982 Long term (current) use of aspirin: Secondary | ICD-10-CM

## 2014-09-24 DIAGNOSIS — Z9981 Dependence on supplemental oxygen: Secondary | ICD-10-CM

## 2014-09-24 DIAGNOSIS — I509 Heart failure, unspecified: Secondary | ICD-10-CM | POA: Diagnosis present

## 2014-09-24 DIAGNOSIS — Z87891 Personal history of nicotine dependence: Secondary | ICD-10-CM

## 2014-09-24 DIAGNOSIS — Z8673 Personal history of transient ischemic attack (TIA), and cerebral infarction without residual deficits: Secondary | ICD-10-CM

## 2014-09-24 DIAGNOSIS — I10 Essential (primary) hypertension: Secondary | ICD-10-CM | POA: Diagnosis not present

## 2014-09-24 DIAGNOSIS — I714 Abdominal aortic aneurysm, without rupture: Secondary | ICD-10-CM | POA: Diagnosis present

## 2014-09-24 DIAGNOSIS — R0602 Shortness of breath: Secondary | ICD-10-CM | POA: Diagnosis not present

## 2014-09-24 DIAGNOSIS — E785 Hyperlipidemia, unspecified: Secondary | ICD-10-CM | POA: Diagnosis not present

## 2014-09-24 DIAGNOSIS — Z923 Personal history of irradiation: Secondary | ICD-10-CM

## 2014-09-24 DIAGNOSIS — D649 Anemia, unspecified: Secondary | ICD-10-CM | POA: Diagnosis present

## 2014-09-24 DIAGNOSIS — F419 Anxiety disorder, unspecified: Secondary | ICD-10-CM | POA: Diagnosis present

## 2014-09-24 DIAGNOSIS — I2510 Atherosclerotic heart disease of native coronary artery without angina pectoris: Secondary | ICD-10-CM | POA: Diagnosis present

## 2014-09-24 DIAGNOSIS — Z79899 Other long term (current) drug therapy: Secondary | ICD-10-CM

## 2014-09-24 DIAGNOSIS — J441 Chronic obstructive pulmonary disease with (acute) exacerbation: Secondary | ICD-10-CM | POA: Diagnosis present

## 2014-09-24 DIAGNOSIS — I272 Other secondary pulmonary hypertension: Secondary | ICD-10-CM | POA: Diagnosis present

## 2014-09-24 DIAGNOSIS — N400 Enlarged prostate without lower urinary tract symptoms: Secondary | ICD-10-CM | POA: Diagnosis present

## 2014-09-24 DIAGNOSIS — C7800 Secondary malignant neoplasm of unspecified lung: Secondary | ICD-10-CM | POA: Diagnosis present

## 2014-09-24 DIAGNOSIS — Z85820 Personal history of malignant melanoma of skin: Secondary | ICD-10-CM

## 2014-09-24 DIAGNOSIS — Z7901 Long term (current) use of anticoagulants: Secondary | ICD-10-CM

## 2014-09-24 DIAGNOSIS — J9621 Acute and chronic respiratory failure with hypoxia: Principal | ICD-10-CM | POA: Diagnosis present

## 2014-09-24 DIAGNOSIS — K219 Gastro-esophageal reflux disease without esophagitis: Secondary | ICD-10-CM | POA: Diagnosis present

## 2014-09-24 DIAGNOSIS — C799 Secondary malignant neoplasm of unspecified site: Secondary | ICD-10-CM | POA: Diagnosis present

## 2014-09-24 DIAGNOSIS — M1990 Unspecified osteoarthritis, unspecified site: Secondary | ICD-10-CM | POA: Diagnosis present

## 2014-09-24 DIAGNOSIS — C7951 Secondary malignant neoplasm of bone: Secondary | ICD-10-CM | POA: Diagnosis present

## 2014-09-24 DIAGNOSIS — C439 Malignant melanoma of skin, unspecified: Secondary | ICD-10-CM | POA: Diagnosis present

## 2014-09-24 DIAGNOSIS — I080 Rheumatic disorders of both mitral and aortic valves: Secondary | ICD-10-CM | POA: Diagnosis present

## 2014-09-24 LAB — I-STAT CHEM 8, ED
BUN: 22 mg/dL — ABNORMAL HIGH (ref 6–20)
Calcium, Ion: 1.3 mmol/L (ref 1.13–1.30)
Chloride: 102 mmol/L (ref 101–111)
Creatinine, Ser: 1.1 mg/dL (ref 0.61–1.24)
Glucose, Bld: 116 mg/dL — ABNORMAL HIGH (ref 65–99)
HCT: 35 % — ABNORMAL LOW (ref 39.0–52.0)
Hemoglobin: 11.9 g/dL — ABNORMAL LOW (ref 13.0–17.0)
Potassium: 4.6 mmol/L (ref 3.5–5.1)
Sodium: 136 mmol/L (ref 135–145)
TCO2: 25 mmol/L (ref 0–100)

## 2014-09-24 LAB — CBC
HCT: 35 % — ABNORMAL LOW (ref 39.0–52.0)
HEMOGLOBIN: 10.7 g/dL — AB (ref 13.0–17.0)
MCH: 22.3 pg — ABNORMAL LOW (ref 26.0–34.0)
MCHC: 30.6 g/dL (ref 30.0–36.0)
MCV: 72.9 fL — ABNORMAL LOW (ref 78.0–100.0)
PLATELETS: 252 10*3/uL (ref 150–400)
RBC: 4.8 MIL/uL (ref 4.22–5.81)
RDW: 19.9 % — ABNORMAL HIGH (ref 11.5–15.5)
WBC: 9.5 10*3/uL (ref 4.0–10.5)

## 2014-09-24 LAB — BASIC METABOLIC PANEL
Anion gap: 8 (ref 5–15)
BUN: 20 mg/dL (ref 6–20)
CO2: 25 mmol/L (ref 22–32)
Calcium: 9.7 mg/dL (ref 8.9–10.3)
Chloride: 104 mmol/L (ref 101–111)
Creatinine, Ser: 1.22 mg/dL (ref 0.61–1.24)
GFR calc Af Amer: 59 mL/min — ABNORMAL LOW (ref >60)
GFR, EST NON AFRICAN AMERICAN: 51 mL/min — AB (ref >60)
Glucose, Bld: 111 mg/dL — ABNORMAL HIGH (ref 65–99)
Potassium: 5.6 mmol/L — ABNORMAL HIGH (ref 3.5–5.1)
Sodium: 137 mmol/L (ref 135–145)

## 2014-09-24 LAB — I-STAT TROPONIN, ED: Troponin i, poc: 0 ng/mL (ref 0.00–0.08)

## 2014-09-24 LAB — BRAIN NATRIURETIC PEPTIDE: B Natriuretic Peptide: 174.7 pg/mL — ABNORMAL HIGH (ref 0.0–100.0)

## 2014-09-24 MED ORDER — SODIUM CHLORIDE 0.9 % IJ SOLN: 3.0000 mL | INTRAMUSCULAR | Status: DC | PRN

## 2014-09-24 MED ORDER — METHYLPREDNISOLONE SODIUM SUCC 125 MG IJ SOLR
125.0000 mg | Freq: Once | INTRAMUSCULAR | Status: AC
  Administered 2014-09-24: 125 mg via INTRAVENOUS
  Filled 2014-09-24: qty 2

## 2014-09-24 MED ORDER — SODIUM CHLORIDE 0.9 % IJ SOLN
3.0000 mL | Freq: Two times a day (BID) | INTRAMUSCULAR | Status: DC
  Administered 2014-09-25 – 2014-09-26 (×4): 3 mL via INTRAVENOUS

## 2014-09-24 MED ORDER — METOPROLOL SUCCINATE 12.5 MG HALF TABLET
12.5000 mg | ORAL_TABLET | Freq: Every day | ORAL | Status: DC
  Administered 2014-09-25: 12.5 mg via ORAL
  Filled 2014-09-24 (×2): qty 1

## 2014-09-24 MED ORDER — MECLIZINE HCL 25 MG PO TABS
25.0000 mg | ORAL_TABLET | Freq: Every day | ORAL | Status: DC
  Administered 2014-09-25 – 2014-09-26 (×2): 25 mg via ORAL
  Filled 2014-09-24 (×2): qty 1

## 2014-09-24 MED ORDER — PREDNISONE 5 MG PO TABS
5.0000 mg | ORAL_TABLET | Freq: Every day | ORAL | Status: DC
Start: 2014-09-25 — End: 2014-09-24

## 2014-09-24 MED ORDER — TIOTROPIUM BROMIDE MONOHYDRATE 18 MCG IN CAPS
18.0000 ug | ORAL_CAPSULE | Freq: Every day | RESPIRATORY_TRACT | Status: DC
  Administered 2014-09-25 – 2014-09-26 (×2): 18 ug via RESPIRATORY_TRACT
  Filled 2014-09-24: qty 5

## 2014-09-24 MED ORDER — ACETAMINOPHEN 325 MG PO TABS: 650.0000 mg | ORAL_TABLET | ORAL | Status: DC | PRN

## 2014-09-24 MED ORDER — ONDANSETRON HCL 4 MG/2ML IJ SOLN: 4.0000 mg | Freq: Four times a day (QID) | INTRAMUSCULAR | Status: DC | PRN

## 2014-09-24 MED ORDER — IPRATROPIUM-ALBUTEROL 0.5-2.5 (3) MG/3ML IN SOLN
3.0000 mL | Freq: Once | RESPIRATORY_TRACT | Status: AC
  Administered 2014-09-24: 3 mL via RESPIRATORY_TRACT
  Filled 2014-09-24: qty 3

## 2014-09-24 MED ORDER — FLUTICASONE PROPIONATE 50 MCG/ACT NA SUSP
1.0000 | Freq: Every day | NASAL | Status: DC
  Administered 2014-09-25 – 2014-09-26 (×2): 1 via NASAL
  Filled 2014-09-24: qty 16

## 2014-09-24 MED ORDER — ASPIRIN EC 81 MG PO TBEC
81.0000 mg | DELAYED_RELEASE_TABLET | Freq: Every day | ORAL | Status: DC
  Administered 2014-09-25 – 2014-09-26 (×2): 81 mg via ORAL
  Filled 2014-09-24 (×2): qty 1

## 2014-09-24 MED ORDER — POLYETHYLENE GLYCOL 3350 17 G PO PACK
17.0000 g | PACK | Freq: Every day | ORAL | Status: DC
  Filled 2014-09-24 (×2): qty 1

## 2014-09-24 MED ORDER — FUROSEMIDE 10 MG/ML IJ SOLN: 20.0000 mg | Freq: Every day | INTRAMUSCULAR | Status: DC

## 2014-09-24 MED ORDER — DIPHENHYDRAMINE HCL 25 MG PO TABS
25.0000 mg | ORAL_TABLET | Freq: Three times a day (TID) | ORAL | Status: DC
  Filled 2014-09-24: qty 1

## 2014-09-24 MED ORDER — SODIUM CHLORIDE 0.9 % IV SOLN: 250.0000 mL | INTRAVENOUS | Status: DC | PRN

## 2014-09-24 MED ORDER — BUDESONIDE-FORMOTEROL FUMARATE 160-4.5 MCG/ACT IN AERO
2.0000 | INHALATION_SPRAY | Freq: Two times a day (BID) | RESPIRATORY_TRACT | Status: DC
  Administered 2014-09-25 – 2014-09-26 (×3): 2 via RESPIRATORY_TRACT
  Filled 2014-09-24: qty 6

## 2014-09-24 MED ORDER — PREDNISONE 5 MG PO TABS
5.0000 mg | ORAL_TABLET | Freq: Every day | ORAL | Status: DC
  Administered 2014-09-25 – 2014-09-26 (×2): 5 mg via ORAL
  Filled 2014-09-24 (×2): qty 1

## 2014-09-24 MED ORDER — HEPARIN SODIUM (PORCINE) 5000 UNIT/ML IJ SOLN
5000.0000 [IU] | Freq: Three times a day (TID) | INTRAMUSCULAR | Status: DC
  Administered 2014-09-25 – 2014-09-26 (×5): 5000 [IU] via SUBCUTANEOUS
  Filled 2014-09-24 (×10): qty 1

## 2014-09-24 MED ORDER — MAGNESIUM OXIDE 400 (241.3 MG) MG PO TABS
400.0000 mg | ORAL_TABLET | Freq: Every day | ORAL | Status: DC
  Administered 2014-09-25 – 2014-09-26 (×2): 400 mg via ORAL
  Filled 2014-09-24 (×2): qty 1

## 2014-09-24 MED ORDER — FUROSEMIDE 10 MG/ML IJ SOLN
20.0000 mg | Freq: Every day | INTRAMUSCULAR | Status: DC
  Administered 2014-09-25 – 2014-09-26 (×2): 20 mg via INTRAVENOUS
  Filled 2014-09-24 (×2): qty 2

## 2014-09-24 MED ORDER — ATORVASTATIN CALCIUM 40 MG PO TABS
40.0000 mg | ORAL_TABLET | Freq: Every day | ORAL | Status: DC
  Administered 2014-09-25 (×2): 40 mg via ORAL
  Filled 2014-09-24 (×3): qty 1

## 2014-09-24 MED ORDER — PREDNISONE 20 MG PO TABS
40.0000 mg | ORAL_TABLET | Freq: Every day | ORAL | Status: DC
Start: 2014-09-25 — End: 2014-09-24

## 2014-09-24 MED ORDER — PANTOPRAZOLE SODIUM 40 MG PO TBEC
80.0000 mg | DELAYED_RELEASE_TABLET | Freq: Every day | ORAL | Status: DC
  Administered 2014-09-25 – 2014-09-26 (×2): 80 mg via ORAL
  Filled 2014-09-24 (×2): qty 2

## 2014-09-24 MED ORDER — ALBUTEROL SULFATE HFA 108 (90 BASE) MCG/ACT IN AERS: 2.0000 | INHALATION_SPRAY | Freq: Four times a day (QID) | RESPIRATORY_TRACT | Status: DC | PRN

## 2014-09-24 MED ORDER — CITALOPRAM HYDROBROMIDE 10 MG PO TABS
10.0000 mg | ORAL_TABLET | Freq: Every day | ORAL | Status: DC
  Administered 2014-09-25 – 2014-09-26 (×2): 10 mg via ORAL
  Filled 2014-09-24 (×2): qty 1

## 2014-09-24 NOTE — ED Provider Notes (Signed)
CSN: [id number]     Arrival date & time 09/24/14  2027 History   First MD Initiated Contact with Patient 09/24/14 2043     Chief Complaint  Patient presents with  . Shortness of Breath     (Consider location/radiation/quality/duration/timing/severity/associated sxs/prior Treatment) The history is provided by the patient and a relative.  Gabriel Wood is a 81 y.o. male hx of CAD, COPD, CHF, AAA here presenting with shortness of breath. Patient has been chronically short of breath and is on 2 L nasal cannula at baseline. Patient was recently admitted to the hospital for CHF and COPD exacerbation. Patient has been home for the last week or so. Over the last several days he got progressively short of breath and it's hard for him to get to the bathroom without getting short of breath. Has some nonproductive cough that is chronic. Denies any fevers or chills. He has been taking more Lasix for the last 3 days and sometimes his blood pressure runs a little low and son noticed that his oxygen has been low. Denies any nausea or vomiting.    Past Medical History  Diagnosis Date  . History of colon polyps   . Anemia   . Ulcer     history of Peptic Ulcer Disease  . IBS (irritable bowel syndrome)   . Anxiety   . BPH (benign prostatic hypertrophy)   . CAD (coronary artery disease)   . Atrial fibrillation   . AAA (abdominal aortic aneurysm)   . Chronic anticoagulation     On Coumadin therapy ( A Fib)  . Hypertension   . Hyperlipidemia   . History of DVT of lower extremity     pt does not recall this hx on 07/22/2013  . Anginal pain   . Transient ischemic attack (TIA) 07/2012  . Fall at home Dec. 2014  . CHF (congestive heart failure)   . COPD (chronic obstructive pulmonary disease)   . History of radiation therapy 06/30/13 completed      5/5 srs brain  . Family history of anesthesia complication     "son goes bezerk" (07/22/2013)  . On home oxygen therapy     "2L; w/activity only recently  & prn shortness of breath" (07/22/2013)  . H/O hiatal hernia   . Arthritis   . Kidney stone   . Melanoma Sept. 2013    ,Right  posterior shoulder  . Metastatic melanoma     [name] 07/22/2013  . GERD (gastroesophageal reflux disease)   . Stroke 2015    "weakness on right side since"  . S/P radiation therapy 06/24/13-06/30/13    Left iliac 20Gy/86fx   Past Surgical History  Procedure Laterality Date  . Appendectomy  1957  . Cholecystectomy  1973  . Shoulder open rotator cuff repair Bilateral     "twice on the right; once on the left"  . Coronary artery bypass graft  1999    CABG X4  . Tibia fracture surgery Left 2011    tibial ORIF by Dr. Sharol Given  . Ankle fusion Left 2011  . Lymph node biopsy    . Total shoulder replacement Left Dec.  2014  . Kidney stone surgery      "once"  . Joint replacement    . Cystoscopy w/ stone manipulation      "3-4 times"  . Tonsillectomy  1957  . Melanoma excision Right 05/2011; ~ 12/2011; 2014    shoulder;  neck; neck   Family History  Problem Relation  Age of Onset  . Cancer Mother     stomach cancer  . Heart disease Brother     After age 55  . Cancer Brother     Brain Tumor  . Hyperlipidemia Brother   . Hypertension Brother   . Heart disease Brother   . Hyperlipidemia Brother   . Hypertension Brother   . Heart disease Brother   . Hyperlipidemia Brother   . Hypertension Brother   . Hyperlipidemia Sister   . Cancer Daughter     Colon  . Hyperlipidemia Daughter   . Hyperlipidemia Son    History  Substance Use Topics  . Smoking status: Former Smoker -- 1.00 packs/day for 55 years    Types: Cigarettes    Quit date: 04/08/1996  . Smokeless tobacco: Never Used  . 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   All other systems reviewed and are negative.     Allergies  Morphine and related; Codeine;  and Buprenorphine hcl  Home Medications   Prior to Admission medications   Medication Sig Start Date End Date Taking? Authorizing Provider  albuterol (PROVENTIL HFA;VENTOLIN HFA) 108 (90 BASE) MCG/ACT inhaler Inhale 2 puffs into the lungs every 6 (six) hours as needed for wheezing or shortness of breath.   Yes Historical Provider, MD  aspirin EC 81 MG tablet Take 81 mg by mouth daily.   Yes Historical Provider, MD  atorvastatin (LIPITOR) 40 MG tablet Take 40 mg by mouth at bedtime.  02/12/11  Yes Historical Provider, MD  budesonide-formoterol (SYMBICORT) 160-4.5 MCG/ACT inhaler Inhale 2 puffs into the lungs 2 (two) times daily.   Yes Historical Provider, MD  citalopram (CELEXA) 10 MG tablet Take 10 mg by mouth daily.  03/07/11  Yes Historical Provider, MD  diphenhydrAMINE (BENADRYL) 25 MG tablet Take 25 mg by mouth 3 (three) times daily.   Yes Historical Provider, MD  fluticasone (FLONASE) 50 MCG/ACT nasal spray Place 1 spray into both nostrils daily.  02/18/14  Yes Historical Provider, MD  Fructose-Dextrose-Phosphor Acd ([employer] NAUSEA RELIEF PO) Take 1 tablet by mouth 2 (two) times daily as needed (nausea).   Yes Historical Provider, MD  furosemide (LASIX) 20 MG tablet Take 20 mg by mouth daily as needed (weight gain of 2 lbs or more).    Yes Historical Provider, MD  magnesium oxide (MAG-OX) 400 (241.3 MG) MG tablet Take 400 mg by mouth daily.  01/27/14  Yes Historical Provider, MD  meclizine (ANTIVERT) 25 MG tablet Take 25 mg by mouth daily.    Yes Historical Provider, MD  metoprolol succinate (TOPROL-XL) 25 MG 24 hr tablet Take 12.5 mg by mouth daily.   Yes Historical Provider, MD  Multiple Vitamin (MULTIVITAMIN WITH MINERALS) TABS Take 1 tablet by mouth at bedtime. Men's One A Day   Yes Historical Provider, MD  nitroGLYCERIN (NITROSTAT) 0.4 MG SL tablet Place 0.4 mg under the tongue every 5 (five) minutes as needed for chest pain.   Yes Historical Provider, MD  omeprazole (PRILOSEC) 40 MG capsule  Take 40 mg by mouth daily.   Yes Historical Provider, MD  polyethylene glycol (MIRALAX / GLYCOLAX) packet Take 17 g by mouth daily before supper. Mix in 8 oz of liquid and drink   Yes Historical Provider, MD  potassium chloride SA (K-DUR,KLOR-CON) 20 MEQ tablet Take 40 mEq  by mouth 2 (two) times daily.   Yes Historical Provider, MD  predniSONE (DELTASONE) 5 MG tablet Take 5 mg by mouth daily. 10/28/13  Yes Historical Provider, MD  tiotropium (SPIRIVA) 18 MCG inhalation capsule Place 18 mcg into inhaler and inhale daily.   Yes Historical Provider, MD  OXYGEN-HELIUM IN Inhale 2 % into the lungs at bedtime. daily as needed. Frequency:PHARMDIR   Dosage:0.0     Instructions:  Note:Diagnosis: COPD HT:5'9 WT: 89KG Home O2 @ 2LPM via [place], as needed to keep O2 >92%. Concentrator, supplies, & Portable tank for transport. Sats on RA @ rest 06/09/12   Historical Provider, MD   BP 116/67 mmHg  Pulse 95  Temp(Src) 97.5 F (36.4 C) (Oral)  Resp 27  Ht 5\' 10"  (1.778 m)  Wt 155 lb (70.308 kg)  BMI 22.24 kg/m2  SpO2 95% Physical Exam  Constitutional: He is oriented to person, place, and time.  Chronically ill   HENT:  Head: Normocephalic.  Mouth/Throat: Oropharynx is clear and moist.  Eyes: Conjunctivae are normal. Pupils are equal, round, and reactive to light.  Neck: Normal range of motion. Neck supple.  Cardiovascular: Normal rate, regular rhythm and normal heart sounds.   Pulmonary/Chest:  Diminished throughout. Minimal wheezing and crackles.   Abdominal: Soft. Bowel sounds are normal. He exhibits no distension. There is no tenderness. There is no rebound.  Musculoskeletal: Normal range of motion.  1+ edema bilaterally. No calf tenderness   Neurological: He is alert and oriented to person, place, and time.  Skin: Skin is warm and dry.  Psychiatric: He has a normal mood and affect. His behavior is normal. Judgment and thought content normal.  Nursing note and vitals reviewed.   ED Course   Procedures (including critical care time) Labs Review Labs Reviewed  CBC - Abnormal; Notable for the following:    Hemoglobin 10.7 (*)    HCT 35.0 (*)    MCV 72.9 (*)    MCH 22.3 (*)    RDW 19.9 (*)    All other components within normal limits  BASIC METABOLIC PANEL - Abnormal; Notable for the following:    Potassium 5.6 (*)    Glucose, Bld 111 (*)    GFR calc non Af Amer 51 (*)    GFR calc Af Amer 59 (*)    All other components within normal limits  BRAIN NATRIURETIC PEPTIDE  I-STAT TROPOININ, ED  I-STAT CHEM 8, ED    Imaging Review Dg Chest Port 1 View  09/24/2014   CLINICAL DATA:  Increased shortness of breath over the past 2 weeks. Seen for same on June 5th. Nausea and hot flashes. Home oxygen.  EXAM: PORTABLE CHEST - 1 VIEW  COMPARISON:  09/19/2014  FINDINGS: Postoperative changes in the mediastinum. Shallow inspiration with elevation of the right hemidiaphragm. Diffuse interstitial pattern to the lungs has been present previously and may represent chronic interstitial process such as fibrosis. Superimposed infection or edema could also have this appearance. Normal heart size and pulmonary vascularity. No blunting of costophrenic angles. No pneumothorax. Calcified and tortuous aorta. Postoperative changes in the right neck base and left shoulder. Similar appearance to prior study.  IMPRESSION: Diffuse interstitial pattern to the lungs suggesting chronic process possibly with superimposed acute interstitial edema or infiltration. No focal consolidation.   Electronically Signed   By: Lucienne Capers M.D.   On: 09/24/2014 21:14     EKG Interpretation   Date/Time:  Saturday September 24 2014 20:37:47 EDT Ventricular Rate:  95 PR Interval:    QRS Duration: 96 QT Interval:  384 QTC Calculation: 482 R Axis:   32 Text Interpretation:  Atrial fibrillation with premature ventricular or  aberrantly conducted complexes Incomplete right bundle branch block ST \\T \  T wave abnormality,  consider inferior ischemia ST \\T \ T wave abnormality,  consider anterior ischemia Prolonged QT Abnormal ECG No significant change  since last tracing Confirmed by [place]  MD, Jamy Whyte ([id number]) on 09/24/2014  8:44:01 PM      MDM   Final diagnoses:  None    Gabriel Wood is a 81 y.o. male here with SOB. Likely COPD exacerbation. Slightly hypotensive on arrival likely from taking more lasix. Will get labs, CXR, trop. Will give nebs and steroids and likely admit.   10:40 PM K 5.6, likely hemolyzed. Will repeat. CXR showed COPD. Given nebs, steroids. Consulted for admission. Discussed with Dr. Alcario Drought, who will see patient.  11:09 PM  Dr. Alcario Drought to admit.      Wandra Arthurs, MD 09/24/14 [phone]

## 2014-09-24 NOTE — H&P (Signed)
[facility] History and Physical  Gabriel Wood SWN:[id number] DOB: [birth year] DOA: 09/24/2014  Referring physician: EDP PCP: Gabriel Huh, MD   Chief Complaint: SOB   HPI: Gabriel Wood is a 81 y.o. male who presents to the ED with c/o SOB.  He was just admitted to our service 12 days ago for respiratory failure.  Was diuresed 2 lbs of fluid and had subsequent resolution back to baseline for his SOB.  Since getting home he has been becoming progressively worse over the past 12 days.  For the past 3 days he has been on lasix daily; however, his SOB has continued to worsen he states.  His weight is the same as his discharge weight last time  Review of Systems: Systems reviewed.  As above, otherwise negative  Past Medical History  Diagnosis Date  . History of colon polyps   . Anemia   . Ulcer     history of Peptic Ulcer Disease  . IBS (irritable bowel syndrome)   . Anxiety   . BPH (benign prostatic hypertrophy)   . CAD (coronary artery disease)   . Atrial fibrillation   . AAA (abdominal aortic aneurysm)   . Chronic anticoagulation     On Coumadin therapy ( A Fib)  . Hypertension   . Hyperlipidemia   . History of DVT of lower extremity     pt does not recall this hx on 07/22/2013  . Anginal pain   . Transient ischemic attack (TIA) 07/2012  . Fall at home Dec. 2014  . CHF (congestive heart failure)   . COPD (chronic obstructive pulmonary disease)   . History of radiation therapy 06/30/13 completed      5/5 srs brain  . Family history of anesthesia complication     "son goes bezerk" (07/22/2013)  . On home oxygen therapy     "2L; w/activity only recently & prn shortness of breath" (07/22/2013)  . H/O hiatal hernia   . Arthritis   . Kidney stone   . Melanoma Sept. 2013    ,Right  posterior shoulder  . Metastatic melanoma     [name] 07/22/2013  . GERD (gastroesophageal reflux disease)   . Stroke 2015    "weakness on right side since"  . S/P radiation  therapy 06/24/13-06/30/13    Left iliac 20Gy/7fx   Past Surgical History  Procedure Laterality Date  . Appendectomy  1957  . Cholecystectomy  1973  . Shoulder open rotator cuff repair Bilateral     "twice on the right; once on the left"  . Coronary artery bypass graft  1999    CABG X4  . Tibia fracture surgery Left 2011    tibial ORIF by Dr. Sharol Given  . Ankle fusion Left 2011  . Lymph node biopsy    . Total shoulder replacement Left Dec.  2014  . Kidney stone surgery      "once"  . Joint replacement    . Cystoscopy w/ stone manipulation      "3-4 times"  . Tonsillectomy  1957  . Melanoma excision Right 05/2011; ~ 12/2011; 2014    shoulder;  neck; neck   Social History:  reports that he quit smoking about 18 years ago. His smoking use included Cigarettes. He has a 55 pack-year smoking history. He has never used smokeless tobacco. He reports that he drinks alcohol. He reports that he does not use illicit drugs.  Allergies  Allergen Reactions  . Morphine And Related Palpitations  . Codeine  Nausea Only  . Buprenorphine Hcl Palpitations    Family History  Problem Relation Age of Onset  . Cancer Mother     stomach cancer  . Heart disease Brother     After age 28  . Cancer Brother     Brain Tumor  . Hyperlipidemia Brother   . Hypertension Brother   . Heart disease Brother   . Hyperlipidemia Brother   . Hypertension Brother   . Heart disease Brother   . Hyperlipidemia Brother   . Hypertension Brother   . Hyperlipidemia Sister   . Cancer Daughter     Colon  . Hyperlipidemia Daughter   . Hyperlipidemia Son      Prior to Admission medications   Medication Sig Start Date End Date Taking? Authorizing Provider  albuterol (PROVENTIL HFA;VENTOLIN HFA) 108 (90 BASE) MCG/ACT inhaler Inhale 2 puffs into the lungs every 6 (six) hours as needed for wheezing or shortness of breath.   Yes Historical Provider, MD  aspirin EC 81 MG tablet Take 81 mg by mouth daily.   Yes Historical  Provider, MD  atorvastatin (LIPITOR) 40 MG tablet Take 40 mg by mouth at bedtime.  02/12/11  Yes Historical Provider, MD  budesonide-formoterol (SYMBICORT) 160-4.5 MCG/ACT inhaler Inhale 2 puffs into the lungs 2 (two) times daily.   Yes Historical Provider, MD  citalopram (CELEXA) 10 MG tablet Take 10 mg by mouth daily.  03/07/11  Yes Historical Provider, MD  diphenhydrAMINE (BENADRYL) 25 MG tablet Take 25 mg by mouth 3 (three) times daily.   Yes Historical Provider, MD  fluticasone (FLONASE) 50 MCG/ACT nasal spray Place 1 spray into both nostrils daily.  02/18/14  Yes Historical Provider, MD  Fructose-Dextrose-Phosphor Acd ([employer] NAUSEA RELIEF PO) Take 1 tablet by mouth 2 (two) times daily as needed (nausea).   Yes Historical Provider, MD  furosemide (LASIX) 20 MG tablet Take 20 mg by mouth daily as needed (weight gain of 2 lbs or more).    Yes Historical Provider, MD  magnesium oxide (MAG-OX) 400 (241.3 MG) MG tablet Take 400 mg by mouth daily.  01/27/14  Yes Historical Provider, MD  meclizine (ANTIVERT) 25 MG tablet Take 25 mg by mouth daily.    Yes Historical Provider, MD  metoprolol succinate (TOPROL-XL) 25 MG 24 hr tablet Take 12.5 mg by mouth daily.   Yes Historical Provider, MD  Multiple Vitamin (MULTIVITAMIN WITH MINERALS) TABS Take 1 tablet by mouth at bedtime. Men's One A Day   Yes Historical Provider, MD  nitroGLYCERIN (NITROSTAT) 0.4 MG SL tablet Place 0.4 mg under the tongue every 5 (five) minutes as needed for chest pain.   Yes Historical Provider, MD  omeprazole (PRILOSEC) 40 MG capsule Take 40 mg by mouth daily.   Yes Historical Provider, MD  polyethylene glycol (MIRALAX / GLYCOLAX) packet Take 17 g by mouth daily before supper. Mix in 8 oz of liquid and drink   Yes Historical Provider, MD  potassium chloride SA (K-DUR,KLOR-CON) 20 MEQ tablet Take 40 mEq by mouth 2 (two) times daily.   Yes Historical Provider, MD  predniSONE (DELTASONE) 5 MG tablet Take 5 mg by mouth daily. 10/28/13  Yes  Historical Provider, MD  tiotropium (SPIRIVA) 18 MCG inhalation capsule Place 18 mcg into inhaler and inhale daily.   Yes Historical Provider, MD  OXYGEN-HELIUM IN Inhale 2 % into the lungs at bedtime. daily as needed. Frequency:PHARMDIR   Dosage:0.0     Instructions:  Note:Diagnosis: COPD HT:5'9 WT: 89KG Home O2 @  2LPM via [place], as needed to keep O2 >92%. Concentrator, supplies, & Portable tank for transport. Sats on RA @ rest 06/09/12   Historical Provider, MD   Physical Exam: Filed Vitals:   09/24/14 2100  BP: 116/67  Pulse:   Temp:   Resp:     BP 116/67 mmHg  Pulse 95  Temp(Src) 97.5 F (36.4 C) (Oral)  Resp 27  Ht 5\' 10"  (1.778 m)  Wt 70.308 kg (155 lb)  BMI 22.24 kg/m2  SpO2 95%  General Appearance:    Alert, oriented, no distress, appears stated age  Head:    Normocephalic, atraumatic  Eyes:    PERRL, EOMI, sclera non-icteric        Nose:   Nares without drainage or epistaxis. Mucosa, turbinates normal  Throat:   Moist mucous membranes. Oropharynx without erythema or exudate.  Neck:   Supple. No carotid bruits.  No thyromegaly.  No lymphadenopathy.   Back:     No CVA tenderness, no spinal tenderness  Lungs:     Clear to auscultation bilaterally, without wheezes, rhonchi or rales  Chest wall:    No tenderness to palpitation  Heart:    Regular rate and rhythm without murmurs, gallops, rubs  Abdomen:     Soft, non-tender, nondistended, normal bowel sounds, no organomegaly  Genitalia:    deferred  Rectal:    deferred  Extremities:   1+ pitting edema BLE  Pulses:   2+ and symmetric all extremities  Skin:   Skin color, texture, turgor normal, no rashes or lesions  Lymph nodes:   Cervical, supraclavicular, and axillary nodes normal  Neurologic:   CNII-XII intact. Normal strength, sensation and reflexes      throughout    Labs on Admission:  Basic Metabolic Panel:  Recent Labs Lab 09/24/14 2055  NA 137  K 5.6*  CL 104  CO2 25  GLUCOSE 111*  BUN 20  CREATININE 1.22   CALCIUM 9.7   Liver Function Tests: No results for input(s): AST, ALT, ALKPHOS, BILITOT, PROT, ALBUMIN in the last 168 hours. No results for input(s): LIPASE, AMYLASE in the last 168 hours. No results for input(s): AMMONIA in the last 168 hours. CBC:  Recent Labs Lab 09/24/14 2055  WBC 9.5  HGB 10.7*  HCT 35.0*  MCV 72.9*  PLT 252   Cardiac Enzymes: No results for input(s): CKTOTAL, CKMB, CKMBINDEX, TROPONINI in the last 168 hours.  BNP (last 3 results)  Recent Labs  12/31/13 0851  PROBNP 970.5*   CBG: No results for input(s): GLUCAP in the last 168 hours.  Radiological Exams on Admission: Dg Chest Port 1 View  09/24/2014   CLINICAL DATA:  Increased shortness of breath over the past 2 weeks. Seen for same on June 5th. Nausea and hot flashes. Home oxygen.  EXAM: PORTABLE CHEST - 1 VIEW  COMPARISON:  09/19/2014  FINDINGS: Postoperative changes in the mediastinum. Shallow inspiration with elevation of the right hemidiaphragm. Diffuse interstitial pattern to the lungs has been present previously and may represent chronic interstitial process such as fibrosis. Superimposed infection or edema could also have this appearance. Normal heart size and pulmonary vascularity. No blunting of costophrenic angles. No pneumothorax. Calcified and tortuous aorta. Postoperative changes in the right neck base and left shoulder. Similar appearance to prior study.  IMPRESSION: Diffuse interstitial pattern to the lungs suggesting chronic process possibly with superimposed acute interstitial edema or infiltration. No focal consolidation.   Electronically Signed   By: Oren Beckmann.D.  On: 09/24/2014 21:14    EKG: Independently reviewed.  Assessment/Plan Principal Problem:   Acute on chronic respiratory failure with hypoxia Active Problems:   Hypertension   Hyperlipidemia   1. Acute on chronic respiratory failure with hypoxia - could be fluid overload again like it was last week, but not  entirely confident of this this time around.  Now requiring 4L to maintain sats. 1. HF pathway 2. 20mg  lasix daily given the possibility of acute edema on chronic findings on CXR. 1. I think what may be happening however, is that the patient has developed at least a mild degree of pulmonary HTN that we may be treating. 3. Probably would benefit from pulm consult 1. Has known significantly increased DLCO on PFTs done earlier this year 4. Lung sounds are clear, no wheezing 5. ? If his fibrosis either from chemos or radiation may be worsening. 6. Will continue his chronic 5mg  prednisone daily for now, dont really hear any wheezing to make me want to put him on higher dose prednisone right now. 2. HTN - continue home meds 3. HLD - continue home meds  Code Status: Full  Family Communication: Family at bedside Disposition Plan: Admit to obs   Time spent: 70 min  Jakia Kennebrew M. [facility] Pager [phone]  If 7AM-7PM, please contact the day team taking care of the patient [email or web address] Password [facility] 09/24/2014, 11:02 PM

## 2014-09-24 NOTE — ED Notes (Signed)
Pt in room in gown, on 2L [place], o2 sat 94% on RA. Pt able to complete sentences. Dr Darl Householder in room.

## 2014-09-24 NOTE — ED Notes (Signed)
Pt c/o increased sob over past 2 weeks. Seen for same June 5th. Pt also c/o nausea, hot flashes. Pt wears 2L home o2.

## 2014-09-25 ENCOUNTER — Encounter (HOSPITAL_COMMUNITY): Payer: Self-pay

## 2014-09-25 DIAGNOSIS — I509 Heart failure, unspecified: Secondary | ICD-10-CM | POA: Diagnosis present

## 2014-09-25 DIAGNOSIS — C799 Secondary malignant neoplasm of unspecified site: Secondary | ICD-10-CM

## 2014-09-25 DIAGNOSIS — K219 Gastro-esophageal reflux disease without esophagitis: Secondary | ICD-10-CM | POA: Diagnosis present

## 2014-09-25 DIAGNOSIS — Z85820 Personal history of malignant melanoma of skin: Secondary | ICD-10-CM | POA: Diagnosis not present

## 2014-09-25 DIAGNOSIS — R0602 Shortness of breath: Secondary | ICD-10-CM | POA: Diagnosis present

## 2014-09-25 DIAGNOSIS — J9621 Acute and chronic respiratory failure with hypoxia: Principal | ICD-10-CM

## 2014-09-25 DIAGNOSIS — E785 Hyperlipidemia, unspecified: Secondary | ICD-10-CM | POA: Diagnosis present

## 2014-09-25 DIAGNOSIS — C7800 Secondary malignant neoplasm of unspecified lung: Secondary | ICD-10-CM | POA: Diagnosis present

## 2014-09-25 DIAGNOSIS — D649 Anemia, unspecified: Secondary | ICD-10-CM | POA: Diagnosis present

## 2014-09-25 DIAGNOSIS — Z7982 Long term (current) use of aspirin: Secondary | ICD-10-CM | POA: Diagnosis not present

## 2014-09-25 DIAGNOSIS — N400 Enlarged prostate without lower urinary tract symptoms: Secondary | ICD-10-CM | POA: Diagnosis present

## 2014-09-25 DIAGNOSIS — I272 Other secondary pulmonary hypertension: Secondary | ICD-10-CM | POA: Diagnosis present

## 2014-09-25 DIAGNOSIS — F419 Anxiety disorder, unspecified: Secondary | ICD-10-CM | POA: Diagnosis present

## 2014-09-25 DIAGNOSIS — Z79899 Other long term (current) drug therapy: Secondary | ICD-10-CM | POA: Diagnosis not present

## 2014-09-25 DIAGNOSIS — C7951 Secondary malignant neoplasm of bone: Secondary | ICD-10-CM | POA: Diagnosis present

## 2014-09-25 DIAGNOSIS — K589 Irritable bowel syndrome without diarrhea: Secondary | ICD-10-CM | POA: Diagnosis present

## 2014-09-25 DIAGNOSIS — Z923 Personal history of irradiation: Secondary | ICD-10-CM | POA: Diagnosis not present

## 2014-09-25 DIAGNOSIS — J441 Chronic obstructive pulmonary disease with (acute) exacerbation: Secondary | ICD-10-CM | POA: Diagnosis present

## 2014-09-25 DIAGNOSIS — I10 Essential (primary) hypertension: Secondary | ICD-10-CM

## 2014-09-25 DIAGNOSIS — Z7901 Long term (current) use of anticoagulants: Secondary | ICD-10-CM | POA: Diagnosis not present

## 2014-09-25 DIAGNOSIS — I080 Rheumatic disorders of both mitral and aortic valves: Secondary | ICD-10-CM | POA: Diagnosis present

## 2014-09-25 DIAGNOSIS — I482 Chronic atrial fibrillation: Secondary | ICD-10-CM

## 2014-09-25 DIAGNOSIS — Z87891 Personal history of nicotine dependence: Secondary | ICD-10-CM | POA: Diagnosis not present

## 2014-09-25 DIAGNOSIS — Z8673 Personal history of transient ischemic attack (TIA), and cerebral infarction without residual deficits: Secondary | ICD-10-CM | POA: Diagnosis not present

## 2014-09-25 DIAGNOSIS — C7931 Secondary malignant neoplasm of brain: Secondary | ICD-10-CM | POA: Diagnosis present

## 2014-09-25 DIAGNOSIS — C439 Malignant melanoma of skin, unspecified: Secondary | ICD-10-CM | POA: Diagnosis present

## 2014-09-25 DIAGNOSIS — I714 Abdominal aortic aneurysm, without rupture: Secondary | ICD-10-CM | POA: Diagnosis present

## 2014-09-25 DIAGNOSIS — M1990 Unspecified osteoarthritis, unspecified site: Secondary | ICD-10-CM | POA: Diagnosis present

## 2014-09-25 DIAGNOSIS — Z9981 Dependence on supplemental oxygen: Secondary | ICD-10-CM | POA: Diagnosis not present

## 2014-09-25 DIAGNOSIS — I4891 Unspecified atrial fibrillation: Secondary | ICD-10-CM | POA: Diagnosis present

## 2014-09-25 DIAGNOSIS — I2510 Atherosclerotic heart disease of native coronary artery without angina pectoris: Secondary | ICD-10-CM | POA: Diagnosis present

## 2014-09-25 DIAGNOSIS — Z86718 Personal history of other venous thrombosis and embolism: Secondary | ICD-10-CM | POA: Diagnosis not present

## 2014-09-25 DIAGNOSIS — Z951 Presence of aortocoronary bypass graft: Secondary | ICD-10-CM | POA: Diagnosis not present

## 2014-09-25 LAB — BASIC METABOLIC PANEL
ANION GAP: 7 (ref 5–15)
BUN: 18 mg/dL (ref 6–20)
CO2: 25 mmol/L (ref 22–32)
Calcium: 9 mg/dL (ref 8.9–10.3)
Chloride: 103 mmol/L (ref 101–111)
Creatinine, Ser: 0.94 mg/dL (ref 0.61–1.24)
GFR calc Af Amer: >60 mL/min (ref >60)
GFR calc non Af Amer: >60 mL/min (ref >60)
Glucose, Bld: 150 mg/dL — ABNORMAL HIGH (ref 65–99)
Potassium: 4.2 mmol/L (ref 3.5–5.1)
Sodium: 135 mmol/L (ref 135–145)

## 2014-09-25 MED ORDER — DIPHENHYDRAMINE HCL 25 MG PO CAPS
25.0000 mg | ORAL_CAPSULE | Freq: Three times a day (TID) | ORAL | Status: DC
  Administered 2014-09-25 – 2014-09-26 (×5): 25 mg via ORAL
  Filled 2014-09-25 (×7): qty 1

## 2014-09-25 MED ORDER — ALBUTEROL SULFATE (2.5 MG/3ML) 0.083% IN NEBU: 2.5000 mg | INHALATION_SOLUTION | Freq: Four times a day (QID) | RESPIRATORY_TRACT | Status: DC | PRN

## 2014-09-25 NOTE — Progress Notes (Addendum)
[facility]                                                                              Patient Demographics  Elyjah Hazan, is a 81 y.o. male, DOB - [birth year], POE:[id number]  Admit date - 09/24/2014   Admitting Physician Etta Quill, DO  Outpatient Primary MD for the patient is Simona Huh, MD  LOS -    Chief Complaint  Patient presents with  . Shortness of Breath       Brief HPI   Patient is a 81 year old male with IBS, constipation, CAD, chronic CHF, COPD, metastatic melanoma, hypertension, hyperlipidemia presented to ED with shortness of breath. Patient was recently discharged on 09/12/14 when he had presented with shortness of breath and went to extensive workup including 2-D echo, CTA of the chest. Patient was diuresed with IV Lasix, he was able to work well with physical therapy and went home with home PT. Patient reports that he has been getting off and on shortness of breath and has been taking Lasix daily for the past 3 days. He also reported constipation issues.   Assessment & Plan    Principal Problem:   Acute on chronic respiratory failure with hypoxia- much improving today, O2 sats 92% on room air, currently no wheezing - Recent 2-D echo 6/16 had shown EF of 50-55%, no regional wall motion abnormalities, moderate aortic stenosis - CT angiogram of the chest on 6/6 showed no PE, pulmonary edema, atypical infection cannot be ruled out - Continue IV Lasix, strict I's and O's and daily weight - Continue Symbicort, albuterol as needed, prednisone, received Solu-Medrol 1 in ED. Outpatient follow-up with pulmonology -Troponin point of care negative, BNP slightly elevated at 174.7   Active Problems:   Atrial fibrillation -Currently rate controlled - Currently on aspirin, not on any anticoagulation outpatient     CAD (coronary artery disease) - Currently no chest pain or shortness of breath, continue aspirin, beta blocker, Lipitor,  Lasix  Hypertension - Currently stable, continue Lasix, metoprolol    Hyperlipidemia - Continue Lipitor  COPD with underlying metastatic melanoma to the lungs - Continue albuterol, Symbicort, O2, prednisone  Chronic anemia  - H&H currently at baseline, follow closely   Code Status: Full code  Family Communication: Discussed in detail with the patient, all imaging results, lab results explained to the patient    Disposition Plan: Hopefully tomorrow if stable  Time Spent in minutes   25  minutes  Procedures  None  Consults   None   DVT Prophylaxis  heparin   Medications  Scheduled Meds: . aspirin EC  81 mg Oral Daily  . atorvastatin  40 mg Oral QHS  . budesonide-formoterol  2 puff Inhalation BID  . citalopram  10 mg Oral Daily  . diphenhydrAMINE  25 mg Oral TID  . fluticasone  1 spray Each Nare Daily  . furosemide  20 mg Intravenous Daily  . heparin  5,000 Units Subcutaneous 3 times per day  . magnesium oxide  400 mg Oral Daily  . meclizine  25 mg Oral Daily  . metoprolol succinate  12.5 mg Oral Daily  .  pantoprazole  80 mg Oral Daily  . polyethylene glycol  17 g Oral QAC supper  . predniSONE  5 mg Oral Daily  . sodium chloride  3 mL Intravenous Q12H  . tiotropium  18 mcg Inhalation Daily   Continuous Infusions:  PRN Meds:.sodium chloride, acetaminophen, albuterol, ondansetron (ZOFRAN) IV, sodium chloride   Antibiotics   Anti-infectives    None        Subjective:   Lizzie Argabright was seen and examined today.  He is a lot better, has constipation issues. Otherwise currently no chest pain or shortness of breath.  Patient denies dizziness, chest pain, shortness of breath, abdominal pain, N/V/D/C, new weakness, numbess, tingling. No acute events overnight.  Afebrile   Objective:   Blood pressure 122/54, pulse 81, temperature 97.7 F (36.5 C), temperature source Oral, resp. rate 18, height 5\' 8"  (1.727 m), weight 70.489 kg (155 lb 6.4 oz), SpO2 92  %.  Wt Readings from Last 3 Encounters:  09/25/14 70.489 kg (155 lb 6.4 oz)  09/12/14 71.5 kg (157 lb 10.1 oz)  07/18/14 73.392 kg (161 lb 12.8 oz)     Intake/Output Summary (Last 24 hours) at 09/25/14 1135 Last data filed at 09/25/14 0600  Gross per 24 hour  Intake    240 ml  Output    125 ml  Net    115 ml    Exam  General: Alert and oriented x 3, NAD  HEENT:  PERRLA, EOMI, Anicteric Sclera, mucous membranes moist.   Neck: Supple, no JVD, no masses  CVS: S1 S2 auscultated, no rubs, murmurs or gallops. Regular rate and rhythm.  Respiratory: Clear to auscultation bilaterally, no wheezing, rales or rhonchi  Abdomen: Soft, nontender, nondistended, + bowel sounds  Ext: no cyanosis clubbing or edema  Neuro: AAOx3, Cr N's II- XII. Strength 5/5 upper and lower extremities bilaterally  Skin: No rashes  Psych: Normal affect and demeanor, alert and oriented x3    Data Review   Micro Results No results found for this or any previous visit (from the past 240 hour(s)).  Radiology Reports Dg Chest 2 View  09/19/2014   CLINICAL DATA:  81 year old male with shortness of breath for several weeks. Subsequent encounter.  EXAM: CHEST  2 VIEW  COMPARISON:  09/11/2014 and 06/08/2013 chest x-ray. 09/12/2014 chest CT.  FINDINGS: Chronic elevated right hemidiaphragm. Post CABG. Mild cardiomegaly. Calcified tortuous aorta.  Central pulmonary vascular prominence.  Increased interstitial markings throughout the left lung similar to the recent chest x-ray although different than remote chest x-ray of 06/08/2013. Question infectious infiltrate versus asymmetric pulmonary edema.  IMPRESSION: Increased interstitial markings throughout the left lung similar to the recent chest x-ray although different than remote chest x-ray of 06/08/2013. Question infectious infiltrate versus asymmetric pulmonary edema.   Electronically Signed   By: Genia Del M.D.   On: 09/19/2014 11:34   Dg Chest 2  View  09/12/2014   CLINICAL DATA:  Shortness of breath.  EXAM: CHEST  2 VIEW  COMPARISON:  Chest CT 06/27/2014  FINDINGS: There is elevation of right hemidiaphragm with mild adjacent atelectasis. Patient is post median sternotomy. The heart is at the upper limits of normal in size. Mild vascular congestion without overt edema no large pleural effusion or pneumothorax. No confluent airspace disease. Surgical clips in the right neck. Left proximal humeral prosthesis, alignment unchanged from prior CT.  IMPRESSION: 1. Mild vascular congestion. 2. Unchanged elevation of right hemidiaphragm with adjacent atelectasis.   Electronically Signed  By: Jeb Levering M.D.   On: 09/12/2014 00:56   Ct Angio Chest Pe W/cm &/or Wo Cm  09/12/2014   CLINICAL DATA:  Increased shortness of breath for 5 days.  EXAM: CT ANGIOGRAPHY CHEST WITH CONTRAST  TECHNIQUE: Multidetector CT imaging of the chest was performed using the standard protocol during bolus administration of intravenous contrast. Multiplanar CT image reconstructions and MIPs were obtained to evaluate the vascular anatomy.  CONTRAST:  71mL OMNIPAQUE IOHEXOL 350 MG/ML SOLN  COMPARISON:  Chest CT 06/27/2014  FINDINGS: There are no filling defects within the pulmonary arteries to suggest pulmonary embolus.  Dense atherosclerosis of thoracic aorta without aneurysm. Patient is post CABG. Dense calcification of the native coronary arteries. There is left atrial enlargement that appears similar to prior exam. No mediastinal or hilar adenopathy. No pleural or pericardial effusion. There is elevation of right hemidiaphragm.  Emphysema again seen. There are diffuse ground-glass opacities, left greater than right with smooth septal thickening. Findings suggest pulmonary edema. Atelectasis or scarring at the right lung base adjacent elevated right hemidiaphragm. Atelectasis in the right middle lobe as well.  Low-density lesion in the left hepatic lobe consistent with cyst,  unchanged. Unchanged 1.3 cm right adrenal nodule. Dense atherosclerosis of the upper abdominal vasculature. Nonobstructing right renal stone.  Left shoulder arthroplasty unchanged in alignment. The bones are under mineralized. There are no lytic or destructive osseous lesions pneumothorax. Multiple surgical clips in the right neck.  Review of the MIP images confirms the above findings.  IMPRESSION: 1. No pulmonary embolus. 2. Findings suspicious for pulmonary edema. Atypical infection could have a similar appearance but is felt less likely given the smooth septal thickening. 3. Unchanged right adrenal nodule. Unchanged elevation of right hemidiaphragm.   Electronically Signed   By: Jeb Levering M.D.   On: 09/12/2014 02:12   Dg Chest Port 1 View  09/24/2014   CLINICAL DATA:  Increased shortness of breath over the past 2 weeks. Seen for same on June 5th. Nausea and hot flashes. Home oxygen.  EXAM: PORTABLE CHEST - 1 VIEW  COMPARISON:  09/19/2014  FINDINGS: Postoperative changes in the mediastinum. Shallow inspiration with elevation of the right hemidiaphragm. Diffuse interstitial pattern to the lungs has been present previously and may represent chronic interstitial process such as fibrosis. Superimposed infection or edema could also have this appearance. Normal heart size and pulmonary vascularity. No blunting of costophrenic angles. No pneumothorax. Calcified and tortuous aorta. Postoperative changes in the right neck base and left shoulder. Similar appearance to prior study.  IMPRESSION: Diffuse interstitial pattern to the lungs suggesting chronic process possibly with superimposed acute interstitial edema or infiltration. No focal consolidation.   Electronically Signed   By: Lucienne Capers M.D.   On: 09/24/2014 21:14    CBC  Recent Labs Lab 09/24/14 2055 09/24/14 2304  WBC 9.5  --   HGB 10.7* 11.9*  HCT 35.0* 35.0*  PLT 252  --   MCV 72.9*  --   MCH 22.3*  --   MCHC 30.6  --   RDW 19.9*   --     Chemistries   Recent Labs Lab 09/24/14 2055 09/24/14 2304 09/25/14 0347  NA 137 136 135  K 5.6* 4.6 4.2  CL 104 102 103  CO2 25  --  25  GLUCOSE 111* 116* 150*  BUN 20 22* 18  CREATININE 1.22 1.10 0.94  CALCIUM 9.7  --  9.0   ------------------------------------------------------------------------------------------------------------------ estimated creatinine clearance is 52.6 mL/min (by C-G  formula based on Cr of 0.94). ------------------------------------------------------------------------------------------------------------------ No results for input(s): HGBA1C in the last 72 hours. ------------------------------------------------------------------------------------------------------------------ No results for input(s): CHOL, HDL, LDLCALC, TRIG, CHOLHDL, LDLDIRECT in the last 72 hours. ------------------------------------------------------------------------------------------------------------------ No results for input(s): TSH, T4TOTAL, T3FREE, THYROIDAB in the last 72 hours.  Invalid input(s): FREET3 ------------------------------------------------------------------------------------------------------------------ No results for input(s): VITAMINB12, FOLATE, FERRITIN, TIBC, IRON, RETICCTPCT in the last 72 hours.  Coagulation profile No results for input(s): INR, PROTIME in the last 168 hours.  No results for input(s): DDIMER in the last 72 hours.  Cardiac Enzymes No results for input(s): CKMB, TROPONINI, MYOGLOBIN in the last 168 hours.  Invalid input(s): CK ------------------------------------------------------------------------------------------------------------------ Invalid input(s): POCBNP  No results for input(s): GLUCAP in the last 72 hours.   RAI,RIPUDEEP M.D. [facility] 09/25/2014, 11:35 AM  Pager: [phone]   Between 7am to 7pm - call Pager - [phone]  After 7pm go to [email or web address] - password [email or web address]  Call night coverage person  covering after 7pm

## 2014-09-26 LAB — BASIC METABOLIC PANEL
Anion gap: 7 (ref 5–15)
BUN: 18 mg/dL (ref 6–20)
CHLORIDE: 100 mmol/L — AB (ref 101–111)
CO2: 29 mmol/L (ref 22–32)
CREATININE: 0.91 mg/dL (ref 0.61–1.24)
Calcium: 8.9 mg/dL (ref 8.9–10.3)
GFR calc Af Amer: >60 mL/min (ref >60)
GFR calc non Af Amer: >60 mL/min (ref >60)
Glucose, Bld: 93 mg/dL (ref 65–99)
Potassium: 3.7 mmol/L (ref 3.5–5.1)
Sodium: 136 mmol/L (ref 135–145)

## 2014-09-26 MED ORDER — FUROSEMIDE 20 MG PO TABS: 20.0000 mg | ORAL_TABLET | Freq: Every day | ORAL | Status: AC

## 2014-09-26 NOTE — Consult Note (Signed)
   [facility] CM Inpatient Consult   09/26/2014  Gabriel Wood October 15, 1923 [id number] Referral received to assess for care management services for HF exacerbation and re-admission follow up. Met with the patient at bedside. Explained to the patient that [facility] is a covered benefit of insurance and the benefits of [facility] services when returning home. Review information for [facility] and a brochure was provided with contact information.  Explained that [facility] does not interfere with or replace any services arranged by the inpatient care management staff.  Patient declined services with [facility].  Patient states, "I have 24/7 coverage at home with my son and his wife, I have my medicines.  This old fluid just jump back up on me but I can't see any needs right for anyone coming or calling me.  The folks from [facility] calls me as well so I think I am pretty much covered for now.  My fluid is all gone now and I feel a lot better but, thank you for letting me know about your program."  Patient took the brochure and contact information. For questions, please contact: Natividad Brood, RN BSN [facility] Liaison  [phone] business mobile phone

## 2014-09-26 NOTE — Discharge Summary (Signed)
Physician Discharge Summary   Patient ID: Gabriel Wood MRN: [id number] DOB/AGE: November 19, 1923 81 y.o.  Admit date: 09/24/2014 Discharge date: 09/26/2014  Primary Care Physician:  Simona Huh, MD  Discharge Diagnoses:    . Acute on chronic respiratory failure with hypoxia   Moderate mitral regurgitation, moderate aortic stenosis    Metastatic melanoma to lungs    Emphysema with COPD exacerbation    Mild pulmonary hypertension  . Hypertension . Hyperlipidemia . 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 CAD (coronary artery disease) . Atrial fibrillation  Consults: None   Recommendations for Outpatient Follow-up:  Patient was recommended to continue Lasix daily rather than as needed  Home O2 evaluation prior to discharge  Pulmonology appointment was arranged, may need PFTs for further workup, has emphysema and possibly radiation pneumonitis/fibrosis due to prior XRT   TESTS THAT NEED FOLLOW-UP PFTs, BMET for potassium and renal function, CBC   DIET: Heart healthy diet    Allergies:   Allergies  Allergen Reactions  . Morphine And Related Palpitations  . Codeine Nausea Only  . Buprenorphine Hcl Palpitations     Discharge Medications:   Medication List    STOP taking these medications        potassium chloride SA 20 MEQ tablet  Commonly known as:  K-DUR,KLOR-CON      TAKE these medications        albuterol 108 (90 BASE) MCG/ACT inhaler  Commonly known as:  PROVENTIL HFA;VENTOLIN HFA  Inhale 2 puffs into the lungs every 6 (six) hours as needed for wheezing or shortness of breath.     aspirin EC 81 MG tablet  Take 81 mg by mouth daily.     atorvastatin 40 MG tablet  Commonly known as:  LIPITOR  Take 40 mg by mouth at bedtime.     budesonide-formoterol 160-4.5 MCG/ACT inhaler  Commonly known as:  SYMBICORT  Inhale 2 puffs into the lungs 2 (two) times daily.     citalopram 10 MG tablet  Commonly known as:  CELEXA  Take  10 mg by mouth daily.     [employer] NAUSEA RELIEF PO  Take 1 tablet by mouth 2 (two) times daily as needed (nausea).     diphenhydrAMINE 25 MG tablet  Commonly known as:  BENADRYL  Take 25 mg by mouth 3 (three) times daily.     fluticasone 50 MCG/ACT nasal spray  Commonly known as:  FLONASE  Place 1 spray into both nostrils daily.     furosemide 20 MG tablet  Commonly known as:  LASIX  Take 1 tablet (20 mg total) by mouth daily.     magnesium oxide 400 (241.3 MG) MG tablet  Commonly known as:  MAG-OX  Take 400 mg by mouth daily.     meclizine 25 MG tablet  Commonly known as:  ANTIVERT  Take 25 mg by mouth daily.     metoprolol succinate 25 MG 24 hr tablet  Commonly known as:  TOPROL-XL  Take 12.5 mg by mouth daily.     multivitamin with minerals Tabs tablet  Take 1 tablet by mouth at bedtime. Men's One A Day     nitroGLYCERIN 0.4 MG SL tablet  Commonly known as:  NITROSTAT  Place 0.4 mg under the tongue every 5 (five) minutes as needed for chest pain.     omeprazole 40 MG capsule  Commonly known as:  PRILOSEC  Take 40 mg by mouth daily.     OXYGEN  Inhale 2 % into the lungs at bedtime. daily as needed. Frequency:PHARMDIR   Dosage:0.0     Instructions:  Note:Diagnosis: COPD HT:5'9 WT: 89KG Home O2 @ 2LPM via [place], as needed to keep O2 >92%. Concentrator, supplies, & Portable tank for transport. Sats on RA @ rest     polyethylene glycol packet  Commonly known as:  MIRALAX / GLYCOLAX  Take 17 g by mouth daily before supper. Mix in 8 oz of liquid and drink     predniSONE 5 MG tablet  Commonly known as:  DELTASONE  Take 5 mg by mouth daily.     tiotropium 18 MCG inhalation capsule  Commonly known as:  SPIRIVA  Place 18 mcg into inhaler and inhale daily.         Brief H and P: For complete details please refer to admission H and P, but in brief Patient is a 81 year old male with IBS, constipation, CAD, chronic CHF, COPD, metastatic melanoma, hypertension, hyperlipidemia  presented to ED with shortness of breath. Patient was recently discharged on 09/12/14 when he had presented with shortness of breath and went to extensive workup including 2-D echo, CTA of the chest. Patient was diuresed with IV Lasix, he was able to work well with physical therapy and went home with home PT. Patient reports that he has been getting off and on shortness of breath and has been taking Lasix daily for the past 3 days. He also reported constipation issues.   Hospital Course:  Acute on chronic respiratory failure with hypoxia- much improving,, O2 sats 95% on 2 L., No wheezing. Patient had an extensive workup during the previous admission, was discharged on Lasix as needed, however patient was taking daily in the last 3 days prior to admission.  - Recent 2-D echo 6/16 had shown EF of 50-55%, no regional wall motion abnormalities, moderate aortic stenosis - CT angiogram of the chest on 6/6 showed no PE, pulmonary edema, atypical infection cannot be ruled out Patient was placed on IV Lasix for diuresis, BNP was 174 at the time of admission, troponin negative, changed to oral Lasix daily at the time of discharge. Please check BMET for potassium and renal function at the time of follow-up appointment. He also received Solu-Medrol 1 in ED. Patient was continued on albuterol, Symbicort while inpatient. Patient has history of emphysema due to prior smoking history and metastatic melanoma to the lungs, has received radiation treatment in the past. He has developed mild pulmonary hypertension and possibly has radiation pneumonitis/fibrosis. He will benefit from PFTs and follow-up with pulmonology. Home O2 evaluation will be done prior to discharge. Patient will continue prednisone 5 mg daily.   Atrial fibrillation -Currently rate controlled - Currently on aspirin, not on any anticoagulation outpatient   CAD (coronary artery disease) - Currently no chest pain or shortness of breath, continue  aspirin, beta blocker, Lipitor, Lasix  Hypertension - Currently stable, continue Lasix, metoprolol   Hyperlipidemia - Continue Lipitor  COPD with underlying metastatic melanoma to the lungs - Continue albuterol, Symbicort, O2, prednisone  Chronic anemia  - H&H currently at baseline, follow closely   Patient's family and patient declined PT evaluation or home health.  Day of Discharge BP 121/71 mmHg  Pulse 71  Temp(Src) 97.6 F (36.4 C) (Oral)  Resp 16  Ht 5\' 8"  (1.727 m)  Wt 69.491 kg (153 lb 3.2 oz)  BMI 23.30 kg/m2  SpO2 95%  Physical Exam: General: Alert and awake oriented x3 not in any acute  distress. HEENT: anicteric sclera, pupils reactive to light and accommodation CVS: S1-S2 clear no murmur rubs or gallops Chest: clear to auscultation bilaterally, no wheezing rales or rhonchi Abdomen: soft nontender, nondistended, normal bowel sounds Extremities: no cyanosis, clubbing or edema noted bilaterally Neuro: Cranial nerves II-XII intact, no focal neurological deficits   The results of significant diagnostics from this hospitalization (including imaging, microbiology, ancillary and laboratory) are listed below for reference.    LAB RESULTS: Basic Metabolic Panel:  Recent Labs Lab 09/25/14 0347 09/26/14 0445  NA 135 136  K 4.2 3.7  CL 103 100*  CO2 25 29  GLUCOSE 150* 93  BUN 18 18  CREATININE 0.94 0.91  CALCIUM 9.0 8.9   Liver Function Tests: No results for input(s): AST, ALT, ALKPHOS, BILITOT, PROT, ALBUMIN in the last 168 hours. No results for input(s): LIPASE, AMYLASE in the last 168 hours. No results for input(s): AMMONIA in the last 168 hours. CBC:  Recent Labs Lab 09/24/14 2055 09/24/14 2304  WBC 9.5  --   HGB 10.7* 11.9*  HCT 35.0* 35.0*  MCV 72.9*  --   PLT 252  --    Cardiac Enzymes: No results for input(s): CKTOTAL, CKMB, CKMBINDEX, TROPONINI in the last 168 hours. BNP: Invalid input(s): POCBNP CBG: No results for input(s):  GLUCAP in the last 168 hours.  Significant Diagnostic Studies:  Dg Chest Port 1 View  09/24/2014   CLINICAL DATA:  Increased shortness of breath over the past 2 weeks. Seen for same on June 5th. Nausea and hot flashes. Home oxygen.  EXAM: PORTABLE CHEST - 1 VIEW  COMPARISON:  09/19/2014  FINDINGS: Postoperative changes in the mediastinum. Shallow inspiration with elevation of the right hemidiaphragm. Diffuse interstitial pattern to the lungs has been present previously and may represent chronic interstitial process such as fibrosis. Superimposed infection or edema could also have this appearance. Normal heart size and pulmonary vascularity. No blunting of costophrenic angles. No pneumothorax. Calcified and tortuous aorta. Postoperative changes in the right neck base and left shoulder. Similar appearance to prior study.  IMPRESSION: Diffuse interstitial pattern to the lungs suggesting chronic process possibly with superimposed acute interstitial edema or infiltration. No focal consolidation.   Electronically Signed   By: Lucienne Capers M.D.   On: 09/24/2014 21:14    2D ECHO: Study Conclusions  - Left ventricle: The cavity size was normal. There was moderate concentric hypertrophy. Systolic function was normal. The estimated ejection fraction was in the range of 50% to 55%. Wall motion was normal; there were no regional wall motion abnormalities. - Aortic valve: Valve mobility was moderately restricted. There was moderate stenosis. Valve area (VTI): 1 cm^2. Valve area (Vmax): 1.05 cm^2. Valve area (Vmean): 0.98 cm^2. - Mitral valve: There was moderate regurgitation. - Left atrium: The atrium was severely dilated. - Right ventricle: The cavity size was mildly dilated. Wall thickness was normal. - Right atrium: The atrium was moderately dilated. - Pulmonary arteries: Systolic pressure was mildly increased. PA peak pressure: 38 mm Hg (S).  Disposition and Follow-up:      Discharge Instructions    (HEART FAILURE PATIENTS) Call MD:  Anytime you have any of the following symptoms: 1) 3 pound weight gain in 24 hours or 5 pounds in 1 week 2) shortness of breath, with or without a dry hacking cough 3) swelling in the hands, feet or stomach 4) if you have to sleep on extra pillows at night in order to breathe.    Complete by:  As directed      Diet - low sodium heart healthy    Complete by:  As directed      Increase activity slowly    Complete by:  As directed             DISPOSITION: home    DISCHARGE FOLLOW-UP Follow-up Information    Follow up with Simona Huh, MD. Schedule an appointment as soon as possible for a visit in 10 days.   Specialty:  Family Medicine   Why:  for hospital follow-up, obtain labs   Contact information:   [address] [place] [address] [phone]       Follow up with Noralee Space, MD On 10/03/2014.   Specialty:  Pulmonary Disease   Why:  at 11:30AM    Contact information:   [place] [address] [phone]        Time spent on Discharge: 35 mins   Signed:   Sayra Frisby M.D. [facility] 09/26/2014, 9:43 AM Pager: [phone]

## 2014-09-26 NOTE — Progress Notes (Signed)
[facility] called for delivery of home 02 to be delivered to room today prior to discharging home today; Aneta Mins [phone]

## 2014-09-26 NOTE — Progress Notes (Signed)
SATURATION QUALIFICATIONS: (This note is used to comply with regulatory documentation for home oxygen)  Patient Saturations on Room Air at Rest = 91%  Patient Saturations on Room Air while Ambulating = 84%  Patient Saturations on 2 Liters of oxygen while Ambulating = 100%

## 2014-09-30 ENCOUNTER — Ambulatory Visit (HOSPITAL_COMMUNITY)
Admission: RE | Admit: 2014-09-30 | Discharge: 2014-09-30 | Disposition: A | Discharge status: Home / Self Care | Payer: Medicare Other | Source: Ambulatory Visit | Attending: Specialist | Admitting: Specialist

## 2014-09-30 ENCOUNTER — Encounter (HOSPITAL_COMMUNITY): Payer: Self-pay

## 2014-09-30 DIAGNOSIS — N200 Calculus of kidney: Secondary | ICD-10-CM | POA: Insufficient documentation

## 2014-09-30 DIAGNOSIS — C7931 Secondary malignant neoplasm of brain: Secondary | ICD-10-CM | POA: Insufficient documentation

## 2014-09-30 DIAGNOSIS — C7951 Secondary malignant neoplasm of bone: Secondary | ICD-10-CM | POA: Diagnosis not present

## 2014-09-30 DIAGNOSIS — Z08 Encounter for follow-up examination after completed treatment for malignant neoplasm: Secondary | ICD-10-CM | POA: Diagnosis present

## 2014-09-30 DIAGNOSIS — C439 Malignant melanoma of skin, unspecified: Secondary | ICD-10-CM | POA: Insufficient documentation

## 2014-09-30 DIAGNOSIS — K7689 Other specified diseases of liver: Secondary | ICD-10-CM | POA: Diagnosis not present

## 2014-09-30 DIAGNOSIS — C7801 Secondary malignant neoplasm of right lung: Secondary | ICD-10-CM | POA: Insufficient documentation

## 2014-09-30 DIAGNOSIS — Z923 Personal history of irradiation: Secondary | ICD-10-CM | POA: Diagnosis not present

## 2014-09-30 DIAGNOSIS — Z9221 Personal history of antineoplastic chemotherapy: Secondary | ICD-10-CM | POA: Insufficient documentation

## 2014-09-30 DIAGNOSIS — J439 Emphysema, unspecified: Secondary | ICD-10-CM | POA: Diagnosis not present

## 2014-09-30 MED ORDER — IOHEXOL 300 MG/ML  SOLN
100.0000 mL | Freq: Once | INTRAMUSCULAR | Status: AC | PRN
  Administered 2014-09-30: 100 mL via INTRAVENOUS

## 2014-10-03 ENCOUNTER — Ambulatory Visit (INDEPENDENT_AMBULATORY_CARE_PROVIDER_SITE_OTHER): Payer: Medicare Other | Admitting: Pulmonary Disease

## 2014-10-03 ENCOUNTER — Encounter: Payer: Self-pay | Admitting: Pulmonary Disease

## 2014-10-03 VITALS — BP 102/60 | HR 84 | Temp 97.1°F | Wt 157.6 lb

## 2014-10-03 DIAGNOSIS — J9621 Acute and chronic respiratory failure with hypoxia: Secondary | ICD-10-CM | POA: Diagnosis not present

## 2014-10-03 DIAGNOSIS — C439 Malignant melanoma of skin, unspecified: Secondary | ICD-10-CM

## 2014-10-03 DIAGNOSIS — J438 Other emphysema: Secondary | ICD-10-CM

## 2014-10-03 DIAGNOSIS — I714 Abdominal aortic aneurysm, without rupture, unspecified: Secondary | ICD-10-CM

## 2014-10-03 DIAGNOSIS — I4819 Other persistent atrial fibrillation: Secondary | ICD-10-CM

## 2014-10-03 DIAGNOSIS — I481 Persistent atrial fibrillation: Secondary | ICD-10-CM

## 2014-10-03 DIAGNOSIS — I2510 Atherosclerotic heart disease of native coronary artery without angina pectoris: Secondary | ICD-10-CM | POA: Diagnosis not present

## 2014-10-03 DIAGNOSIS — C799 Secondary malignant neoplasm of unspecified site: Secondary | ICD-10-CM

## 2014-10-03 DIAGNOSIS — J439 Emphysema, unspecified: Secondary | ICD-10-CM | POA: Insufficient documentation

## 2014-10-03 DIAGNOSIS — I10 Essential (primary) hypertension: Secondary | ICD-10-CM

## 2014-10-03 DIAGNOSIS — I38 Endocarditis, valve unspecified: Secondary | ICD-10-CM

## 2014-10-03 NOTE — Patient Instructions (Signed)
Gabriel Wood- it was great meeting you today...  We discussed your lung condition & decided to keep your inhalers the same for now...    Increase your Oxygen to 3L/min 24/7...    We will get [facility] to bring a humidifier unit for your machine...    You may also use a nasal saline mist as needed...  We gave you an INCENTIVE SPIROMETER to use as a lung exerciser...    Take several good deep breaths in a row to expand your lungs & do this as often as practical...  We will arrange for a "sniff test" to check your right diaphramatic motion...  Call for any questions...  Let's plan a follow up visit in 37month, sooner if needed for problems.Marland KitchenMarland Kitchen

## 2014-10-04 ENCOUNTER — Encounter: Payer: Self-pay | Admitting: Pulmonary Disease

## 2014-10-04 NOTE — Progress Notes (Signed)
Subjective:     Patient ID: Gabriel Wood, male   DOB: 12/22/1923, 81 y.o.   MRN: [id number]  HPI 81 y/o WM, referred by Dr. Gaynelle Arabian Sadie Haber at [facility]), for a pulmonary evaluation due to dyspnea>   Hx malignant melanoma w/ mets to lung, brain, and bone>  He has been treated w/ surg, XRT, & immunotherapy- now off all treatments for the last 1mohe says;  Last Oncology f/u visit was 12/2013 w/ DrMohamed [facility] [place]>  Initial Melanoma removed 9/13, then he developed recurrent dis in the neck w/ right neck dissection 2/14; next he had right post neck mass excised 4/14 (residual melanoma) & this was followed by XRT;  [facility] last seen by DrMoschos 08/01/14 when completing 153yrherapy w/ pembrolizumab and his CT Chest/Abd/Pelvis was stable and recent MRI Brain showed further reduction in size of the brain lesion; they decided to stop therapy & follow a watchful waiting protocol w/ repeat scans in 3 mo (due 7/16)...   He has been noting deterioration in his stamina and breathing for the past year- some of which was thought to be from his AFib;  [facility] DrEliezer Mccoylast note from 06/20/14 is reviewed in Care Everywhere> Hx COPD/ emphysema on Symbicort, Spiriva, Albuterol prn; also taking Flonase which he says really helped his breathing; note indicates that he had mult admissions in 2014 for SOB that improved w/ diuresis, initiation of treatment for COPD, & aggressive pulm toilet; treated for HCAP & infected bleb w/ IV antibiotics; he was also started on O2 at that time for desat w/ exercise...    He is an ex-smoker having started in his teens, smoked for ~5519yrp to 1ppd, & quit in 1998 when he started having breathing problems...     He lives w/ his son & DIL, notes SOB w/ ADLs including bathing, dressing, walking about the house, etc;   He rests fair- sleeps in recliner, freq arousals, wakes tired, etc...     Current Pulm Meds> O2 at 2L/min 24/7, Pred5mg34m(per Onclogy), Symbicort160-2Bid, Spiriva daily, Flonase, AlbutHFA prn (ave once daily)...   Old CXRs>  Right hemidiaph elev since 07/2012 films & last normal CXR w/o right hemidiaph elev was 01/2010...  PFT 10/15 showed FVC=1.62 (55%), FEV1=1.11 (56%), %1sec=69, mid-flows reduced at 49% predicted; DLCO reported at 33%...   Gabriel Wood was recently Hosp 6/5 - 09/13/14 and again 6/18 - 09/26/14 by [facility] w/ acute on chronic hypoxemic resp failure, known metastatic malignant melanoma to his lungs, underlying COPD/emphysema, and mild pulm hypertension;  He has been on Home O2 at 2L/min [place] along w/ Symbicort160-2spBid, Spiriva daily, Pred 5mg/35mAlbutHFA rescue inhaler & Flonase qhs;  In addition he has HBP, CAD, CHF, AFib, & valvular heart dis w/ modAS & modMR> all followed by DrTilley on ASA81, MetopER25- 1/2 tab daily, Lasix20mg/62mHe was diuresed and improved after the 1st hospitalization (CXR showed vasc congestion & CT suggested pulm edema)=> got readmitted for incr SOB & EPIC review indicates more of the same & disch on Lasix daily;  He is also followed by [facility]- DrBrabham for carotid art dis w/ hx TIA (2014) prev on Pradaxa but this was stopped due to dx of brain met; Carotid duplex 1/16 showed <40% bilat ICA stenoses;  He has a known small AAA (2.8cm=>no change on  Duplex 1/16) and no claudication...  EXAM showed Afeb, VSS, O2sat=80% on RA, 97% on 2L/min;  HEENT- neg, mallampati2;  Chest- decr BS at right base w/o w/r/r;  Heart- irreg, gr2/6SEM w/o r/g heard;  Abd- soft, neg;  Ext- w/o c/c/e...  EKG 09/2014 showed AFib, rate95, IRBBB, NSSTTWA...  2DEcho 09/2014 showed mod conc LVH, norm LVF w/ EF=50-55%, no regional wall motion abn, modAS, modMR, severe LAdil, mod RAdil, mild RVdil, PAsys est~41mHg...  CT Angio Chest 09/11/14 showed neg for PE, dense atherosclerotic calcif, s/pCABG,  elev right hemidiaph w/ atx, emphysema & ?edema on left  CXR 09/19/14 showed mild cardiomeg, atherosclerotic calcif, s/pCABG; chr elev right hemidiaph, incr markings L>R (new from 06/2013- ?asymmetric edema?); left shoulder prosthesis...   CT Chest Abd Pelvis 09/30/14 showed cardiomeg/ prior CABG, & dense atherosclerotic calcif, stable lymphadenopathy (1.1cm R paratrach node- prev 1.0cm); lungs show paraseptal emphysema & scat interstitial accentuation w/ faint nodularity which is increased from prev w/ new 958mLUL nodule seen; right hemidiaph is elev w/ some atx; compression T12; ABD shows essent stable right adrenal/ left iliac wing/ left infrarenal soft tissue lesions (see report)...   LABS 6/16:  [place]- ok, BNP=175;  CBC- mild anemia w/ Hg=10.7-11.9, MCV=73;   Spirometry 10/03/14 showed FVC=1.45 (38%), FEV1=1.16 (41%), %1sec=80, mid-flows are reduced at 54% predicted;  c/w a restrictive pattern + small airways dis (need lung vol determination to confirm).  Ambulatory oxygen saturation test> O2sat=97% on O2 at 3L/min w/ pulse=84/min; he walked one lap w/ O2sat=91& on the 3L/min w/ pulse=92/min...  "Sniff test" 10/06/14 confirmed the presence of a paralyzed right hemidiaph...   IMP/PLAN>>  Gabriel Wood has combined chronic obstructive lung dis/ emphysema and restrictive dis from ?chest wall factors and elev/ paralyzed right hemidiaphragm- he is oxygen dependent at 2-3L/min 24/7 and ok to continue his Pred5, Symbicort160-2spBid, Spiriva daily, AlbutHFA, Flonase; also rec to use an Incentive Spirometer to aide in expanding lung bases... He has signif superimposed cardiac issues> HBP, CAD- s/pCABG, CHF, AFib, valv heart dis w/ modAS & modMR and mild pulmHTN> followed by DrTilley on ASA81, MetopER25-1/2tab daily, Lasix20, Atorva40...  He has stage IV melanoma which he has been battling for 2y10yrow> off immunotherapy since Apr2016, [facility]... Finally he is noted to be  anemic w/ hypochromic cells, needs Fe levels checked and treated per PCP or Oncologists...     Past Medical History  Diagnosis Date  . History of colon polyps   . Anemia   . Ulcer     history of Peptic Ulcer Disease  . IBS (irritable bowel syndrome)   . Anxiety   . BPH (benign prostatic hypertrophy)   . CAD (coronary artery disease)   . Atrial fibrillation   . AAA (abdominal aortic aneurysm)   . Chronic anticoagulation     On Coumadin therapy ( A Fib)  . Hypertension   . Hyperlipidemia   . History of DVT of lower extremity     pt does not recall this hx on 07/22/2013  . Anginal pain   . Transient ischemic attack (TIA) 07/2012  . Fall at home Dec. 2014  . CHF (congestive heart failure)   . COPD (chronic obstructive pulmonary disease)   . History of radiation therapy 06/30/13 completed      5/5 srs brain  . Family history of anesthesia complication     "son goes bezerk" (07/22/2013)  . On home oxygen therapy     "2L; w/activity only recently &  prn shortness of breath" (07/22/2013)  . H/O hiatal hernia   . Arthritis   . Kidney stone   . GERD (gastroesophageal reflux disease)   . Stroke 2015    "weakness on right side since"  . S/P radiation therapy 06/24/13-06/30/13    Left iliac 20Gy/54f  . Melanoma Sept. 2013    ,Right  posterior shoulder  . Metastatic melanoma     /notes 07/22/2013    Past Surgical History  Procedure Laterality Date  . Appendectomy  1957  . Cholecystectomy  1973  . Shoulder open rotator cuff repair Bilateral     "twice on the right; once on the left"  . Coronary artery bypass graft  1999    CABG X4  . Tibia fracture surgery Left 2011    tibial ORIF by Dr. DSharol Given . Ankle fusion Left 2011  . Lymph node biopsy    . Total shoulder replacement Left Dec.  2014  . Kidney stone surgery      "once"  . Joint replacement    . Cystoscopy w/ stone manipulation      "3-4 times"  . Tonsillectomy  1957  . Melanoma excision Right 05/2011; ~ 12/2011; 2014     shoulder;  neck; neck    Outpatient Encounter Prescriptions as of 10/03/2014  Medication Sig  . albuterol (PROVENTIL HFA;VENTOLIN HFA) 108 (90 BASE) MCG/ACT inhaler Inhale 2 puffs into the lungs every 6 (six) hours as needed for wheezing or shortness of breath.  .Marland Kitchenaspirin EC 81 MG tablet Take 81 mg by mouth daily.  .Marland Kitchenatorvastatin (LIPITOR) 40 MG tablet Take 40 mg by mouth at bedtime.   . budesonide-formoterol (SYMBICORT) 160-4.5 MCG/ACT inhaler Inhale 2 puffs into the lungs 2 (two) times daily.  . citalopram (CELEXA) 10 MG tablet Take 10 mg by mouth daily.   . diphenhydrAMINE (BENADRYL) 25 MG tablet Take 25 mg by mouth 3 (three) times daily.  . fluticasone (FLONASE) 50 MCG/ACT nasal spray Place 1 spray into both nostrils daily.   . Fructose-Dextrose-Phosphor Acd ([employer] NAUSEA RELIEF PO) Take 1 tablet by mouth 2 (two) times daily as needed (nausea).  . furosemide (LASIX) 20 MG tablet Take 1 tablet (20 mg total) by mouth daily.  . magnesium oxide (MAG-OX) 400 (241.3 MG) MG tablet Take 400 mg by mouth daily.   . meclizine (ANTIVERT) 25 MG tablet Take 25 mg by mouth daily.   . metoprolol succinate (TOPROL-XL) 25 MG 24 hr tablet Take 12.5 mg by mouth daily.  . Multiple Vitamin (MULTIVITAMIN WITH MINERALS) TABS Take 1 tablet by mouth at bedtime. Men's One A Day  . nitroGLYCERIN (NITROSTAT) 0.4 MG SL tablet Place 0.4 mg under the tongue every 5 (five) minutes as needed for chest pain.  .Marland Kitchenomeprazole (PRILOSEC) 40 MG capsule Take 40 mg by mouth daily.  . OXYGEN-HELIUM IN Inhale 2 % into the lungs at bedtime. daily as needed. Frequency:PHARMDIR   Dosage:0.0     Instructions:  Note:Diagnosis: COPD HT:5'9 WT: 89KG Home O2 @ 2LPM via [place], as needed to keep O2 >92%. Concentrator, supplies, & Portable tank for transport. Sats on RA @ rest  . polyethylene glycol (MIRALAX / GLYCOLAX) packet Take 17 g by mouth daily before supper. Mix in 8 oz of liquid and drink  . predniSONE (DELTASONE) 5 MG tablet Take 5 mg by  mouth daily.  .Marland Kitchentiotropium (SPIRIVA) 18 MCG inhalation capsule Place 18 mcg into inhaler and inhale daily.   No facility-administered encounter medications on  file as of 10/03/2014.    Allergies  Allergen Reactions  . Morphine And Related Palpitations  . Codeine Nausea Only  . Buprenorphine Hcl Palpitations    Immunization History  Administered Date(s) Administered  . Influenza-Unspecified 02/01/2014  . Pneumococcal Conjugate-13 09/12/2014  Pneumovax-23 given 2013 per [facility] notes   Family History  Problem Relation Age of Onset  . Cancer Mother     stomach cancer  . Heart disease Brother     After age 97  . Cancer Brother     Brain Tumor  . Hyperlipidemia Brother   . Hypertension Brother   . Heart disease Brother   . Hyperlipidemia Brother   . Hypertension Brother   . Heart disease Brother   . Hyperlipidemia Brother   . Hypertension Brother   . Hyperlipidemia Sister   . Cancer Daughter     Colon  . Hyperlipidemia Daughter   . Hyperlipidemia Son     History   Social History  . Marital Status: Widowed    Spouse Name: N/A  . Number of Children: N/A  . Years of Education: N/A   Occupational History  . Not on file.   Social History Main Topics  . Smoking status: Former Smoker -- 1.00 packs/day for 55 years    Types: Cigarettes    Quit date: 04/08/1996  . Smokeless tobacco: Never Used  . 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 Drug Use: No  . Sexual Activity: No   Other Topics Concern  . Not on file   Social History Narrative    Current Medications, Allergies, Past Medical History, Past Surgical History, Family History, and Social History were reviewed in [employer] record.   Review of Systems            All symptoms NEG except where BOLDED >>  Constitutional:  F/C/S, fatigue, anorexia, unexpected weight change. HEENT:  HA, visual changes, hearing loss,  earache, nasal symptoms, sore throat, mouth sores, hoarseness. Resp:  cough, sputum, hemoptysis; SOB, tightness, wheezing. Cardio:  CP, palpit, DOE, orthopnea, edema. GI:  N/V/D/C, blood in stool; reflux, abd pain, distention, gas. GU:  dysuria, freq, urgency, hematuria, flank pain, voiding difficulty. MS:  joint pain, swelling, tenderness, decr ROM; neck pain, back pain, etc. Neuro:  HA, tremors, seizures, dizziness, syncope, weakness, numbness, gait abn. Skin:  suspicious lesions or skin rash. Heme:  adenopathy, bruising, bleeding. Psyche:  confusion, agitation, sleep disturbance, hallucinations, anxiety, depression suicidal.   Objective:   Physical Exam      Vital Signs:  Reviewed...  General:  WD, WN, 81 y/o WM in NAD; alert & oriented; pleasant & cooperative... HEENT:  [place]/AT; Conjunctiva- pink, Sclera- nonicteric, EOM-wnl, PERRLA, EACs-clear, TMs-wnl; NOSE-clear; THROAT-clear & wnl. Neck:  Supple w/ fair ROM; no JVD; normal carotid impulses w/o bruits; no thyromegaly or nodules palpated; no lymphadenopathy. Chest:  decr BS right base without wheezes, rales, or rhonchi heard. Heart:  irreg rhythm; gr2/6 SEM without rubs or gallops detected. Abdomen:  Soft & nontender- no guarding or rebound; normal bowel sounds; no organomegaly or masses palpated. Ext:  decr ROM; without deformities +arthritic changes; no varicose veins, +venous insuffic, tr edema;  Pulses intact w/o bruits. Neuro:  No focal neuro drficits; gait unsteady but balance OK. Derm:  No lesions noted; no rash etc. Lymph:  No cervical, supraclavicular, axillary, or inguinal adenopathy palpated.   Assessment:  IMP >>  COPD/emphysema, ex-smoker>  Restrictive lung physiology from chest wall factors, elev right hemidiaph> Paralyzed right hemidiaph>  Stage IV malignant melanoma w/ lung, brain, & bone mets> Mild pulmonary HTN>  HBP, CAD- s/pCABG, CHF, AFib, valv heart dis w/ modAS & modMR> followed by DrTilley Anemia  w/ small cells, MCV=74>   PLAN >>  Gabriel Wood has combined chronic obstructive lung dis/ emphysema and restrictive dis from ?chest wall factors and elev/ paralyzed right hemidiaphragm- he is oxygen dependent at 2-3L/min 24/7 and ok to continue his Pred5, Symbicort160-2spBid, Spiriva daily, AlbutHFA, Flonase; also rec to use an Incentive Spirometer to aide in expanding lung bases... He has signif superimposed cardiac issues> HBP, CAD- s/pCABG, CHF, AFib, valv heart dis w/ modAS & modMR and mild pulmHTN> followed by DrTilley on ASA81, MetopER25-1/2tab daily, Lasix20, Atorva40...  He has stage IV melanoma which he has been battling for 58yr now> off immunotherapy since Apr2016, [facility]... Finally he is noted to be anemic w/ hypochromic cells, needs Fe levels checked and treated per PCP or Oncologists.     Plan:     Patient's Medications  New Prescriptions   No medications on file  Previous Medications   ALBUTEROL (PROVENTIL HFA;VENTOLIN HFA) 108 (90 BASE) MCG/ACT INHALER    Inhale 2 puffs into the lungs every 6 (six) hours as needed for wheezing or shortness of breath.   ASPIRIN EC 81 MG TABLET    Take 81 mg by mouth daily.   ATORVASTATIN (LIPITOR) 40 MG TABLET    Take 40 mg by mouth at bedtime.    BUDESONIDE-FORMOTEROL (SYMBICORT) 160-4.5 MCG/ACT INHALER    Inhale 2 puffs into the lungs 2 (two) times daily.   CITALOPRAM (CELEXA) 10 MG TABLET    Take 10 mg by mouth daily.    DIPHENHYDRAMINE (BENADRYL) 25 MG TABLET    Take 25 mg by mouth 3 (three) times daily.   FLUTICASONE (FLONASE) 50 MCG/ACT NASAL SPRAY    Place 1 spray into both nostrils daily.    FRUCTOSE-DEXTROSE-PHOSPHOR ACD ([employer] NAUSEA RELIEF PO)    Take 1 tablet by mouth 2 (two) times daily as needed (nausea).   FUROSEMIDE (LASIX) 20 MG TABLET    Take 1 tablet (20 mg total) by mouth daily.   MAGNESIUM OXIDE (MAG-OX) 400 (241.3 MG) MG TABLET    Take 400 mg by mouth daily.    MECLIZINE (ANTIVERT) 25  MG TABLET    Take 25 mg by mouth daily.    METOPROLOL SUCCINATE (TOPROL-XL) 25 MG 24 HR TABLET    Take 12.5 mg by mouth daily.   MULTIPLE VITAMIN (MULTIVITAMIN WITH MINERALS) TABS    Take 1 tablet by mouth at bedtime. Men's One A Day   NITROGLYCERIN (NITROSTAT) 0.4 MG SL TABLET    Place 0.4 mg under the tongue every 5 (five) minutes as needed for chest pain.   OMEPRAZOLE (PRILOSEC) 40 MG CAPSULE    Take 40 mg by mouth daily.   OXYGEN-HELIUM IN    Inhale 2 % into the lungs at bedtime. daily as needed. Frequency:PHARMDIR   Dosage:0.0     Instructions:  Note:Diagnosis: COPD HT:5'9 WT: 89KG Home O2 @ 2LPM via [place], as needed to keep O2 >92%. Concentrator, supplies, & Portable tank for transport. Sats on RA @ rest   POLYETHYLENE GLYCOL (MIRALAX / GLYCOLAX) PACKET    Take 17 g by mouth daily before supper. Mix in 8 oz of liquid and drink   PREDNISONE (  DELTASONE) 5 MG TABLET    Take 5 mg by mouth daily.   TIOTROPIUM (SPIRIVA) 18 MCG INHALATION CAPSULE    Place 18 mcg into inhaler and inhale daily.  Modified Medications   No medications on file  Discontinued Medications   No medications on file

## 2014-10-06 ENCOUNTER — Ambulatory Visit (HOSPITAL_COMMUNITY)
Admission: RE | Admit: 2014-10-06 | Discharge: 2014-10-06 | Disposition: A | Discharge status: Home / Self Care | Payer: Medicare Other | Source: Ambulatory Visit | Attending: Pulmonary Disease | Admitting: Pulmonary Disease

## 2014-10-06 DIAGNOSIS — J9621 Acute and chronic respiratory failure with hypoxia: Secondary | ICD-10-CM

## 2014-10-06 DIAGNOSIS — Z9981 Dependence on supplemental oxygen: Secondary | ICD-10-CM | POA: Insufficient documentation

## 2014-10-06 DIAGNOSIS — R0602 Shortness of breath: Secondary | ICD-10-CM | POA: Insufficient documentation

## 2014-10-06 DIAGNOSIS — J986 Disorders of diaphragm: Secondary | ICD-10-CM | POA: Insufficient documentation

## 2014-10-12 ENCOUNTER — Telehealth: Payer: Self-pay | Admitting: Pulmonary Disease

## 2014-10-12 NOTE — Telephone Encounter (Signed)
Per SN >> the mask would NOT be better, it's to cumbersome. It would be better to use the nasal cannula. A mask can't be used with pulsed oxygen anyway. He can use saline nasal spray every hour while he's awake to help with dry nasal passages.  Gabriel Wood is aware of the above information. She will call if there are any further issues.

## 2014-10-12 NOTE — Telephone Encounter (Signed)
Patients daughter says that humidifier was added to concentrator.  Dr. Lenna Gilford put patient on 3L, but it seemed too heavy, so they dropped his oxygen down to 2.5L.  Daughter wants to know if a face mask that covers the mouth and nose would benefit him better than the nasal cannula.  Daughter says that the humidity and allergies are causing him blockage through the nose and she doesn't feel like he is getting enough oxygen through his cannula.  Feeling fatigued, sob.  DOE.    To Dr. Lenna Gilford.

## 2014-10-13 ENCOUNTER — Ambulatory Visit: Payer: Medicare Other

## 2014-10-13 ENCOUNTER — Other Ambulatory Visit: Payer: Medicare Other

## 2014-11-01 ENCOUNTER — Other Ambulatory Visit: Payer: Medicare Other

## 2014-11-01 ENCOUNTER — Ambulatory Visit: Payer: Medicare Other

## 2014-11-03 ENCOUNTER — Ambulatory Visit: Payer: Medicare Other | Admitting: Pulmonary Disease

## 2014-11-07 ENCOUNTER — Encounter: Payer: Self-pay | Admitting: Radiation Therapy

## 2014-11-07 NOTE — Progress Notes (Signed)
Pt is now under [place] per his daughter Gus Height. I asked her to please call if he needed anything. She understands that now he is under hospice care there will be no future follow-ups or MRI appointments scheduled. She is good with this plan.   Mont Dutton R.T. (R) (T) [facility] [place] [phone] Office [phone] Pager [phone] Fax [email or web address]

## 2015-01-07 DEATH — deceased

## 2015-05-01 ENCOUNTER — Encounter (HOSPITAL_COMMUNITY): Payer: Medicare Other

## 2015-05-01 ENCOUNTER — Other Ambulatory Visit (HOSPITAL_COMMUNITY): Payer: Medicare Other

## 2015-05-08 ENCOUNTER — Ambulatory Visit: Payer: Medicare Other | Admitting: Family

## 2016-04-08 ENCOUNTER — Other Ambulatory Visit: Payer: Self-pay | Admitting: Nurse Practitioner

## 2017-08-01 ENCOUNTER — Encounter: Payer: Self-pay | Admitting: Radiation Therapy
